# Patient Record
Sex: Male | Born: 1955 | Race: White | Hispanic: No | Marital: Single | Smoking: Current every day smoker
Health system: Southern US, Community
[De-identification: ages and names within clinical notes are randomized; demographics above are authoritative.]

## PROBLEM LIST (undated history)

## (undated) DIAGNOSIS — Z87442 Personal history of urinary calculi: Secondary | ICD-10-CM

## (undated) DIAGNOSIS — I255 Ischemic cardiomyopathy: Secondary | ICD-10-CM

## (undated) DIAGNOSIS — E785 Hyperlipidemia, unspecified: Secondary | ICD-10-CM

## (undated) DIAGNOSIS — I10 Essential (primary) hypertension: Secondary | ICD-10-CM

## (undated) DIAGNOSIS — I4901 Ventricular fibrillation: Secondary | ICD-10-CM

## (undated) DIAGNOSIS — I219 Acute myocardial infarction, unspecified: Secondary | ICD-10-CM

## (undated) DIAGNOSIS — D649 Anemia, unspecified: Secondary | ICD-10-CM

## (undated) DIAGNOSIS — I2510 Atherosclerotic heart disease of native coronary artery without angina pectoris: Secondary | ICD-10-CM

## (undated) DIAGNOSIS — Z950 Presence of cardiac pacemaker: Secondary | ICD-10-CM

## (undated) DIAGNOSIS — G4733 Obstructive sleep apnea (adult) (pediatric): Secondary | ICD-10-CM

## (undated) DIAGNOSIS — I5022 Chronic systolic (congestive) heart failure: Secondary | ICD-10-CM

## (undated) DIAGNOSIS — J189 Pneumonia, unspecified organism: Secondary | ICD-10-CM

## (undated) HISTORY — DX: Hyperlipidemia, unspecified: E78.5

## (undated) HISTORY — PX: BACK SURGERY: SHX140

## (undated) HISTORY — DX: Ischemic cardiomyopathy: I25.5

## (undated) HISTORY — DX: Obstructive sleep apnea (adult) (pediatric): G47.33

## (undated) HISTORY — PX: KNEE ARTHROSCOPY: SHX127

## (undated) HISTORY — DX: Ventricular fibrillation: I49.01

## (undated) HISTORY — DX: Chronic systolic (congestive) heart failure: I50.22

## (undated) HISTORY — DX: Atherosclerotic heart disease of native coronary artery without angina pectoris: I25.10

---

## 1997-09-26 ENCOUNTER — Encounter: Admission: RE | Admit: 1997-09-26 | Discharge: 1997-12-25 | Payer: Self-pay | Admitting: Anesthesiology

## 1997-12-06 ENCOUNTER — Encounter: Payer: Self-pay | Admitting: Anesthesiology

## 2002-03-16 ENCOUNTER — Emergency Department (HOSPITAL_COMMUNITY): Admission: EM | Admit: 2002-03-16 | Discharge: 2002-03-16 | Payer: Self-pay | Admitting: Emergency Medicine

## 2002-03-29 ENCOUNTER — Encounter: Admission: RE | Admit: 2002-03-29 | Discharge: 2002-03-29 | Payer: Self-pay | Admitting: Sports Medicine

## 2002-05-30 ENCOUNTER — Encounter: Payer: Self-pay | Admitting: Neurological Surgery

## 2002-05-30 ENCOUNTER — Ambulatory Visit (HOSPITAL_COMMUNITY): Admission: RE | Admit: 2002-05-30 | Discharge: 2002-05-31 | Payer: Self-pay | Admitting: Neurological Surgery

## 2002-06-03 ENCOUNTER — Encounter: Payer: Self-pay | Admitting: Neurological Surgery

## 2002-06-03 ENCOUNTER — Ambulatory Visit (HOSPITAL_COMMUNITY): Admission: RE | Admit: 2002-06-03 | Discharge: 2002-06-03 | Payer: Self-pay | Admitting: Neurological Surgery

## 2002-08-04 ENCOUNTER — Encounter: Admission: RE | Admit: 2002-08-04 | Discharge: 2002-08-04 | Payer: Self-pay | Admitting: Sports Medicine

## 2002-08-04 ENCOUNTER — Encounter: Payer: Self-pay | Admitting: Neurological Surgery

## 2002-08-05 ENCOUNTER — Encounter: Payer: Self-pay | Admitting: Neurological Surgery

## 2002-08-05 ENCOUNTER — Encounter: Admission: RE | Admit: 2002-08-05 | Discharge: 2002-08-05 | Payer: Self-pay | Admitting: Neurological Surgery

## 2008-01-20 ENCOUNTER — Emergency Department (HOSPITAL_BASED_OUTPATIENT_CLINIC_OR_DEPARTMENT_OTHER): Admission: EM | Admit: 2008-01-20 | Discharge: 2008-01-20 | Payer: Self-pay | Admitting: Emergency Medicine

## 2008-12-20 ENCOUNTER — Encounter: Admission: RE | Admit: 2008-12-20 | Discharge: 2008-12-20 | Payer: Self-pay | Admitting: Orthopedic Surgery

## 2009-06-13 ENCOUNTER — Inpatient Hospital Stay (HOSPITAL_COMMUNITY)
Admission: RE | Admit: 2009-06-13 | Discharge: 2009-06-16 | Payer: Self-pay | Source: Home / Self Care | Admitting: Cardiovascular Disease

## 2009-09-04 ENCOUNTER — Ambulatory Visit: Payer: Self-pay | Admitting: Cardiovascular Disease

## 2010-01-13 HISTORY — PX: PACEMAKER INSERTION: SHX728

## 2010-01-24 ENCOUNTER — Encounter: Payer: Self-pay | Admitting: Internal Medicine

## 2010-01-25 ENCOUNTER — Ambulatory Visit: Admission: RE | Admit: 2010-01-25 | Payer: Self-pay | Source: Home / Self Care | Admitting: Cardiology

## 2010-02-22 DIAGNOSIS — R0602 Shortness of breath: Secondary | ICD-10-CM | POA: Insufficient documentation

## 2010-02-25 ENCOUNTER — Institutional Professional Consult (permissible substitution) (INDEPENDENT_AMBULATORY_CARE_PROVIDER_SITE_OTHER): Payer: BC Managed Care – PPO | Admitting: Internal Medicine

## 2010-02-25 ENCOUNTER — Encounter: Payer: Self-pay | Admitting: Internal Medicine

## 2010-02-25 DIAGNOSIS — I2589 Other forms of chronic ischemic heart disease: Secondary | ICD-10-CM

## 2010-02-25 DIAGNOSIS — I5022 Chronic systolic (congestive) heart failure: Secondary | ICD-10-CM

## 2010-03-06 NOTE — Assessment & Plan Note (Signed)
Summary: nep. eval for icd. pt has bcbs advantage.gd/per pt office 275...   Visit Type:  Initial Consult Referring Provider:  Dr Mayford Knife  CC:  New EP evaluation.  History of Present Illness: Joe [name] is a pleasant 55 yo WM with a h/o CAD, ischemic CM (EF 35%), and HTN who presents today for EP consultation regarding risk stratefication of sudde death.  He had acute STEMI with VF arrest requiring defibrillation 6/11.  He was evaluated at [facility] and underwent PCI of the mid LAD.  He has done reasonably well since that time.  He remains active but reports significant decrease in energy.  He reports decreased exercise tolerance with fatigue and inability to walk more than 1/4 mile due to chest tightness and mild SOB in October.  He feels that this is gradually improving.  Presently, he feels that he can walk 3/4 miles.  He has also had difficulty with fluid retention.  He denies CP, orthopnea, PND, edema, presyncope, or syncope.  He reports complaince with medicines.  He works as a Curator.   Problems Prior to Update: 1)  Hyperlipidemia-mixed  (ICD-272.4) 2)  Cardiomyopathy, Ischemic  (ICD-414.8) 3)  Dyspnea  (ICD-786.05) 4)  Cad, Native Vessel  (ICD-414.01)  Current Medications (verified): 1)  Aspirin Ec 325 Mg Tbec (Aspirin) .... Take One Tablet By Mouth Daily 2)  Lisinopril 5 Mg Tabs (Lisinopril) .... Take One Tablet By Mouth Daily 3)  Effient 10 Mg Tabs (Prasugrel Hcl) .... Take One Tablet By Mouth Daily 4)  Crestor 10 Mg Tabs (Rosuvastatin Calcium) .... Take One Tablet By Mouth Daily.  Allergies (verified): No Known Drug Allergies  Past History:  Past Medical History: CAD s/p PCI mild LAD (BMS) VF arrest in setting of AMI 6/11 Ischemic CM NYHA Class III CHF Hyperlipidemia ED tobacco use (quit)  Past Surgical History: back surgery x 2 R knee arthroscopy  Family History: mother died of an "aneurysm"  Social History: Pt lives in [place] alone.  Widowed.  2 grown  children.  Works as a Curator.  Tob- quit 07/13/09 previously 1 PPD x 30 years.  ETOH- 1 6pk/wk.  Denies drug.  Review of Systems       All systems are reviewed and negative except as listed in the HPI.   Vital Signs:  Patient profile:   55 year old male Height:      74 inches Weight:      228.50 pounds BMI:     29.44 Pulse rate:   68 / minute Pulse rhythm:   regular Resp:     18 per minute BP sitting:   126 / 84  (left arm) Cuff size:   large  Vitals Entered By: Vikki Ports (February 25, 2010 3:57 PM)  Physical Exam  General:  Well developed, well nourished, in no acute distress. Head:  normocephalic and atraumatic Eyes:  PERRLA/EOM intact; conjunctiva and lids normal. Mouth:  Teeth, gums and palate normal. Oral mucosa normal. Neck:  supple Lungs:  Clear bilaterally to auscultation and percussion. Heart:  Non-displaced PMI, chest non-tender; regular rate and rhythm, S1, S2 without murmurs, rubs or gallops. Carotid upstroke normal, no bruit. Normal abdominal aortic size, no bruits. Femorals normal pulses, no bruits. Pedals normal pulses. No edema, no varicosities. Abdomen:  Bowel sounds positive; abdomen soft and non-tender without masses, organomegaly, or hernias noted. No hepatosplenomegaly. Msk:  Back normal, normal gait. Muscle strength and tone normal. Extremities:  No clubbing or cyanosis. Neurologic:  Alert and oriented  x 3. Skin:  Intact without lesions or rashes. Psych:  Normal affect.  Cardiac Cath  Procedure date:  06/14/2009  Findings:       CONCLUSIONS:   1. Successful PTCA and stenting of the mid-LAD.   2. He has significant stenosis in the proximal circumflex artery.       This will need to be addressed at a different time.        Vesta Mixer, M.D.    EKG  Procedure date:  02/25/2010  Findings:      sinus rhythm 68 bpm, PR 162, QRS 118, QTc 416, anteroseptal MI  Nuclear Study  Procedure date:  01/25/2010  Findings:      fixed  anteroapical scar, small area of ischemia in setting of LCx, EF 36%  EF-Ejection Fraction  Procedure date:  11/14/2009  Findings:      anteroapical scar, EF 35%  Impression & Recommendations:  Problem # 1:  CARDIOMYOPATHY, ISCHEMIC (ICD-414.8) The patient has an ischemic CM (EF 35% by MUGA 11/14/09), NYHA Class III CHF, and CAD.  He meets MADIT II/ SCD-HeFT criteria for ICD implantation for primary prevention of sudden death.  He has also had a prior VF arrest in the setting of acute MI 6/11.  He is presently treated with an optimal medical regimen.  Risks, benefits, alternatives to ICD implantation were discussed in detail with the patient today.  He understands that the risks include but are not limited to bleeding, infection, pneumothorax, perforation, tamponade, vascular damage, renal failure, MI, stroke, death, inappropriate shocks, and lead dislodgement.  At this time, he would like to further contemplate this option.  He will discuss with Dr Mayford Knife and will contact my office if he wishes to proceed with ICD implantation in the future. If he decides to proceed with ICD, we will consider offering the patient enrollment in the Anaylze ST trial.  He is not a candidate for CRT.  complete ETOH abstinance is advised.  Problem # 2:  CAD, NATIVE VESSEL (ICD-414.01) no ischemic symptoms  Problem # 3:  HYPERLIPIDEMIA-MIXED (ICD-272.4) stable  His updated medication list for this problem includes:    Crestor 10 Mg Tabs (Rosuvastatin calcium) .Marland Kitchen... Take one tablet by mouth daily.  Other Orders: EKG w/ Interpretation (93000)

## 2010-03-26 NOTE — Letter (Signed)
Summary: [facility] 2010-2012  [facility] 2010-2012   Imported By: Marylou Mccoy 03/19/2010 12:06:27  _____________________________________________________________________  External Attachment:    Type:   Image     Comment:   External Document

## 2010-04-01 LAB — DIFFERENTIAL
Lymphs Abs: 1.4 10*3/uL (ref 0.7–4.0)
Monocytes Relative: 4 % (ref 3–12)
Neutro Abs: 12.7 10*3/uL — ABNORMAL HIGH (ref 1.7–7.7)
Neutrophils Relative %: 86 % — ABNORMAL HIGH (ref 43–77)

## 2010-04-01 LAB — CARDIAC PANEL(CRET KIN+CKTOT+MB+TROPI)
CK, MB: 278.5 ng/mL (ref 0.3–4.0)
Relative Index: 11.2 — ABNORMAL HIGH (ref 0.0–2.5)
Troponin I: 82.81 ng/mL (ref 0.00–0.06)

## 2010-04-01 LAB — BASIC METABOLIC PANEL
BUN: 12 mg/dL (ref 6–23)
Chloride: 104 mEq/L (ref 96–112)
Glucose, Bld: 109 mg/dL — ABNORMAL HIGH (ref 70–99)
Potassium: 4 mEq/L (ref 3.5–5.1)

## 2010-04-01 LAB — POCT I-STAT, CHEM 8
BUN: 14 mg/dL (ref 6–23)
Calcium, Ion: 1.12 mmol/L (ref 1.12–1.32)
Chloride: 102 mEq/L (ref 96–112)
Creatinine, Ser: 0.7 mg/dL (ref 0.4–1.5)
Glucose, Bld: 210 mg/dL — ABNORMAL HIGH (ref 70–99)
HCT: 44 % (ref 39.0–52.0)
Hemoglobin: 15 g/dL (ref 13.0–17.0)
Potassium: 2.9 mEq/L — ABNORMAL LOW (ref 3.5–5.1)
Sodium: 136 mEq/L (ref 135–145)
TCO2: 20 mmol/L (ref 0–100)

## 2010-04-01 LAB — CBC
Hemoglobin: 15.9 g/dL (ref 13.0–17.0)
MCHC: 34.4 g/dL (ref 30.0–36.0)
MCHC: 35 g/dL (ref 30.0–36.0)
MCV: 93.4 fL (ref 78.0–100.0)
RBC: 4.52 MIL/uL (ref 4.22–5.81)
RBC: 4.97 MIL/uL (ref 4.22–5.81)
RDW: 12.5 % (ref 11.5–15.5)
RDW: 12.6 % (ref 11.5–15.5)

## 2010-04-01 LAB — APTT: aPTT: 83 seconds — ABNORMAL HIGH (ref 24–37)

## 2010-04-01 LAB — COMPREHENSIVE METABOLIC PANEL
BUN: 14 mg/dL (ref 6–23)
Calcium: 8.2 mg/dL — ABNORMAL LOW (ref 8.4–10.5)
Glucose, Bld: 215 mg/dL — ABNORMAL HIGH (ref 70–99)
Sodium: 137 mEq/L (ref 135–145)
Total Protein: 5.6 g/dL — ABNORMAL LOW (ref 6.0–8.3)

## 2010-04-01 LAB — LIPID PANEL
Cholesterol: 219 mg/dL — ABNORMAL HIGH (ref 0–200)
Total CHOL/HDL Ratio: 5.2 RATIO
VLDL: 45 mg/dL — ABNORMAL HIGH (ref 0–40)

## 2010-04-01 LAB — CK TOTAL AND CKMB (NOT AT ARMC)
CK, MB: 56.6 ng/mL (ref 0.3–4.0)
Total CK: 1001 U/L — ABNORMAL HIGH (ref 7–232)

## 2010-04-01 LAB — TSH: TSH: 0.504 u[IU]/mL (ref 0.350–4.500)

## 2010-04-01 LAB — MRSA PCR SCREENING: MRSA by PCR: NEGATIVE

## 2010-04-01 LAB — PROTIME-INR: Prothrombin Time: 27.2 seconds — ABNORMAL HIGH (ref 11.6–15.2)

## 2010-05-31 NOTE — Op Note (Signed)
NAME:  Joe [name], Joe [name]                     ACCOUNT NO.:  [id number]   MEDICAL RECORD NO.:  [id number]                   PATIENT TYPE:  OIB   LOCATION:  2889                                 FACILITY:  [facility]   PHYSICIAN:  Stefani Dama, M.D.               DATE OF BIRTH:  July 03, 1955   DATE OF PROCEDURE:  05/30/2002  DATE OF DISCHARGE:                                 OPERATIVE REPORT   PREOPERATIVE DIAGNOSES:  Herniated nucleus pulposus L5-S1 right with right  lumbar radiculopathy.   POSTOPERATIVE DIAGNOSES:  Herniated nucleus pulposus L5-S1 right with right  lumbar radiculopathy.   PROCEDURE:  Microendoscopic diskectomy on right L5-S1 with operating  microscope, Metrex microdissection technique.   SURGEON:  Stefani Dama, M.D.   FIRST ASSISTANT:  Hilda Lias, M.D.   ANESTHESIA:  General endotracheal.   INDICATIONS:  The patient is a 55 year old individual who has had  significant back and right lower extremity pain.  The patient notes that he  has had increasing pain over the past six months' time without relief  despite conservative management including physical therapy, epidural steroid  injections, and nonsteroidal anti-inflammatories.  He also presented to the  office with a foot drop on that right foot.  He was advised regarding  surgical decompression of this lesion at the L5-S1 level with a disk  fragment above the level of the L5 vertebral body which appears to be  affecting the L5 nerve root in its exit foramen.  He is taken to the  operating room.   DESCRIPTION OF PROCEDURE:  The patient was brought to the operating room  supine on the stretcher.  After smooth induction of general endotracheal  anesthesia, he was turned prone, and the back was shaved, prepped with  DuraPrep and draped in a sterile fashion.  A K-wire was used to localize the  laminar arch of L5, and then using a wanding technique, a series of dilators  were passed over the arch, and the  dissection was carried down to place a 5-  cm deep x 18-mm wide endoscopic cannula over the L5-S1 interspace on the  right side.  The patient then had the tube fixed to the operating table with  a clamp.  The microscope was draped and brought into the field, and then  with monopolar cautery, the superficial tissues over the laminar arch were  dissected free.  A laminotomy was then created by removing the inferior  margin of the lamina of L5 out to the mesial wall of the facet.  The yellow  ligament was taken up, and the underlying dura was identified.  At the  lateral aspect just underneath the edge of the dura, the S1 nerve root could  be identified being tented dorsally.  This was dissected medially and  underlying this was a large fragment of disk material.  This was removed in  a singular piece. Several other fragments of disk  were noted to be trapped  more cephalad.  This was evidently causing some compression in the L5 nerve  root region.  This was decompressed in a piecemeal fashion using a  combination of micro-pituitaries and 2-mm Kerrison punches with a  microdissection technique and cautery of the epidural veins in this region.  In the end, the area was decompressed well.  The dissection was then carried  inferiorly.  Here, the ligament over the interspace was noted to be bowing  posteriorly.  However, it was solid and intact.  The superior aspect had a  small aperture from the disk space, and this was palpated free and clear of  any loose disk material.  Because the nature of the bulge posteriorly was  very small, it was felt that the disk space should not be opened, and this  was left intact.  The area was then checked for hemostasis in the soft  tissues.  The common dural tube, the L5, and the S1 nerve roots were noted  to be well decompressed on that right side.  With good hemostasis being  obtained, the area was copiously irrigated with antibiotic irrigating  solution, and  then the cannula was removed, and 3-0 Vicryl was placed in the  lumbodorsal fascia, and 3-0 Vicryl was used to close the subcuticular skin.  Dermabond was placed on the skin.  The patient tolerated the procedure well  and was returned to the recovery room in stable condition.                                               Stefani Dama, M.D.    Merla Riches  D:  05/30/2002  T:  05/30/2002  Job:  [id number]

## 2010-07-26 ENCOUNTER — Other Ambulatory Visit: Payer: Self-pay | Admitting: Cardiovascular Disease

## 2010-07-26 NOTE — Telephone Encounter (Signed)
Needs yearly ov, placed on script Alfonso Ramus RN

## 2010-10-11 ENCOUNTER — Other Ambulatory Visit: Payer: Self-pay | Admitting: *Deleted

## 2010-10-11 MED ORDER — ROSUVASTATIN CALCIUM 20 MG PO TABS: 20.0000 mg | ORAL_TABLET | Freq: Every day | ORAL | Status: DC

## 2010-10-11 NOTE — Telephone Encounter (Signed)
WILL NEED OV,

## 2010-11-05 ENCOUNTER — Other Ambulatory Visit: Payer: Self-pay | Admitting: Internal Medicine

## 2010-11-05 MED ORDER — ROSUVASTATIN CALCIUM 20 MG PO TABS: 20.0000 mg | ORAL_TABLET | Freq: Every day | ORAL | Status: DC

## 2011-01-08 ENCOUNTER — Encounter: Payer: Self-pay | Admitting: *Deleted

## 2011-01-08 ENCOUNTER — Encounter (HOSPITAL_COMMUNITY): Payer: Self-pay | Admitting: Pharmacy Technician

## 2011-01-08 ENCOUNTER — Ambulatory Visit (INDEPENDENT_AMBULATORY_CARE_PROVIDER_SITE_OTHER): Payer: BC Managed Care – PPO | Admitting: Internal Medicine

## 2011-01-08 VITALS — BP 138/88 | HR 78 | Ht 74.0 in | Wt 240.0 lb

## 2011-01-08 DIAGNOSIS — I5022 Chronic systolic (congestive) heart failure: Secondary | ICD-10-CM

## 2011-01-08 DIAGNOSIS — I2589 Other forms of chronic ischemic heart disease: Secondary | ICD-10-CM

## 2011-01-08 LAB — CBC WITH DIFFERENTIAL/PLATELET
Basophils Relative: 1 % (ref 0–1)
Eosinophils Absolute: 0.3 10*3/uL (ref 0.0–0.7)
Eosinophils Relative: 3 % (ref 0–5)
Hemoglobin: 16.4 g/dL (ref 13.0–17.0)
Lymphs Abs: 4.2 10*3/uL — ABNORMAL HIGH (ref 0.7–4.0)
MCH: 31.4 pg (ref 26.0–34.0)
MCHC: 34.6 g/dL (ref 30.0–36.0)
MCV: 90.8 fL (ref 78.0–100.0)
Monocytes Absolute: 0.6 10*3/uL (ref 0.1–1.0)
Monocytes Relative: 6 % (ref 3–12)
RBC: 5.22 MIL/uL (ref 4.22–5.81)

## 2011-01-08 LAB — BASIC METABOLIC PANEL
CO2: 24 mEq/L (ref 19–32)
Chloride: 108 mEq/L (ref 96–112)
Potassium: 4.3 mEq/L (ref 3.5–5.3)

## 2011-01-08 NOTE — Assessment & Plan Note (Signed)
He is s/p MI with an EF less than 30% by stress testing. He has a class 1 ICD indication (MADIT2). I have discussed the risks/benefits/goals/expectations of ICD implant with the patient and he wishes to proceed.

## 2011-01-08 NOTE — Progress Notes (Signed)
HPI Joe [name] is referred today for followup. He is a pleasant 55 yo man with a h/o an ICM, chronic systolic heart failure, HTN, and dyslipidemia. He is referred today for consideration for prophylactic ICD implant. His initial MI was over a year ago. His initial EF after MI was 35%. His subsequent EF on stress testing several weeks ago was 28%. He denies syncope and has never had peripheral edema. When he presented with his initial MI, he had a VF arrest. He has rare palpitations. No Known Allergies   Current Outpatient Prescriptions  Medication Sig Dispense Refill  . aspirin 81 MG tablet Take 81 mg by mouth daily.        . carvedilol (COREG) 6.25 MG tablet Take 6.25 mg by mouth 2 (two) times daily with a meal.        . DIOVAN 80 MG tablet Take 80 mg by mouth daily.       . prasugrel (EFFIENT) 10 MG TABS Take 10 mg by mouth daily.        . rosuvastatin (CRESTOR) 20 MG tablet Take 1 tablet (20 mg total) by mouth daily.  30 tablet  1     Past Medical History  Diagnosis Date  . Coronary artery disease     s/p PCI mild LAD (BMS)  . Hyperlipidemia   . Ischemic cardiomyopathy   . Congestive heart failure, NYHA class III   . VF (ventricular fibrillation)     arrest in setting of AMI 6/11    ROS:   All systems reviewed and negative except as noted in the HPI.   Past Surgical History  Procedure Date  . Back surgery   . Back surgery   . Knee arthroscopy     right     Family History  Problem Relation Age of Onset  . Aneurysm Mother      History   Social History  . Marital Status: Single    Spouse Name: N/A    Number of Children: N/A  . Years of Education: N/A   Occupational History  . Not on file.   Social History Main Topics  . Smoking status: Former Smoker -- 1.0 packs/day for 34 years    Types: Cigarettes    Quit date: 06/13/2009  . Smokeless tobacco: Never Used  . Alcohol Use: Not on file  . Drug Use: Not on file  . Sexually Active: Not on file   Other  Topics Concern  . Not on file   Social History Narrative  . No narrative on file     BP 138/88  Pulse 78  Ht 6' 2" (1.88 m)  Wt 108.863 kg (240 lb)  BMI 30.81 kg/m2  Physical Exam:  Well appearing middle age man, NAD HEENT: Unremarkable Neck:  No JVD, no thyromegally Lymphatics:  No adenopathy Back:  No CVA tenderness Lungs:  Clear with no wheezes, rales, or rhonchi. HEART:  Regular rate rhythm, no murmurs, no rubs, no clicks Abd:  soft, positive bowel sounds, no organomegally, no rebound, no guarding Ext:  2 plus pulses, no edema, no cyanosis, no clubbing Skin:  No rashes no nodules Neuro:  CN II through XII intact, motor grossly intact  EKG NSR with infero-anteroseptal MI.  Assess/Plan:   

## 2011-01-08 NOTE — Patient Instructions (Signed)

## 2011-01-09 ENCOUNTER — Encounter (HOSPITAL_COMMUNITY): Payer: Self-pay | Admitting: *Deleted

## 2011-01-09 ENCOUNTER — Other Ambulatory Visit: Payer: Self-pay | Admitting: *Deleted

## 2011-01-09 ENCOUNTER — Encounter (HOSPITAL_COMMUNITY)
Admission: RE | Disposition: A | Discharge status: Home / Self Care | Payer: Self-pay | Source: Ambulatory Visit | Attending: Internal Medicine

## 2011-01-09 ENCOUNTER — Ambulatory Visit (HOSPITAL_COMMUNITY)
Admission: RE | Admit: 2011-01-09 | Discharge: 2011-01-10 | Disposition: A | Discharge status: Home / Self Care | Payer: BC Managed Care – PPO | Source: Ambulatory Visit | Attending: Internal Medicine | Admitting: Internal Medicine

## 2011-01-09 DIAGNOSIS — I252 Old myocardial infarction: Secondary | ICD-10-CM | POA: Insufficient documentation

## 2011-01-09 DIAGNOSIS — I5022 Chronic systolic (congestive) heart failure: Secondary | ICD-10-CM | POA: Insufficient documentation

## 2011-01-09 DIAGNOSIS — I428 Other cardiomyopathies: Secondary | ICD-10-CM

## 2011-01-09 DIAGNOSIS — I2589 Other forms of chronic ischemic heart disease: Secondary | ICD-10-CM

## 2011-01-09 DIAGNOSIS — I4891 Unspecified atrial fibrillation: Secondary | ICD-10-CM | POA: Insufficient documentation

## 2011-01-09 DIAGNOSIS — Z9861 Coronary angioplasty status: Secondary | ICD-10-CM | POA: Insufficient documentation

## 2011-01-09 DIAGNOSIS — I2510 Atherosclerotic heart disease of native coronary artery without angina pectoris: Secondary | ICD-10-CM | POA: Insufficient documentation

## 2011-01-09 DIAGNOSIS — I509 Heart failure, unspecified: Secondary | ICD-10-CM | POA: Insufficient documentation

## 2011-01-09 HISTORY — PX: IMPLANTABLE CARDIOVERTER DEFIBRILLATOR IMPLANT: SHX5473

## 2011-01-09 LAB — SURGICAL PCR SCREEN: MRSA, PCR: NEGATIVE

## 2011-01-09 SURGERY — IMPLANTABLE CARDIOVERTER DEFIBRILLATOR IMPLANT
Anesthesia: LOCAL

## 2011-01-09 MED ORDER — MUPIROCIN 2 % EX OINT
TOPICAL_OINTMENT | Freq: Two times a day (BID) | CUTANEOUS | Status: DC
  Administered 2011-01-09: 1 via NASAL

## 2011-01-09 MED ORDER — MIDAZOLAM HCL 5 MG/5ML IJ SOLN
INTRAMUSCULAR | Status: AC
  Filled 2011-01-09: qty 5

## 2011-01-09 MED ORDER — HEPARIN (PORCINE) IN NACL 2-0.9 UNIT/ML-% IJ SOLN
INTRAMUSCULAR | Status: AC
  Filled 2011-01-09: qty 1000

## 2011-01-09 MED ORDER — PRASUGREL HCL 10 MG PO TABS
10.0000 mg | ORAL_TABLET | Freq: Every day | ORAL | Status: DC
  Administered 2011-01-09: 10 mg via ORAL
  Filled 2011-01-09 (×2): qty 1

## 2011-01-09 MED ORDER — SODIUM CHLORIDE 0.45 % IV SOLN
INTRAVENOUS | Status: DC
  Administered 2011-01-09: 08:00:00 via INTRAVENOUS

## 2011-01-09 MED ORDER — CARVEDILOL 6.25 MG PO TABS
6.2500 mg | ORAL_TABLET | Freq: Two times a day (BID) | ORAL | Status: DC
  Administered 2011-01-09 – 2011-01-10 (×2): 6.25 mg via ORAL
  Filled 2011-01-09 (×4): qty 1

## 2011-01-09 MED ORDER — ASPIRIN EC 81 MG PO TBEC
81.0000 mg | DELAYED_RELEASE_TABLET | Freq: Every day | ORAL | Status: DC
  Administered 2011-01-09 – 2011-01-10 (×2): 81 mg via ORAL
  Filled 2011-01-09 (×2): qty 1

## 2011-01-09 MED ORDER — CEFAZOLIN SODIUM-DEXTROSE 2-3 GM-% IV SOLR
2.0000 g | Freq: Three times a day (TID) | INTRAVENOUS | Status: DC
  Administered 2011-01-09 – 2011-01-10 (×3): 2 g via INTRAVENOUS
  Filled 2011-01-09 (×6): qty 50

## 2011-01-09 MED ORDER — OLMESARTAN MEDOXOMIL 20 MG PO TABS
20.0000 mg | ORAL_TABLET | Freq: Every day | ORAL | Status: DC
  Administered 2011-01-09 – 2011-01-10 (×2): 20 mg via ORAL
  Filled 2011-01-09 (×2): qty 1

## 2011-01-09 MED ORDER — SODIUM CHLORIDE 0.9 % IV SOLN: INTRAVENOUS | Status: DC

## 2011-01-09 MED ORDER — ONDANSETRON HCL 4 MG/2ML IJ SOLN: 4.0000 mg | Freq: Four times a day (QID) | INTRAMUSCULAR | Status: DC | PRN

## 2011-01-09 MED ORDER — CEFAZOLIN SODIUM-DEXTROSE 2-3 GM-% IV SOLR: 2.0000 g | INTRAVENOUS | Status: DC

## 2011-01-09 MED ORDER — CEFAZOLIN SODIUM-DEXTROSE 2-3 GM-% IV SOLR
INTRAVENOUS | Status: AC
  Administered 2011-01-09: 2 g via INTRAVENOUS
  Filled 2011-01-09: qty 50

## 2011-01-09 MED ORDER — SODIUM CHLORIDE 0.9 % IV SOLN: 250.0000 mL | INTRAVENOUS | Status: DC

## 2011-01-09 MED ORDER — MUPIROCIN 2 % EX OINT
TOPICAL_OINTMENT | CUTANEOUS | Status: AC
  Filled 2011-01-09: qty 22

## 2011-01-09 MED ORDER — FENTANYL CITRATE 0.05 MG/ML IJ SOLN
INTRAMUSCULAR | Status: AC
  Filled 2011-01-09: qty 2

## 2011-01-09 MED ORDER — ACETAMINOPHEN 325 MG PO TABS
650.0000 mg | ORAL_TABLET | ORAL | Status: DC | PRN
  Administered 2011-01-09: 650 mg via ORAL
  Filled 2011-01-09: qty 2

## 2011-01-09 MED ORDER — SODIUM CHLORIDE 0.9 % IR SOLN
80.0000 mg | Status: DC
  Filled 2011-01-09: qty 2

## 2011-01-09 MED ORDER — LIDOCAINE HCL (PF) 1 % IJ SOLN
INTRAMUSCULAR | Status: AC
  Filled 2011-01-09: qty 60

## 2011-01-09 MED ORDER — CHLORHEXIDINE GLUCONATE 4 % EX LIQD: 60.0000 mL | Freq: Once | CUTANEOUS | Status: DC

## 2011-01-09 MED ORDER — ROSUVASTATIN CALCIUM 20 MG PO TABS
20.0000 mg | ORAL_TABLET | Freq: Every day | ORAL | Status: DC
  Administered 2011-01-09: 20 mg via ORAL
  Filled 2011-01-09 (×2): qty 1

## 2011-01-09 NOTE — Interval H&P Note (Signed)
History and Physical Interval Note:  01/09/2011 10:48 AM  Joe [name]  has presented today for surgery, with the diagnosis of arrythmia  The various methods of treatment have been discussed with the patient and family. After consideration of risks, benefits and other options for treatment, the patient has consented to  Procedure(s): IMPLANTABLE CARDIOVERTER DEFIBRILLATOR IMPLANT as a surgical intervention .  The patients' history has been reviewed, patient examined, no change in status, stable for surgery.  I have reviewed the patients' chart and labs.  Questions were answered to the patient's satisfaction.     [name]

## 2011-01-09 NOTE — H&P (View-Only) (Signed)
HPI Joe [name] is referred today for followup. He is a pleasant 55 yo man with a h/o an ICM, chronic systolic heart failure, HTN, and dyslipidemia. He is referred today for consideration for prophylactic ICD implant. His initial MI was over a year ago. His initial EF after MI was 35%. His subsequent EF on stress testing several weeks ago was 28%. He denies syncope and has never had peripheral edema. When he presented with his initial MI, he had a VF arrest. He has rare palpitations. No Known Allergies   Current Outpatient Prescriptions  Medication Sig Dispense Refill  . aspirin 81 MG tablet Take 81 mg by mouth daily.        . carvedilol (COREG) 6.25 MG tablet Take 6.25 mg by mouth 2 (two) times daily with a meal.        . DIOVAN 80 MG tablet Take 80 mg by mouth daily.       . prasugrel (EFFIENT) 10 MG TABS Take 10 mg by mouth daily.        . rosuvastatin (CRESTOR) 20 MG tablet Take 1 tablet (20 mg total) by mouth daily.  30 tablet  1     Past Medical History  Diagnosis Date  . Coronary artery disease     s/p PCI mild LAD (BMS)  . Hyperlipidemia   . Ischemic cardiomyopathy   . Congestive heart failure, NYHA class III   . VF (ventricular fibrillation)     arrest in setting of AMI 6/11    ROS:   All systems reviewed and negative except as noted in the HPI.   Past Surgical History  Procedure Date  . Back surgery   . Back surgery   . Knee arthroscopy     right     Family History  Problem Relation Age of Onset  . Aneurysm Mother      History   Social History  . Marital Status: Single    Spouse Name: N/A    Number of Children: N/A  . Years of Education: N/A   Occupational History  . Not on file.   Social History Main Topics  . Smoking status: Former Smoker -- 1.0 packs/day for 34 years    Types: Cigarettes    Quit date: 06/13/2009  . Smokeless tobacco: Never Used  . Alcohol Use: Not on file  . Drug Use: Not on file  . Sexually Active: Not on file   Other  Topics Concern  . Not on file   Social History Narrative  . No narrative on file     BP 138/88  Pulse 78  Ht 6\' 2"  (1.88 m)  Wt 108.863 kg (240 lb)  BMI 30.81 kg/m2  Physical Exam:  Well appearing middle age man, NAD HEENT: Unremarkable Neck:  No JVD, no thyromegally Lymphatics:  No adenopathy Back:  No CVA tenderness Lungs:  Clear with no wheezes, rales, or rhonchi. HEART:  Regular rate rhythm, no murmurs, no rubs, no clicks Abd:  soft, positive bowel sounds, no organomegally, no rebound, no guarding Ext:  2 plus pulses, no edema, no cyanosis, no clubbing Skin:  No rashes no nodules Neuro:  CN II through XII intact, motor grossly intact  EKG NSR with infero-anteroseptal MI.  Assess/Plan:

## 2011-01-09 NOTE — Op Note (Signed)
ICD implant via the left subclavian vein without immediate complications. W#[id number].

## 2011-01-09 NOTE — Research (Signed)
ANALYZE ST Informed Consent   Subject Name: Joe [name]  Subject met inclusion and exclusion criteria.  The informed consent form, study requirements and expectations were reviewed with the subject and questions and concerns were addressed prior to the signing of the consent form.  The subject verbalized understanding of the trail requirements.  The subject agreed to participate in the ANALYZE ST trial and signed the informed consent.  The informed consent was obtained prior to performance of any protocol-specific procedures for the subject.  A copy of the signed informed consent was given to the subject and a copy was placed in the subject's medical record.  Brunilda Payor 01/09/2011, 12:39 PM

## 2011-01-10 ENCOUNTER — Encounter (HOSPITAL_COMMUNITY): Payer: Self-pay | Admitting: Nurse Practitioner

## 2011-01-10 ENCOUNTER — Ambulatory Visit (HOSPITAL_COMMUNITY): Payer: BC Managed Care – PPO

## 2011-01-10 NOTE — Discharge Summary (Signed)
ELECTROPHYSIOLOGY PROCEDURE DISCHARGE SUMMARY    Patient ID: Joe [name],  MRN: [id number], DOB/AGE: 53-Dec-1957 53 y.o.  Admit date: 01/09/2011 Discharge date: 01/10/2011  Primary Cardiologist: Joe Decamp, MD Electrophysiologist: Joe Bunting, MD  Primary Discharge Diagnosis:  Ischemic cardiomyopathy status post ICD implantation this admission as part of the AnalyzeST trial.   Secondary Discharge Diagnosis:  1.  Coronary artery disease- status post PCI to the mid LAD (BMS) 2.  Hyperlipidemia 3.  Ischemic cardiomyopathy 4.  VF arrest in setting of AMI in June of 2011  Procedures This Admission: 1.  Implantation of a [employer] ICD with a model number 7122 right ventricular lead.  DFT's at time of implant were less than or equal to 20J.  There were no early apparent complications. 2.  Chest x-ray 01-10-2011 demonstrated no pneumothorax status post device implantation.  Brief HPI: Joe [name] is a 55 year old male with a history of coronary artery disease and ischemic cardiomyopathy despite maximal medical therapy.  He was referred to Dr Joe [name] in the outpatient setting for evaluation of implant of an ICD.  Risks, benefits and alternatives to the procedure were reviewed with the patient and he wished to proceed.  He also consented to the AnalyzeST trial with [facility] Course:  The patient was admitted on 01-09-2011 for planned implantation of an ICD.  This was carried out by Dr Joe [name] with details as outlined above.  He was monitored on telemetry overnight which demonstrated sinus rhythm.  His device was interrogated and found to be functioning normally.  CXR was obtained which demonstrated no pneumothorax status post device implant.  His left chest was without hematoma or ecchymosis.  He was evaluated by Dr Joe [name] and considered to be stable for discharge.  Plans are to hold the Effient until Monday to help limit the amount of pocket bleeding.    Discharge Vitals: Blood pressure 122/75, pulse 67, temperature 97.8 F (36.6 C), temperature source Oral, resp. rate 18, height 6\' 2"  (1.88 m), weight 240 lb (108.863 kg), SpO2 96.00%.    Labs:   Lab Results  Component Value Date   WBC 10.2 01/08/2011   HGB 16.4 01/08/2011   HCT 47.4 01/08/2011   MCV 90.8 01/08/2011   PLT 251 01/08/2011    Lab 01/08/11 1513  NA 143  K 4.3  CL 108  CO2 24  BUN 23  CREATININE 0.99  CALCIUM 9.4  PROT --  BILITOT --  ALKPHOS --  ALT --  AST --  GLUCOSE 99    Discharge Medications: Current Discharge Medication List    CONTINUE these medications which have NOT CHANGED   Details  aspirin 81 MG tablet Take 81 mg by mouth daily.      carvedilol (COREG) 6.25 MG tablet Take 6.25 mg by mouth 2 (two) times daily with a meal.      DIOVAN 80 MG tablet Take 80 mg by mouth daily.     prasugrel (EFFIENT) 10 MG TABS Take 10 mg by mouth daily.      rosuvastatin (CRESTOR) 20 MG tablet Take 1 tablet (20 mg total) by mouth daily. Qty: 30 tablet, Refills: 1        Disposition:  Discharge Orders    Future Appointments: Provider: Department: Dept Phone: Center:   01/23/2011 2:00 PM Vella Kohler [facility] [phone] [facility]     Follow-up Information    Follow up with [facility] on  01/23/2011. (2:00pm)       Follow up with Joe Bunting, MD in 3 months. (we will arrange)    Contact information:   [address] [place] [address] [phone]          Duration of Discharge Encounter: Greater than 30 minutes including physician time.  Signed, Gypsy Balsam, RN, BSN 01/10/2011, 10:44 AM

## 2011-01-10 NOTE — Op Note (Signed)
NAMEMarland Kitchen  EVELYN, MOCH NO.:  [id number]  MEDICAL RECORD NO.:  [id number]  LOCATION:  3738                         FACILITY:  [facility]  PHYSICIAN:  Doylene Canning. Ladona Ridgel, MD    DATE OF BIRTH:  March 09, 1955  DATE OF PROCEDURE:  01/09/2011 DATE OF DISCHARGE:                              OPERATIVE REPORT   PROCEDURE PERFORMED:  Implantable cardioverter-defibrillator implantation with defibrillation threshold testing.  INTRODUCTION:  The patient is a 55 year old man who is status post myocardial infarction over a year ago.  He had an ejection fraction of 28% despite maximal medical therapy.  He is now referred for prophylactic ICD implantation secondary to the MADIT II criteria.  PROCEDURE:  After informed consent was obtained, the patient was taken to the diagnostic EP lab in a fasting state.  After usual preparation and draping, intravenous fentanyl and midazolam was given for sedation. Lidocaine 30 mL was infiltrated into the left infraclavicular region.  A 7 cm incision was carried out over this region and electrocautery was utilized to dissect down to the fascial plane.  The left subclavian vein was punctured and the [employer], model 7122 65-cm active fixation defibrillation lead, serial number [id number] was advanced into the right ventricle.  Mapping was carried out in the right ventricle.  At the final site, R-waves measured 11 mV, the pacing impedance was 700 ohms, and the threshold was a V at 0.5 msec.  A 10 V pacing did not stimulate the diaphragm.  There was a large injury current with active fixation of the lead.  With these satisfactory parameters, the lead was secured to the subpectoral fascia with a figure-of-eight silk suture.  The sewing sleeve was secured with silk suture.  Electrocautery was utilized to make a subcutaneous pocket.  Antibiotic irrigation was utilized to irrigate the pocket, and electrocautery was utilized to assure hemostasis.  The St. Jude  Fortify ST VR single-chamber defibrillator, serial number [id number] was connected to the defibrillation lead and placed back in the subcutaneous pocket.  The pocket was irrigated with antibiotic irrigation, and the incision was closed with 2-0 and 3-0 Vicryl.  At this point, the patient was prepped for ICD testing.  After the patient was more heavily sedated under my direct guidance, I scrubbed out of the procedure at this time.  Ventricular fibrillation was induced with a T-wave shock.  A 20 joules shock was subsequent delivered, which terminated the ventricular fibrillation and restored sinus rhythm.  At this point, the patient was allowed to awaken and returned to the recovery area in satisfactory condition.  COMPLICATIONS:  There were no immediate procedure complications.  RESULTS:  Demonstrate successful implantation of a St. Jude single- chamber defibrillator in a patient with an ischemic cardiomyopathy, chronic systolic heart failure, status post myocardial infarction, ejection fraction 28% despite maximal medical therapy.     Doylene Canning. Ladona Ridgel, MD     GWT/MEDQ  D:  01/09/2011  T:  01/10/2011  Job:  [id number]

## 2011-01-10 NOTE — Progress Notes (Signed)
   ELECTROPHYSIOLOGY ROUNDING NOTE    Patient Name: Joe [name] Date of Encounter: 01-10-2011    SUBJECTIVE:Patient feels well. Status post single chamber ICD implantation 01-09-2011.  No chest pain or shortness of breath.  Some tenderness at device site.  TELEMETRY: Reviewed telemetry pt in sinus rhythm: Filed Vitals:   01/09/11 0755 01/09/11 1608 01/09/11 2100 01/10/11 0500  BP: 131/86 148/95 123/63 122/75  Pulse: 76 65 76 67  Temp: 96.8 F (36 C) 97.5 F (36.4 C) 97.8 F (36.6 C) 97.8 F (36.6 C)  TempSrc: Oral Oral Oral Oral  Resp: 16 20 20 18   Height: 6\' 2"  (1.88 m)     Weight: 240 lb (108.863 kg)     SpO2: 94% 98% 96% 96%    LABS: no labs today    Medications: . aspirin EC  81 mg Oral Daily  . carvedilol  6.25 mg Oral BID WC  . ceFAZolin (ANCEF) IV  2 g Intravenous Q8H  . olmesartan  20 mg Oral Daily  . rosuvastatin  20 mg Oral q1800    Radiology/Studies:  No PTX.  PHYSICAL EXAM Left chest without hematoma or ecchymosis.    DEVICE INTERROGATION: Device interrogated by industry.  Lead values including impedence, sensing, threshold within normal values.    Will hold Effient and resume on Monday.  Discharge plans - usual followup and recommendations.   Signed, [name], M.D. ,01/10/2011 7:58 AM

## 2011-01-13 ENCOUNTER — Other Ambulatory Visit: Payer: Self-pay | Admitting: Internal Medicine

## 2011-01-13 MED ORDER — ROSUVASTATIN CALCIUM 20 MG PO TABS: 20.0000 mg | ORAL_TABLET | Freq: Every day | ORAL | Status: DC

## 2011-01-15 ENCOUNTER — Telehealth: Payer: Self-pay | Admitting: *Deleted

## 2011-01-15 NOTE — Telephone Encounter (Signed)
We had to call patient to re-schedule wound check appointment to 01/27/11 at 11:00. Patient told me wound looked okay. I told him if he had any drainage or fever form wound to notify the office. I told patient we would remove steri-strips at wound visit. I told pt okay to take shower but keep wound dry.

## 2011-01-23 ENCOUNTER — Ambulatory Visit: Payer: BC Managed Care – PPO | Admitting: *Deleted

## 2011-01-27 ENCOUNTER — Ambulatory Visit (INDEPENDENT_AMBULATORY_CARE_PROVIDER_SITE_OTHER): Payer: BC Managed Care – PPO | Admitting: *Deleted

## 2011-01-27 ENCOUNTER — Encounter: Payer: Self-pay | Admitting: Internal Medicine

## 2011-01-27 DIAGNOSIS — I2589 Other forms of chronic ischemic heart disease: Secondary | ICD-10-CM

## 2011-01-27 LAB — ICD DEVICE OBSERVATION
DEVICE MODEL ICD: 1037513
FVT: 0
HV IMPEDENCE: 61 Ohm
PACEART VT: 0
RV LEAD IMPEDENCE ICD: 450 Ohm
RV LEAD THRESHOLD: 0.75 V
TOT-0008: 0

## 2011-01-27 NOTE — Progress Notes (Signed)
Wound check ICD with industry for research 

## 2011-02-21 ENCOUNTER — Ambulatory Visit (INDEPENDENT_AMBULATORY_CARE_PROVIDER_SITE_OTHER): Payer: BC Managed Care – PPO | Admitting: *Deleted

## 2011-02-21 DIAGNOSIS — I428 Other cardiomyopathies: Secondary | ICD-10-CM

## 2011-02-21 DIAGNOSIS — R0989 Other specified symptoms and signs involving the circulatory and respiratory systems: Secondary | ICD-10-CM

## 2011-02-21 NOTE — Progress Notes (Signed)
Stitch removed

## 2011-03-14 ENCOUNTER — Ambulatory Visit (INDEPENDENT_AMBULATORY_CARE_PROVIDER_SITE_OTHER): Payer: BC Managed Care – PPO | Admitting: *Deleted

## 2011-03-14 DIAGNOSIS — R0989 Other specified symptoms and signs involving the circulatory and respiratory systems: Secondary | ICD-10-CM

## 2011-03-14 DIAGNOSIS — I428 Other cardiomyopathies: Secondary | ICD-10-CM

## 2011-03-14 NOTE — Progress Notes (Signed)
Pt in clinic today with questions about site. ?stitch sticking out. Site real sore as well. Dr Johney Frame evaluated and answered all of pt's questions. No stitch. Soreness coming from header of device. Pt to schedule 3 mth appointment with Dr Ladona Ridgel. Call with any other questions.

## 2011-03-21 ENCOUNTER — Ambulatory Visit (INDEPENDENT_AMBULATORY_CARE_PROVIDER_SITE_OTHER): Payer: BC Managed Care – PPO | Admitting: Internal Medicine

## 2011-03-21 ENCOUNTER — Encounter: Payer: Self-pay | Admitting: Internal Medicine

## 2011-03-21 ENCOUNTER — Other Ambulatory Visit: Payer: Self-pay | Admitting: Cardiovascular Disease

## 2011-03-21 DIAGNOSIS — T827XXA Infection and inflammatory reaction due to other cardiac and vascular devices, implants and grafts, initial encounter: Secondary | ICD-10-CM | POA: Insufficient documentation

## 2011-03-21 DIAGNOSIS — I2589 Other forms of chronic ischemic heart disease: Secondary | ICD-10-CM

## 2011-03-21 DIAGNOSIS — Z95810 Presence of automatic (implantable) cardiac defibrillator: Secondary | ICD-10-CM

## 2011-03-21 NOTE — Progress Notes (Signed)
  HPI  Joe [name] is a 56 y.o. male Seen b./c of concerns regarding ICD   Device implanted 12/12 for primary prevention in setting of ischemic cardiomyopathy'  Past Medical History  Diagnosis Date  . Coronary artery disease     s/p PCI mild LAD (BMS)  . Hyperlipidemia   . Ischemic cardiomyopathy     A. 01/09/2011 - s/p St. Jude Fortify ST VR [place] AICD  . Congestive heart failure, NYHA class III   . VF (ventricular fibrillation)     arrest in setting of AMI 6/11    Past Surgical History  Procedure Date  . Back surgery   . Back surgery   . Knee arthroscopy     right    Current Outpatient Prescriptions  Medication Sig Dispense Refill  . aspirin 81 MG tablet Take 81 mg by mouth daily.        . carvedilol (COREG) 6.25 MG tablet Take 6.25 mg by mouth 2 (two) times daily with a meal.        . DIOVAN 80 MG tablet Take 80 mg by mouth daily.       . fish oil-omega-3 fatty acids 1000 MG capsule Take 2 g by mouth daily.      . prasugrel (EFFIENT) 10 MG TABS Take 10 mg by mouth daily.        . rosuvastatin (CRESTOR) 20 MG tablet Take 1 tablet (20 mg total) by mouth daily.  30 tablet  11    No Known Allergies  Review of Systems negative except from HPI and PMH  Physical Exam Ht 6\' 2"  (1.88 m)  Wt 231 lb (104.781 kg)  BMI 29.66 kg/m2 Well developed and well nourished in no acute distress The suture line is well healed. No retained sutures are noted but there was a local white spot on the medial aspect of the wound which I explored and admitted a small remnant of suture there. There is no erythema. There is tenderness over the anchoring sleeve and when he moves his arm medially across his chest he has burning on the lateral aspect of the device. The device in situ migrated cephalad and had the incision overlies the midportion of the long axis of the device. He does not reposition manually.  Assessment and  Plan

## 2011-03-21 NOTE — Assessment & Plan Note (Signed)
I do not see evidence of infection. There is no evidence of a fluid collection. I suspect that this is related to healing in a fairly muscular guy. The cephalad migration of the device has occluded in a possibly obstructing position. If symptoms don't resolve, I mentioned that the device could be repositioned and perhaps anchored at a more caudal location. This would not address some of the discomfort associated with the anchoring sleeve.

## 2011-03-21 NOTE — Telephone Encounter (Signed)
Fax Received. Refill Completed. Joe [name] (R.M.A)   

## 2011-04-09 ENCOUNTER — Ambulatory Visit (INDEPENDENT_AMBULATORY_CARE_PROVIDER_SITE_OTHER): Payer: BC Managed Care – PPO | Admitting: Internal Medicine

## 2011-04-09 ENCOUNTER — Encounter: Payer: Self-pay | Admitting: Internal Medicine

## 2011-04-09 VITALS — BP 110/52 | HR 74 | Ht 75.0 in | Wt 231.4 lb

## 2011-04-09 DIAGNOSIS — Z95810 Presence of automatic (implantable) cardiac defibrillator: Secondary | ICD-10-CM

## 2011-04-09 DIAGNOSIS — I2589 Other forms of chronic ischemic heart disease: Secondary | ICD-10-CM

## 2011-04-09 NOTE — Patient Instructions (Addendum)
Your physician wants you to follow-up in: 3 months in the device clinic and 6 months with Dr Taylor You will receive a reminder letter in the mail two months in advance. If you don't receive a letter, please call our office to schedule the follow-up appointment.    

## 2011-04-09 NOTE — Assessment & Plan Note (Signed)
He denies anginal symptoms. He will continue his current medical therapy. I have encouraged him to increase his physical activity. 

## 2011-04-09 NOTE — Assessment & Plan Note (Signed)
The patient has residual tenderness over his ICD insertion site of unclear etiology. There is no obvious evidence of infection. I recommended that he use nonsteroidals as well as Tylenol as needed for pain control. Usually these incisions improve as far as pain control over time.

## 2011-04-09 NOTE — Progress Notes (Signed)
HPI [name] returns today for followup. He is a 56 year old man with an ischemic cardiomyopathy, chronic systolic heart failure, status post ICD implantation. The patient complains of pain over his ICD insertion site. He is now 3 months out. His pain has been persistent. There is no fever or chill. He has some tenderness at the superior and medial border of the insertion site. He denies any redness around this area. No syncope, no chest pain, no shortness of breath, and no peripheral edema. No Known Allergies   Current Outpatient Prescriptions  Medication Sig Dispense Refill  . aspirin 81 MG tablet Take 81 mg by mouth daily.        . carvedilol (COREG) 6.25 MG tablet Take 6.25 mg by mouth 2 (two) times daily with a meal.        . DIOVAN 80 MG tablet Take 80 mg by mouth daily.       . fenofibrate 160 MG tablet Take 160 mg by mouth Daily.      . fish oil-omega-3 fatty acids 1000 MG capsule Take 4 g by mouth daily.       Marland Kitchen NITROSTAT 0.4 MG SL tablet TAKE AS DIRECTED AS NEEDED  25 tablet  3  . rosuvastatin (CRESTOR) 20 MG tablet Take 1 tablet (20 mg total) by mouth daily.  30 tablet  11     Past Medical History  Diagnosis Date  . Coronary artery disease     s/p PCI mild LAD (BMS)  . Hyperlipidemia   . Ischemic cardiomyopathy     A. 01/09/2011 - s/p St. Jude Fortify ST VR [place] AICD  . Congestive heart failure, NYHA class III   . VF (ventricular fibrillation)     arrest in setting of AMI 6/11    ROS:   All systems reviewed and negative except as noted in the HPI.   Past Surgical History  Procedure Date  . Back surgery   . Back surgery   . Knee arthroscopy     right     Family History  Problem Relation Age of Onset  . Aneurysm Mother      History   Social History  . Marital Status: Single    Spouse Name: N/A    Number of Children: N/A  . Years of Education: N/A   Occupational History  . Not on file.   Social History Main Topics  . Smoking status: Former  Smoker -- 1.0 packs/day for 34 years    Types: Cigarettes    Quit date: 06/13/2009  . Smokeless tobacco: Never Used  . Alcohol Use: 2.4 oz/week    4 Cans of beer per week  . Drug Use: No  . Sexually Active: Yes   Other Topics Concern  . Not on file   Social History Narrative  . No narrative on file     BP 110/52  Pulse 74  Ht 6\' 3"  (1.905 m)  Wt 104.962 kg (231 lb 6.4 oz)  BMI 28.92 kg/m2  Physical Exam:  Well appearing middle-aged man, NAD HEENT: Unremarkable Neck:  No JVD, no thyromegall Lungs:  Clear with no wheezes, rales, or rhonchi. Well-healed ICD incision with no hematoma, erythema, or effusion. HEART:  Regular rate rhythm, no murmurs, no rubs, no clicks Abd:  soft, positive bowel sounds, no organomegally, no rebound, no guarding Ext:  2 plus pulses, no edema, no cyanosis, no clubbing Skin:  No rashes no nodules Neuro:  CN II through XII intact, motor grossly intact  DEVICE  Normal device function.  See PaceArt for details.   Assess/Plan:

## 2011-05-21 ENCOUNTER — Telehealth: Payer: Self-pay | Admitting: *Deleted

## 2011-05-21 NOTE — Telephone Encounter (Signed)
lmom for pt to call me back in regards to follow up with Dr Ladona Ridgel

## 2011-05-22 NOTE — Telephone Encounter (Signed)
Fu call °Pt returning your call  °

## 2011-05-22 NOTE — Telephone Encounter (Signed)
Called patient-- for 5 months he has been hurting. Dr Duard Brady [phone]--- Marilynne Drivers  We are going to try and get records from [facility] and I told the patient I would call him and let him know what I found out

## 2011-05-23 NOTE — Telephone Encounter (Signed)
Kim from medical records spoke with pt on 05/22/11 and he was to come by and sign medical release.   As of 3:30pm the pt is still not come

## 2011-05-28 ENCOUNTER — Encounter: Payer: Self-pay | Admitting: Internal Medicine

## 2011-05-28 ENCOUNTER — Ambulatory Visit (INDEPENDENT_AMBULATORY_CARE_PROVIDER_SITE_OTHER): Payer: BC Managed Care – PPO | Admitting: *Deleted

## 2011-05-28 ENCOUNTER — Telehealth: Payer: Self-pay | Admitting: Internal Medicine

## 2011-05-28 DIAGNOSIS — I428 Other cardiomyopathies: Secondary | ICD-10-CM

## 2011-05-28 LAB — ICD DEVICE OBSERVATION
BRDY-0002RV: 40 {beats}/min
DEV-0020ICD: NEGATIVE
DEVICE MODEL ICD: 1037513
TOT-0007: 1
TOT-0009: 0
TOT-0010: 3
TZON-0003SLOWVT: 350 ms
TZON-0005SLOWVT: 6
VENTRICULAR PACING ICD: 0 pct

## 2011-05-28 NOTE — Progress Notes (Signed)
defib check in clinic/research

## 2011-05-28 NOTE — Telephone Encounter (Signed)
Patient was here today and signed a release.  We are waiting for films and notes from [facility]

## 2011-05-28 NOTE — Telephone Encounter (Signed)
Release from faxed to [facility] on Wed. 15, 2013 .... Asked for Film and records to be Overnight and ASAP .Marland Kitchen...  05/28/11 TK

## 2011-06-05 NOTE — Telephone Encounter (Signed)
[facility] MR/NADRE asked her the Status of the Release that was faxed over on  05/28/11 for this Pt, Also called [name] @ [phone] LMOVM @ 10:30am asking them to Copy and Overnight the Last Chest On Film/CD, Tresa Endo has Called me this morning asking for the Status of this. 06/05/11/KM

## 2011-06-05 NOTE — Telephone Encounter (Signed)
As of today we had not received his records or CXR film.  Kim in MR called and they are overnighting the films and faxing the note

## 2011-06-05 NOTE — Telephone Encounter (Signed)
Burna Mortimer from Radiology Dept called me back,Gave her Our Address,UPS #  To have CD of Chest Next Day Air to Medical Records Dept.Once Received give to [facility] 06/05/11/KM

## 2011-06-06 NOTE — Telephone Encounter (Signed)
CD of CT received from [facility], will Hold For [place] She will be back In to office Tuesday 06/10/11 06/06/11/KM

## 2011-06-06 NOTE — Telephone Encounter (Signed)
Received OV Note from [facility], Will hold onto until  Tuesday for [facility]  06/06/11/KM

## 2011-06-16 NOTE — Telephone Encounter (Signed)
Patient has follow up with Dr Ladona Ridgel on 06/19/10

## 2011-06-17 ENCOUNTER — Encounter: Payer: Self-pay | Admitting: *Deleted

## 2011-06-18 ENCOUNTER — Encounter: Payer: Self-pay | Admitting: Internal Medicine

## 2011-06-18 ENCOUNTER — Ambulatory Visit (INDEPENDENT_AMBULATORY_CARE_PROVIDER_SITE_OTHER): Payer: BC Managed Care – PPO | Admitting: Internal Medicine

## 2011-06-18 VITALS — BP 122/76 | HR 68 | Ht 74.0 in | Wt 224.4 lb

## 2011-06-18 DIAGNOSIS — R079 Chest pain, unspecified: Secondary | ICD-10-CM

## 2011-06-18 DIAGNOSIS — Z95810 Presence of automatic (implantable) cardiac defibrillator: Secondary | ICD-10-CM

## 2011-06-18 DIAGNOSIS — I2589 Other forms of chronic ischemic heart disease: Secondary | ICD-10-CM

## 2011-06-18 NOTE — Progress Notes (Signed)
HPI Joe [name] returns today for followup. He is a 56 year old man with chronic systolic heart failure and an ischemic cardiomyopathy, status post ICD implantation. Since his implant, he has had chronic pain at the incision site. He denies fevers chills and has had no erythema or fluctuance at the site. Because of ongoing pain he consulted Dr. Orson Aloe at [facility]. A chest x-ray demonstrates that his there were lead has a loop at and appears to be extending medially. It is presumed that this is the cause of his symptoms. His device is been now in 6 months. The pain that he has experienced has improved though still present. No Known Allergies   Current Outpatient Prescriptions  Medication Sig Dispense Refill  . aspirin 81 MG tablet Take 81 mg by mouth daily.        . carvedilol (COREG) 6.25 MG tablet Take 6.25 mg by mouth 2 (two) times daily with a meal.        . DIOVAN 80 MG tablet Take 80 mg by mouth daily.       . fenofibrate 160 MG tablet Take 160 mg by mouth Daily.      . fish oil-omega-3 fatty acids 1000 MG capsule Take 4 g by mouth daily.       Marland Kitchen NITROSTAT 0.4 MG SL tablet TAKE AS DIRECTED AS NEEDED  25 tablet  3  . rosuvastatin (CRESTOR) 20 MG tablet Take 1 tablet (20 mg total) by mouth daily.  30 tablet  11     Past Medical History  Diagnosis Date  . Coronary artery disease     s/p PCI mild LAD (BMS)  . Hyperlipidemia   . Ischemic cardiomyopathy     A. 01/09/2011 - s/p St. Jude Fortify ST VR [place] AICD  . Congestive heart failure, NYHA class III   . VF (ventricular fibrillation)     arrest in setting of AMI 6/11    ROS:   All systems reviewed and negative except as noted in the HPI.   Past Surgical History  Procedure Date  . Back surgery   . Back surgery   . Knee arthroscopy     right  . Pacemaker insertion 2012    St Jude     Family History  Problem Relation Age of Onset  . Aneurysm Mother      History   Social History  . Marital Status: Single      Spouse Name: N/A    Number of Children: N/A  . Years of Education: N/A   Occupational History  . Not on file.   Social History Main Topics  . Smoking status: Current Some Day Smoker -- 1.0 packs/day for 34 years    Types: Cigarettes    Last Attempt to Quit: 06/13/2009  . Smokeless tobacco: Never Used   Comment: Socially  . Alcohol Use: 2.4 oz/week    4 Cans of beer per week  . Drug Use: No  . Sexually Active: Yes   Other Topics Concern  . Not on file   Social History Narrative  . No narrative on file     BP 122/76  Pulse 68  Ht 6\' 2"  (1.88 m)  Wt 224 lb 6.4 oz (101.787 kg)  BMI 28.81 kg/m2  Physical Exam:  Well appearing middle-aged man, NAD HEENT: Unremarkable Neck:  No JVD, no thyromegally Lungs:  Clear with no wheezes, rales, or rhonchi. HEART:  Regular rate rhythm, no murmurs, no rubs, no clicks Abd:  soft,  positive bowel sounds, no organomegally, no rebound, no guarding Ext:  2 plus pulses, no edema, no cyanosis, no clubbing Skin:  No rashes no nodules Neuro:  CN II through XII intact, motor grossly intact  EKG Normal sinus rhythm  DEVICE  Normal device function.  See PaceArt for details.   Assess/Plan:

## 2011-06-18 NOTE — Assessment & Plan Note (Signed)
He denies anginal symptoms. He is instructed to maintain a low-sodium diet and continue his physical activity. He currently works as a Curator.

## 2011-06-18 NOTE — Assessment & Plan Note (Signed)
The patient continues to have pain in his ICD insertion site though it appears to be improved somewhat. I discussed the possible treatment options with him. I would recommend a period of waiting. I expect his pain will improve with time. If it does not, surgical revision would be considered. For now he is instructed to take Tylenol or ibuprofen as needed.

## 2011-06-18 NOTE — Patient Instructions (Signed)
Your physician recommends that you schedule a follow-up appointment in: 3 months with Dr Taylor  

## 2011-08-13 ENCOUNTER — Telehealth: Payer: Self-pay | Admitting: Internal Medicine

## 2011-08-13 NOTE — Telephone Encounter (Signed)
Pt called in stating there is something wrong with his defib and wants to talk to [facility] I sent call to device but pt needs you to follow up per his request

## 2011-08-14 NOTE — Telephone Encounter (Signed)
Called and left patient a message to call me back to schedule an appointment with Dr Ladona Ridgel

## 2011-08-19 ENCOUNTER — Ambulatory Visit (INDEPENDENT_AMBULATORY_CARE_PROVIDER_SITE_OTHER): Payer: BC Managed Care – PPO | Admitting: Internal Medicine

## 2011-08-19 ENCOUNTER — Encounter: Payer: Self-pay | Admitting: Internal Medicine

## 2011-08-19 VITALS — BP 143/78 | HR 65 | Resp 18 | Ht 74.0 in | Wt 226.0 lb

## 2011-08-19 DIAGNOSIS — I2510 Atherosclerotic heart disease of native coronary artery without angina pectoris: Secondary | ICD-10-CM

## 2011-08-19 DIAGNOSIS — Z95810 Presence of automatic (implantable) cardiac defibrillator: Secondary | ICD-10-CM

## 2011-08-19 LAB — ICD DEVICE OBSERVATION
HV IMPEDENCE: 57 Ohm
PACEART VT: 0
RV LEAD AMPLITUDE: 11.8 mv
RV LEAD IMPEDENCE ICD: 400 Ohm
RV LEAD THRESHOLD: 1 V
TOT-0009: 0
TZON-0005SLOWVT: 6
TZON-0010SLOWVT: 40 ms

## 2011-08-19 MED ORDER — GABAPENTIN 300 MG PO CAPS: 300.0000 mg | ORAL_CAPSULE | Freq: Three times a day (TID) | ORAL | Status: DC

## 2011-08-19 NOTE — Patient Instructions (Addendum)
Your physician wants you to follow-up in: 3 months with Dr Court Joy will receive a reminder letter in the mail two months in advance. If you don't receive a letter, please call our office to schedule the follow-up appointment.   Your physician has recommended you make the following change in your medication:  1) Start Gabpentin 300mg  --Take one by mouth daily for one week then increase to one twice a day  for one week then increase to 1 three times a day

## 2011-08-19 NOTE — Progress Notes (Signed)
HPI Joe [name] returns today for followup. He is a pleasant middle aged man with a VF arrest, s/p ICD implant. He has a h/o CAD, s/p MI with severe LV dysfunction. He has had pain at his ICD insertion site. His symptoms may be a bit better but he still has pain, particularly when he is active. No Known Allergies   Current Outpatient Prescriptions  Medication Sig Dispense Refill  . aspirin 81 MG tablet Take 81 mg by mouth daily.        . carvedilol (COREG) 6.25 MG tablet Take 6.25 mg by mouth 2 (two) times daily with a meal.        . DIOVAN 80 MG tablet Take 80 mg by mouth daily.       . fenofibrate 160 MG tablet Take 160 mg by mouth Daily.      . fish oil-omega-3 fatty acids 1000 MG capsule Take 4 g by mouth daily.       Marland Kitchen NITROSTAT 0.4 MG SL tablet TAKE AS DIRECTED AS NEEDED  25 tablet  3  . rosuvastatin (CRESTOR) 20 MG tablet Take 1 tablet (20 mg total) by mouth daily.  30 tablet  11  . gabapentin (NEURONTIN) 300 MG capsule Take 1 capsule (300 mg total) by mouth 3 (three) times daily.  90 capsule  3     Past Medical History  Diagnosis Date  . Coronary artery disease     s/p PCI mild LAD (BMS)  . Hyperlipidemia   . Ischemic cardiomyopathy     A. 01/09/2011 - s/p St. Jude Fortify ST VR [place] AICD  . Congestive heart failure, NYHA class III   . VF (ventricular fibrillation)     arrest in setting of AMI 6/11    ROS:   All systems reviewed and negative except as noted in the HPI.   Past Surgical History  Procedure Date  . Back surgery   . Back surgery   . Knee arthroscopy     right  . Pacemaker insertion 2012    St Jude     Family History  Problem Relation Age of Onset  . Aneurysm Mother      History   Social History  . Marital Status: Single    Spouse Name: N/A    Number of Children: N/A  . Years of Education: N/A   Occupational History  . Not on file.   Social History Main Topics  . Smoking status: Current Some Day Smoker -- 1.0 packs/day for 34 years    Types: Cigarettes    Last Attempt to Quit: 06/13/2009  . Smokeless tobacco: Never Used   Comment: Socially  . Alcohol Use: 2.4 oz/week    4 Cans of beer per week  . Drug Use: No  . Sexually Active: Yes   Other Topics Concern  . Not on file   Social History Narrative  . No narrative on file     BP 143/78  Pulse 65  Resp 18  Ht 6\' 2"  (1.88 m)  Wt 226 lb (102.513 kg)  BMI 29.02 kg/m2  SpO2 98%  Physical Exam:  Well appearing NAD HEENT: Unremarkable Neck:  No JVD, no thyromegally Lungs:  Clear with no wheezes, rales, or rhonchi. Well healed ICD incision with no fluctuance, erythema and only minimal tenderness. HEART:  Regular rate rhythm, no murmurs, no rubs, no clicks Abd:  soft, positive bowel sounds, no organomegally, no rebound, no guarding Ext:  2 plus pulses, no edema, no  cyanosis, no clubbing Skin:  No rashes no nodules Neuro:  CN II through XII intact, motor grossly intact  DEVICE  Normal device function.  See PaceArt for details.   Assess/Plan:

## 2011-08-19 NOTE — Assessment & Plan Note (Signed)
His device is working normally. We discussed the treatment options. His exam is benign. While I have offered him the option of undergoing pocket revision with subpectoral implant, I think time is on our side. I have asked the patient to try Gabapentin in escalating doses. If ineffective, I would consider Nortriptyline or Lyrica.

## 2011-08-25 NOTE — Telephone Encounter (Signed)
Saw Dr Ladona Ridgel 08/19/11

## 2011-09-17 ENCOUNTER — Encounter: Payer: BC Managed Care – PPO | Admitting: Internal Medicine

## 2011-11-12 ENCOUNTER — Encounter: Payer: Self-pay | Admitting: *Deleted

## 2011-11-19 ENCOUNTER — Ambulatory Visit (INDEPENDENT_AMBULATORY_CARE_PROVIDER_SITE_OTHER): Payer: BC Managed Care – PPO | Admitting: Internal Medicine

## 2011-11-19 ENCOUNTER — Encounter: Payer: Self-pay | Admitting: Internal Medicine

## 2011-11-19 VITALS — BP 116/72 | HR 77 | Ht 74.0 in | Wt 226.0 lb

## 2011-11-19 DIAGNOSIS — I2510 Atherosclerotic heart disease of native coronary artery without angina pectoris: Secondary | ICD-10-CM

## 2011-11-19 DIAGNOSIS — I2589 Other forms of chronic ischemic heart disease: Secondary | ICD-10-CM

## 2011-11-19 DIAGNOSIS — Z95810 Presence of automatic (implantable) cardiac defibrillator: Secondary | ICD-10-CM

## 2011-11-19 LAB — ICD DEVICE OBSERVATION
BRDY-0002RV: 40 {beats}/min
DEVICE MODEL ICD: 1037513
FVT: 0
HV IMPEDENCE: 77 Ohm
PACEART VT: 0
TOT-0007: 1
TZON-0003SLOWVT: 350 ms
VENTRICULAR PACING ICD: 0 pct
VF: 0

## 2011-11-19 NOTE — Assessment & Plan Note (Signed)
He denies anginal symptoms. Will continue current medical therapy.

## 2011-11-19 NOTE — Progress Notes (Signed)
HPI [name] returns today for followup. He is a pleasant middle aged man with an ICM, chronic systolic heart failure, HTN, and s/p ICD implant. After his initial implant nearly a year ago, he had severe chest pressure at the ICD insertion site. He has had minimal improvement in his pain since then, though it has not worsened. No fever or chills. No Known Allergies   Current Outpatient Prescriptions  Medication Sig Dispense Refill  . aspirin 81 MG tablet Take 81 mg by mouth daily.        . carvedilol (COREG) 6.25 MG tablet Take 6.25 mg by mouth 2 (two) times daily with a meal.        . DIOVAN 80 MG tablet Take 80 mg by mouth daily.       . fenofibrate 160 MG tablet Take 160 mg by mouth Daily.      . fish oil-omega-3 fatty acids 1000 MG capsule Take 4 g by mouth daily.       Marland Kitchen gabapentin (NEURONTIN) 300 MG capsule Take 1 capsule (300 mg total) by mouth 3 (three) times daily.  90 capsule  3  . NITROSTAT 0.4 MG SL tablet TAKE AS DIRECTED AS NEEDED  25 tablet  3  . prasugrel (EFFIENT) 10 MG TABS Take 10 mg by mouth daily.      . rosuvastatin (CRESTOR) 20 MG tablet Take 1 tablet (20 mg total) by mouth daily.  30 tablet  11     Past Medical History  Diagnosis Date  . Coronary artery disease     s/p PCI mild LAD (BMS)  . Hyperlipidemia   . Ischemic cardiomyopathy     A. 01/09/2011 - s/p St. Jude Fortify ST VR [place] AICD  . Congestive heart failure, NYHA class III   . VF (ventricular fibrillation)     arrest in setting of AMI 6/11    ROS:   All systems reviewed and negative except as noted in the HPI.   Past Surgical History  Procedure Date  . Back surgery   . Back surgery   . Knee arthroscopy     right  . Pacemaker insertion 2012    St Jude     Family History  Problem Relation Age of Onset  . Aneurysm Mother      History   Social History  . Marital Status: Single    Spouse Name: N/A    Number of Children: N/A  . Years of Education: N/A   Occupational History    . Not on file.   Social History Main Topics  . Smoking status: Current Some Day Smoker -- 1.0 packs/day for 34 years    Types: Cigarettes    Last Attempt to Quit: 06/13/2009  . Smokeless tobacco: Never Used     Comment: Socially  . Alcohol Use: 2.4 oz/week    4 Cans of beer per week  . Drug Use: No  . Sexually Active: Yes   Other Topics Concern  . Not on file   Social History Narrative  . No narrative on file     BP 116/72  Pulse 77  Ht 6\' 2"  (1.88 m)  Wt 226 lb (102.513 kg)  BMI 29.02 kg/m2  SpO2 97%  Physical Exam:  Well appearing middle aged man, NAD HEENT: Unremarkable Neck:  No JVD, no thyromegally Lungs:  Clear with no wheezes, rales, or rhonchi HEART:  Regular rate rhythm, no murmurs, no rubs, no clicks Abd:  soft, positive bowel sounds,  no organomegally, no rebound, no guarding Ext:  2 plus pulses, no edema, no cyanosis, no clubbing Skin:  No rashes no nodules Neuro:  CN II through XII intact, motor grossly intact  DEVICE  Normal device function.  See PaceArt for details.   Assess/Plan:

## 2011-11-19 NOTE — Patient Instructions (Signed)
Your physician wants you to follow-up in: 7 months with Dr Court Joy will receive a reminder letter in the mail two months in advance. If you don't receive a letter, please call our office to schedule the follow-up appointment.

## 2011-11-19 NOTE — Assessment & Plan Note (Signed)
His St. Jude device is working normally. Will recheck in several months.  

## 2012-02-08 ENCOUNTER — Other Ambulatory Visit: Payer: Self-pay | Admitting: Internal Medicine

## 2012-02-23 ENCOUNTER — Encounter (INDEPENDENT_AMBULATORY_CARE_PROVIDER_SITE_OTHER): Payer: BC Managed Care – PPO

## 2012-02-23 ENCOUNTER — Ambulatory Visit (INDEPENDENT_AMBULATORY_CARE_PROVIDER_SITE_OTHER): Payer: BC Managed Care – PPO | Admitting: *Deleted

## 2012-02-23 DIAGNOSIS — I2589 Other forms of chronic ischemic heart disease: Secondary | ICD-10-CM

## 2012-02-23 DIAGNOSIS — R0989 Other specified symptoms and signs involving the circulatory and respiratory systems: Secondary | ICD-10-CM

## 2012-02-24 ENCOUNTER — Other Ambulatory Visit: Payer: Self-pay | Admitting: Internal Medicine

## 2012-02-24 ENCOUNTER — Encounter: Payer: Self-pay | Admitting: Internal Medicine

## 2012-02-24 LAB — ICD DEVICE OBSERVATION
DEV-0020ICD: NEGATIVE
DEVICE MODEL ICD: 1037513
HV IMPEDENCE: 73 Ohm
RV LEAD IMPEDENCE ICD: 410 Ohm
RV LEAD THRESHOLD: 0.75 V
TZON-0004SLOWVT: 16
TZON-0005SLOWVT: 6

## 2012-02-24 NOTE — Progress Notes (Signed)
ICD check for research with industry 

## 2012-03-02 ENCOUNTER — Other Ambulatory Visit: Payer: Self-pay | Admitting: Internal Medicine

## 2012-03-31 DIAGNOSIS — M961 Postlaminectomy syndrome, not elsewhere classified: Secondary | ICD-10-CM | POA: Diagnosis not present

## 2012-03-31 DIAGNOSIS — M5430 Sciatica, unspecified side: Secondary | ICD-10-CM | POA: Diagnosis not present

## 2012-04-13 DIAGNOSIS — M47817 Spondylosis without myelopathy or radiculopathy, lumbosacral region: Secondary | ICD-10-CM | POA: Diagnosis not present

## 2012-04-13 DIAGNOSIS — M4120 Other idiopathic scoliosis, site unspecified: Secondary | ICD-10-CM | POA: Diagnosis not present

## 2012-04-13 DIAGNOSIS — M51379 Other intervertebral disc degeneration, lumbosacral region without mention of lumbar back pain or lower extremity pain: Secondary | ICD-10-CM | POA: Diagnosis not present

## 2012-04-13 DIAGNOSIS — M545 Low back pain, unspecified: Secondary | ICD-10-CM | POA: Diagnosis not present

## 2012-04-13 DIAGNOSIS — M5430 Sciatica, unspecified side: Secondary | ICD-10-CM | POA: Diagnosis not present

## 2012-04-13 DIAGNOSIS — M5137 Other intervertebral disc degeneration, lumbosacral region: Secondary | ICD-10-CM | POA: Diagnosis not present

## 2012-04-23 DIAGNOSIS — M545 Low back pain, unspecified: Secondary | ICD-10-CM | POA: Diagnosis not present

## 2012-04-23 DIAGNOSIS — IMO0002 Reserved for concepts with insufficient information to code with codable children: Secondary | ICD-10-CM | POA: Diagnosis not present

## 2012-04-23 DIAGNOSIS — M5430 Sciatica, unspecified side: Secondary | ICD-10-CM | POA: Diagnosis not present

## 2012-04-23 DIAGNOSIS — M961 Postlaminectomy syndrome, not elsewhere classified: Secondary | ICD-10-CM | POA: Diagnosis not present

## 2012-05-26 ENCOUNTER — Ambulatory Visit (INDEPENDENT_AMBULATORY_CARE_PROVIDER_SITE_OTHER): Payer: Medicare Other | Admitting: *Deleted

## 2012-05-26 DIAGNOSIS — J322 Chronic ethmoidal sinusitis: Secondary | ICD-10-CM | POA: Diagnosis not present

## 2012-05-26 DIAGNOSIS — H6980 Other specified disorders of Eustachian tube, unspecified ear: Secondary | ICD-10-CM | POA: Diagnosis not present

## 2012-05-26 DIAGNOSIS — J342 Deviated nasal septum: Secondary | ICD-10-CM | POA: Diagnosis not present

## 2012-05-26 DIAGNOSIS — G4733 Obstructive sleep apnea (adult) (pediatric): Secondary | ICD-10-CM | POA: Diagnosis not present

## 2012-05-26 DIAGNOSIS — I2589 Other forms of chronic ischemic heart disease: Secondary | ICD-10-CM | POA: Diagnosis not present

## 2012-05-26 LAB — ICD DEVICE OBSERVATION
DEV-0020ICD: NEGATIVE
DEVICE MODEL ICD: 1037513
FVT: 0
RV LEAD AMPLITUDE: 11.8 mv
RV LEAD IMPEDENCE ICD: 437.5 Ohm
RV LEAD THRESHOLD: 0.75 V
TOT-0007: 1
TOT-0008: 0
TOT-0010: 5
TZON-0003SLOWVT: 350 ms
TZON-0004SLOWVT: 16
TZON-0005SLOWVT: 6
TZON-0010SLOWVT: 40 ms

## 2012-05-26 NOTE — Progress Notes (Signed)
ICD check in clinic 

## 2012-06-22 DIAGNOSIS — M48061 Spinal stenosis, lumbar region without neurogenic claudication: Secondary | ICD-10-CM | POA: Diagnosis not present

## 2012-06-22 DIAGNOSIS — M5137 Other intervertebral disc degeneration, lumbosacral region: Secondary | ICD-10-CM | POA: Diagnosis not present

## 2012-06-29 ENCOUNTER — Encounter: Payer: Self-pay | Admitting: Internal Medicine

## 2012-07-06 DIAGNOSIS — J329 Chronic sinusitis, unspecified: Secondary | ICD-10-CM | POA: Diagnosis not present

## 2012-07-07 ENCOUNTER — Telehealth: Payer: Self-pay | Admitting: Internal Medicine

## 2012-07-07 NOTE — Telephone Encounter (Signed)
New Problem:    Patient called in because he has a back surgery this Saturday and would like to know how he needed to prepare because of his defibrillator.  Please call back.

## 2012-07-07 NOTE — Telephone Encounter (Signed)
Spoke with patient and he is having his surgery on Sat.  Joe [name] has taken care of this from a device stand point.  St Jude aware and on call person will be notified if needed  Will not turn off device

## 2012-07-10 DIAGNOSIS — M5137 Other intervertebral disc degeneration, lumbosacral region: Secondary | ICD-10-CM | POA: Diagnosis present

## 2012-07-10 DIAGNOSIS — I509 Heart failure, unspecified: Secondary | ICD-10-CM | POA: Diagnosis present

## 2012-07-10 DIAGNOSIS — I2510 Atherosclerotic heart disease of native coronary artery without angina pectoris: Secondary | ICD-10-CM | POA: Diagnosis present

## 2012-07-10 DIAGNOSIS — M545 Low back pain, unspecified: Secondary | ICD-10-CM | POA: Diagnosis not present

## 2012-07-10 DIAGNOSIS — Z9861 Coronary angioplasty status: Secondary | ICD-10-CM | POA: Diagnosis not present

## 2012-07-10 DIAGNOSIS — Z95810 Presence of automatic (implantable) cardiac defibrillator: Secondary | ICD-10-CM | POA: Diagnosis not present

## 2012-07-10 DIAGNOSIS — Z87891 Personal history of nicotine dependence: Secondary | ICD-10-CM | POA: Diagnosis not present

## 2012-07-10 DIAGNOSIS — G4733 Obstructive sleep apnea (adult) (pediatric): Secondary | ICD-10-CM | POA: Diagnosis present

## 2012-07-10 DIAGNOSIS — M549 Dorsalgia, unspecified: Secondary | ICD-10-CM | POA: Diagnosis not present

## 2012-07-10 DIAGNOSIS — M48061 Spinal stenosis, lumbar region without neurogenic claudication: Secondary | ICD-10-CM | POA: Diagnosis not present

## 2012-07-10 DIAGNOSIS — Z7982 Long term (current) use of aspirin: Secondary | ICD-10-CM | POA: Diagnosis not present

## 2012-08-02 DIAGNOSIS — I2510 Atherosclerotic heart disease of native coronary artery without angina pectoris: Secondary | ICD-10-CM | POA: Diagnosis not present

## 2012-08-02 DIAGNOSIS — Z79899 Other long term (current) drug therapy: Secondary | ICD-10-CM | POA: Diagnosis not present

## 2012-08-17 DIAGNOSIS — G4733 Obstructive sleep apnea (adult) (pediatric): Secondary | ICD-10-CM | POA: Diagnosis not present

## 2012-08-30 ENCOUNTER — Ambulatory Visit (INDEPENDENT_AMBULATORY_CARE_PROVIDER_SITE_OTHER): Payer: Medicare Other | Admitting: *Deleted

## 2012-08-30 ENCOUNTER — Encounter: Payer: Self-pay | Admitting: Internal Medicine

## 2012-08-30 DIAGNOSIS — I2589 Other forms of chronic ischemic heart disease: Secondary | ICD-10-CM

## 2012-08-30 NOTE — Progress Notes (Signed)
icd check in clinic by Research. Normal device function. No episodes. Merlin 12-06-12.

## 2012-09-03 LAB — ICD DEVICE OBSERVATION
BRDY-0002RV: 40 {beats}/min
FVT: 0
HV IMPEDENCE: 72 Ohm
PACEART VT: 0
RV LEAD IMPEDENCE ICD: 537.5 Ohm
RV LEAD THRESHOLD: 1 V
TOT-0009: 0
TZON-0005SLOWVT: 6
VENTRICULAR PACING ICD: 0 pct
VF: 0

## 2012-09-09 DIAGNOSIS — R0989 Other specified symptoms and signs involving the circulatory and respiratory systems: Secondary | ICD-10-CM | POA: Diagnosis not present

## 2012-09-09 DIAGNOSIS — R609 Edema, unspecified: Secondary | ICD-10-CM | POA: Diagnosis not present

## 2012-09-09 DIAGNOSIS — R0609 Other forms of dyspnea: Secondary | ICD-10-CM | POA: Diagnosis not present

## 2012-09-27 DIAGNOSIS — G4733 Obstructive sleep apnea (adult) (pediatric): Secondary | ICD-10-CM | POA: Diagnosis not present

## 2012-10-05 DIAGNOSIS — Z23 Encounter for immunization: Secondary | ICD-10-CM | POA: Diagnosis not present

## 2012-10-09 ENCOUNTER — Other Ambulatory Visit: Payer: Self-pay | Admitting: Cardiology

## 2012-10-09 DIAGNOSIS — E781 Pure hyperglyceridemia: Secondary | ICD-10-CM

## 2012-10-09 DIAGNOSIS — Z79899 Other long term (current) drug therapy: Secondary | ICD-10-CM

## 2012-10-12 ENCOUNTER — Encounter: Payer: Self-pay | Admitting: Internal Medicine

## 2012-10-25 ENCOUNTER — Other Ambulatory Visit: Payer: BC Managed Care – PPO

## 2012-10-26 ENCOUNTER — Other Ambulatory Visit: Payer: BC Managed Care – PPO

## 2012-10-26 ENCOUNTER — Encounter: Payer: Self-pay | Admitting: Cardiology

## 2012-10-26 DIAGNOSIS — E781 Pure hyperglyceridemia: Secondary | ICD-10-CM | POA: Diagnosis not present

## 2012-10-26 DIAGNOSIS — Z79899 Other long term (current) drug therapy: Secondary | ICD-10-CM | POA: Diagnosis not present

## 2012-11-02 ENCOUNTER — Telehealth: Payer: Self-pay | Admitting: Pharmacist

## 2012-11-02 DIAGNOSIS — E782 Mixed hyperlipidemia: Secondary | ICD-10-CM

## 2012-11-02 DIAGNOSIS — Z79899 Other long term (current) drug therapy: Secondary | ICD-10-CM

## 2012-11-02 MED ORDER — CHOLINE FENOFIBRATE 135 MG PO CPDR: 135.0000 mg | DELAYED_RELEASE_CAPSULE | Freq: Every day | ORAL | Status: DC

## 2012-11-02 NOTE — Telephone Encounter (Signed)
Agree with recommendations.  

## 2012-11-02 NOTE — Telephone Encounter (Signed)
Labs from [facility] to be scanned into chart.  Risk Factors:  CAD, age, low HDL - LDL goal < 70, non-HDL goal < 100, LDL-P goal < 1000 Meds:  Crestor 40 mg qd, Zetia 10 mg qd, fish oil 4,000 mg daily. Intolerant:  Lipitor (myalgias) Patient stopped Trilipix 135 mg qd in 07/2012, and changed to Zetia instead, as TG were well controlled and LDL-P was 1350.  Since that time, his LDL-P has dropped to 680 nmol/L (great), but his TG have trended up to 903 mg/dL (bad).  Patient does admit to drinking 3-4 mixed drinks with [place] a few days prior to his lab work at he was at a race.  This is not typical for him.  He has alcohol only occasionally.  Plan: Patient will restart trlipix as he has plenty at home still, and will continue Zetia 10 mg qd as well given it dropped his LDL-P 50% after adding this.  Will reduce his Crestor to 20 mg qd given other agents being used, and in hopes of keeping cost down (1/2 of 40 mg pill). Will recheck NMR LipoProfile and hepatic panel in 3 months.  Patient notified, labs set up.   To Dr. Mayford Knife as FYI only.

## 2012-11-22 ENCOUNTER — Other Ambulatory Visit: Payer: Self-pay | Admitting: Cardiology

## 2012-11-23 ENCOUNTER — Other Ambulatory Visit: Payer: Self-pay

## 2012-11-23 MED ORDER — CHOLINE FENOFIBRATE 135 MG PO CPDR: 135.0000 mg | DELAYED_RELEASE_CAPSULE | Freq: Every day | ORAL | Status: DC

## 2012-11-24 ENCOUNTER — Encounter: Payer: Self-pay | Admitting: Cardiology

## 2012-11-24 ENCOUNTER — Ambulatory Visit (INDEPENDENT_AMBULATORY_CARE_PROVIDER_SITE_OTHER): Payer: Medicare Other | Admitting: Cardiology

## 2012-11-24 VITALS — BP 140/90 | HR 80 | Ht 74.0 in | Wt 250.0 lb

## 2012-11-24 DIAGNOSIS — I5022 Chronic systolic (congestive) heart failure: Secondary | ICD-10-CM

## 2012-11-24 DIAGNOSIS — I2589 Other forms of chronic ischemic heart disease: Secondary | ICD-10-CM | POA: Diagnosis not present

## 2012-11-24 DIAGNOSIS — G4733 Obstructive sleep apnea (adult) (pediatric): Secondary | ICD-10-CM | POA: Diagnosis not present

## 2012-11-24 DIAGNOSIS — I2510 Atherosclerotic heart disease of native coronary artery without angina pectoris: Secondary | ICD-10-CM

## 2012-11-24 DIAGNOSIS — E785 Hyperlipidemia, unspecified: Secondary | ICD-10-CM

## 2012-11-24 DIAGNOSIS — I509 Heart failure, unspecified: Secondary | ICD-10-CM

## 2012-11-24 DIAGNOSIS — I255 Ischemic cardiomyopathy: Secondary | ICD-10-CM

## 2012-11-24 NOTE — Patient Instructions (Addendum)
Your physician wants you to follow-up in: 6 months with Dr. Turner. You will receive a reminder letter in the mail two months in advance. If you don't receive a letter, please call our office to schedule the follow-up appointment.  

## 2012-11-25 ENCOUNTER — Encounter: Payer: Self-pay | Admitting: Cardiology

## 2012-11-25 DIAGNOSIS — I255 Ischemic cardiomyopathy: Secondary | ICD-10-CM | POA: Insufficient documentation

## 2012-11-25 DIAGNOSIS — G4733 Obstructive sleep apnea (adult) (pediatric): Secondary | ICD-10-CM | POA: Insufficient documentation

## 2012-11-25 DIAGNOSIS — I5022 Chronic systolic (congestive) heart failure: Secondary | ICD-10-CM | POA: Insufficient documentation

## 2012-11-25 DIAGNOSIS — I2510 Atherosclerotic heart disease of native coronary artery without angina pectoris: Secondary | ICD-10-CM | POA: Insufficient documentation

## 2012-11-25 NOTE — Progress Notes (Signed)
[address] [place], [place]  [address] Phone: [phone] Fax:  [phone]  Date:  11/24/2012   ID:  Joe [name], DOB 1955/01/26, MRN [id number]  PCP:  No primary provider on file.  Cardiologist:  Armanda Magic, MD     History of Present Illness: Joe [name] is a 57 y.o. male with a history of ASCAD, ischemic DCM with AICD, chronic systolic CHF class III, dyslipidemia, obesity and OSA on CPAP who presents today for followup.  He is doing well.  He denies any chest pain, SOB, DOE, LE edema, dizziness, palpitations or syncope.  He tolerates his CPAP well.  He tolerates the mask and feels the pressure is adequate.  He has no daytime sleepiness and feels refreshed when he gets up. He is complaining of muscle aches throughout his body and is concerned it may be his statin.   Wt Readings from Last 3 Encounters:  11/24/12 250 lb (113.399 kg)  11/19/11 226 lb (102.513 kg)  08/19/11 226 lb (102.513 kg)     Past Medical History  Diagnosis Date  . Ischemic cardiomyopathy     A. 01/09/2011 - s/p St. Jude Fortify ST VR [place] AICD  . VF (ventricular fibrillation)     arrest in setting of AMI 6/11  . Hyperlipidemia   . Coronary artery disease     s/p PCI mild LAD (BMS) in setting of AWMI complicated by Vfib arrest with 40-50% residual disesae in left circ  . Congestive heart failure, NYHA class III   . OSA (obstructive sleep apnea)     severe with AHI 26/hr now on CPAP at [facility]    Current Outpatient Prescriptions  Medication Sig Dispense Refill  . aspirin 81 MG tablet Take 81 mg by mouth daily.        . carvedilol (COREG) 6.25 MG tablet Take 6.25 mg by mouth 2 (two) times daily with a meal.        . Choline Fenofibrate 135 MG capsule Take 1 capsule (135 mg total) by mouth daily.  30 capsule  5  . CRESTOR 20 MG tablet TAKE 1 TABLET BY MOUTH DAILY  30 tablet  3  . DIOVAN 80 MG tablet TAKE 1 TABLET BY MOUTH EVERY DAY  30 tablet  5  . ezetimibe (ZETIA) 10 MG  tablet Take 10 mg by mouth daily.      . fish oil-omega-3 fatty acids 1000 MG capsule Take 4 g by mouth daily.       Marland Kitchen NITROSTAT 0.4 MG SL tablet TAKE AS DIRECTED AS NEEDED  25 tablet  3  . prasugrel (EFFIENT) 10 MG TABS Take 10 mg by mouth daily.       No current facility-administered medications for this visit.    Allergies:    Allergies  Allergen Reactions  . Imdur [Isosorbide Dinitrate]     headache    Social History:  The patient  reports that he quit smoking about 3 years ago. His smoking use included Cigarettes. He has a 34 pack-year smoking history. He has never used smokeless tobacco. He reports that he drinks alcohol. He reports that he does not use illicit drugs.   Family History:  The patient's family history includes Aneurysm in his mother.   ROS:  Please see the history of present illness.       All other systems reviewed and negative.   PHYSICAL EXAM: VS:  BP 140/90  Pulse 80  Ht 6\' 2"  (1.88  m)  Wt 250 lb (113.399 kg)  BMI 32.08 kg/m2 Well nourished, well developed, in no acute distress HEENT: normal Neck: no JVD Cardiac:  normal S1, S2; RRR; no murmur Lungs:  clear to auscultation bilaterally, no wheezing, rhonchi or rales Abd: soft, nontender, no hepatomegaly Ext: no edema Skin: warm and dry Neuro:  CNs 2-12 intact, no focal abnormalities noted      ASSESSMENT AND PLAN:  1. ASCAD with no angina  - continue ASA/Effient 2. Dyslipidemia  - continue fish oil/Zetia/crestor/Trilipix  - he will hold Crestor for 2 weeks to see if muscle aches resolve and he will call and let me know 3. OSA on CPAP  - continue CPAP   - get download from DME 4. Ischemic DCM  - continue ACE I/beta blocker 5. Chronic systolic CHF well compensated  Followup with me in 6 months  Signed, Armanda Magic, MD 11/25/2012 12:20 PM

## 2012-12-06 ENCOUNTER — Ambulatory Visit (INDEPENDENT_AMBULATORY_CARE_PROVIDER_SITE_OTHER): Payer: Medicare Other | Admitting: *Deleted

## 2012-12-06 DIAGNOSIS — I428 Other cardiomyopathies: Secondary | ICD-10-CM | POA: Diagnosis not present

## 2012-12-12 ENCOUNTER — Other Ambulatory Visit: Payer: Self-pay | Admitting: Internal Medicine

## 2012-12-12 ENCOUNTER — Encounter: Payer: Self-pay | Admitting: Internal Medicine

## 2012-12-12 LAB — MDC_IDC_ENUM_SESS_TYPE_REMOTE
Battery Remaining Longevity: 91 mo
Implantable Pulse Generator Serial Number: 1037513
Lead Channel Impedance Value: 450 Ohm
Lead Channel Setting Pacing Amplitude: 2.5 V
Lead Channel Setting Pacing Pulse Width: 0.5 ms
Lead Channel Setting Sensing Sensitivity: 0.5 mV
Zone Setting Detection Interval: 310 ms

## 2012-12-16 ENCOUNTER — Telehealth: Payer: Self-pay

## 2012-12-16 NOTE — Telephone Encounter (Signed)
Patient called for samples of zetia placed samples up front with patient assistance application

## 2012-12-17 ENCOUNTER — Encounter: Payer: Self-pay | Admitting: *Deleted

## 2012-12-23 ENCOUNTER — Telehealth: Payer: Self-pay | Admitting: *Deleted

## 2012-12-23 NOTE — Telephone Encounter (Signed)
Patient assistance program from [employer] for [employer] completed and mailed as directed

## 2013-01-19 ENCOUNTER — Encounter: Payer: Self-pay | Admitting: General Surgery

## 2013-01-19 ENCOUNTER — Telehealth: Payer: Self-pay | Admitting: Cardiology

## 2013-01-19 ENCOUNTER — Telehealth: Payer: Self-pay

## 2013-01-19 MED ORDER — ROSUVASTATIN CALCIUM 5 MG PO TABS: 5.0000 mg | ORAL_TABLET | Freq: Every day | ORAL | Status: DC

## 2013-01-19 NOTE — Telephone Encounter (Signed)
Patient was on Crestor 20 mg pills.  Also on Zetia 10 mg qd.  Last NMR looked great in 10/2012 so would like patient to restart Crestor, but at a much lower dosage. Plan: 1.  Restart Crestor but at just 1/4 tablet of the 20 mg pill daily (thus 5 mg daily). 2.  Continue Zetia. 3.  Recheck NMR LipoProfile and hepatic panel in 3 months (okay to cancel any earlier cholesterol/liver checks) Please notify patient, update meds, and set up labs. Thanks.

## 2013-01-19 NOTE — Telephone Encounter (Signed)
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

## 2013-01-19 NOTE — Telephone Encounter (Signed)
Patient wanted samples of zetia place up front

## 2013-01-19 NOTE — Telephone Encounter (Signed)
Follow up       Pt wants danielle to call him about disability form

## 2013-01-19 NOTE — Telephone Encounter (Signed)
Pt also wanted to make Dr Radford Pax aware that he is doing well with holding his Crestor. His aching has gone away. Pt wants to know if it is ok to stay off Crestor.

## 2013-01-19 NOTE — Telephone Encounter (Signed)
Please forward to [facility] for further recs on lipid management

## 2013-01-19 NOTE — Telephone Encounter (Signed)
Pt is ok with taking Crestor. Gave pt samples of Crestor.

## 2013-01-19 NOTE — Telephone Encounter (Signed)
To Ysidro Evert to advise

## 2013-01-19 NOTE — Telephone Encounter (Signed)
New Message  Pt called about a disability claim form that he will need to have the Dr. Luane School. Please cal back to discuss.

## 2013-01-25 ENCOUNTER — Telehealth: Payer: Self-pay | Admitting: Cardiology

## 2013-01-25 NOTE — Telephone Encounter (Signed)
Called pt and explained we are waiting on a call back from Disability determination services about who signed off on disability paper work. If we did not sign off on them we technically can not sign off on the paper work. Pt is aware. I told him if we did not sign off at least we might be able to find out who did and that they could then sign off for him.

## 2013-01-25 NOTE — Telephone Encounter (Signed)
I spoke with disability Determination services and they stated no Dr. Had to sign off on disability because they are not the ones who decide who gets disability. She stated pt should not need a doctors signature and that he should call the Disability Services and that they should be able to send a letter for the pt that should cover whatever the county tax assessor should need. We can not sign the form because we did not make the decision of disability for the pt.

## 2013-01-25 NOTE — Telephone Encounter (Signed)
New Prob   Pt is following up on a social security form he dropped of a few days ago. Pleas call.

## 2013-01-26 NOTE — Telephone Encounter (Signed)
Pt is aware. His paper work is up front for him to pick up in the bag that his samples of zetia are in.

## 2013-01-27 NOTE — Telephone Encounter (Signed)
Spoke with pt and he stated it was an unofficial signature just stating that we know the pt is on disability. I explained that Dr Radford Pax would not be in town the thru some of next week and I suggested he ask his primary care provider to sign the form.

## 2013-01-27 NOTE — Telephone Encounter (Signed)
Patient needs you to call him back regarding tax forms?

## 2013-02-09 ENCOUNTER — Other Ambulatory Visit: Payer: BC Managed Care – PPO

## 2013-02-15 ENCOUNTER — Other Ambulatory Visit: Payer: Self-pay | Admitting: Cardiology

## 2013-02-16 ENCOUNTER — Encounter: Payer: BC Managed Care – PPO | Admitting: Internal Medicine

## 2013-03-01 ENCOUNTER — Other Ambulatory Visit: Payer: BC Managed Care – PPO

## 2013-03-01 ENCOUNTER — Encounter: Payer: BC Managed Care – PPO | Admitting: Internal Medicine

## 2013-03-09 ENCOUNTER — Other Ambulatory Visit: Payer: Self-pay | Admitting: Cardiology

## 2013-03-10 ENCOUNTER — Encounter: Payer: BC Managed Care – PPO | Admitting: Internal Medicine

## 2013-03-15 ENCOUNTER — Encounter: Payer: Self-pay | Admitting: Internal Medicine

## 2013-03-15 ENCOUNTER — Ambulatory Visit (INDEPENDENT_AMBULATORY_CARE_PROVIDER_SITE_OTHER): Payer: Medicare Other | Admitting: Internal Medicine

## 2013-03-15 ENCOUNTER — Other Ambulatory Visit (INDEPENDENT_AMBULATORY_CARE_PROVIDER_SITE_OTHER): Payer: Medicare Other

## 2013-03-15 VITALS — BP 128/84 | HR 69 | Ht 74.0 in | Wt 239.0 lb

## 2013-03-15 DIAGNOSIS — I5022 Chronic systolic (congestive) heart failure: Secondary | ICD-10-CM | POA: Diagnosis not present

## 2013-03-15 DIAGNOSIS — Z95810 Presence of automatic (implantable) cardiac defibrillator: Secondary | ICD-10-CM

## 2013-03-15 DIAGNOSIS — I509 Heart failure, unspecified: Secondary | ICD-10-CM | POA: Diagnosis not present

## 2013-03-15 DIAGNOSIS — Z79899 Other long term (current) drug therapy: Secondary | ICD-10-CM

## 2013-03-15 DIAGNOSIS — I428 Other cardiomyopathies: Secondary | ICD-10-CM | POA: Diagnosis not present

## 2013-03-15 DIAGNOSIS — I2589 Other forms of chronic ischemic heart disease: Secondary | ICD-10-CM

## 2013-03-15 DIAGNOSIS — E782 Mixed hyperlipidemia: Secondary | ICD-10-CM | POA: Diagnosis not present

## 2013-03-15 DIAGNOSIS — I255 Ischemic cardiomyopathy: Secondary | ICD-10-CM

## 2013-03-15 LAB — HEPATIC FUNCTION PANEL
ALK PHOS: 47 U/L (ref 39–117)
ALT: 29 U/L (ref 0–53)
AST: 24 U/L (ref 0–37)
Albumin: 4.3 g/dL (ref 3.5–5.2)
BILIRUBIN DIRECT: 0 mg/dL (ref 0.0–0.3)
BILIRUBIN TOTAL: 0.6 mg/dL (ref 0.3–1.2)
Total Protein: 7.1 g/dL (ref 6.0–8.3)

## 2013-03-15 LAB — MDC_IDC_ENUM_SESS_TYPE_INCLINIC
Battery Remaining Longevity: 88.8 mo
Brady Statistic RV Percent Paced: 0.03 %
HighPow Impedance: 72 Ohm
Lead Channel Pacing Threshold Amplitude: 0.75 V
Lead Channel Pacing Threshold Pulse Width: 0.5 ms
Lead Channel Sensing Intrinsic Amplitude: 11.8 mV
Lead Channel Setting Pacing Amplitude: 2.5 V
MDC IDC MSMT LEADCHNL RV IMPEDANCE VALUE: 450 Ohm
MDC IDC MSMT LEADCHNL RV PACING THRESHOLD AMPLITUDE: 0.75 V
MDC IDC MSMT LEADCHNL RV PACING THRESHOLD PULSEWIDTH: 0.5 ms
MDC IDC PG SERIAL: 1037513
MDC IDC SESS DTM: 20150303094831
MDC IDC SET LEADCHNL RV PACING PULSEWIDTH: 0.5 ms
MDC IDC SET LEADCHNL RV SENSING SENSITIVITY: 0.5 mV
MDC IDC SET ZONE DETECTION INTERVAL: 310 ms
Zone Setting Detection Interval: 350 ms

## 2013-03-15 NOTE — Patient Instructions (Signed)
Your physician wants you to follow-up in: 6 months in device clinic with research and 12 months with Dr Knox Saliva will receive a reminder letter in the mail two months in advance. If you don't receive a letter, please call our office to schedule the follow-up appointment.   Remote monitoring is used to monitor your Pacemaker or ICD from home. This monitoring reduces the number of office visits required to check your device to one time per year. It allows [place] to keep an eye on the functioning of your device to ensure it is working properly. You are scheduled for a device check from home on 06/16/13. You may send your transmission at any time that day. If you have a wireless device, the transmission will be sent automatically. After your physician reviews your transmission, you will receive a postcard with your next transmission date.

## 2013-03-16 ENCOUNTER — Encounter: Payer: Self-pay | Admitting: Internal Medicine

## 2013-03-16 LAB — NMR LIPOPROFILE WITH LIPIDS
Cholesterol, Total: 243 mg/dL — ABNORMAL HIGH (ref <200)
HDL Particle Number: 43.3 umol/L (ref >=30.5)
HDL SIZE: 8.6 nm — AB (ref >=9.2)
HDL-C: 53 mg/dL (ref >=40)
LDL (calc): 141 mg/dL — ABNORMAL HIGH (ref <100)
LDL Particle Number: 2124 nmol/L — ABNORMAL HIGH (ref <1000)
LDL Size: 20 nm — ABNORMAL LOW (ref >20.5)
LP-IR Score: 73 — ABNORMAL HIGH (ref <=45)
Large HDL-P: 3 umol/L — ABNORMAL LOW (ref >=4.8)
Large VLDL-P: 4.9 nmol/L — ABNORMAL HIGH (ref <=2.7)
Small LDL Particle Number: 1516 nmol/L — ABNORMAL HIGH (ref <=527)
TRIGLYCERIDES: 245 mg/dL — AB (ref <150)
VLDL Size: 45.6 nm (ref <=46.6)

## 2013-03-16 NOTE — Assessment & Plan Note (Signed)
His device is working normally. Will plan to recheck in several months.

## 2013-03-16 NOTE — Assessment & Plan Note (Signed)
He denies anginal symptoms. He will continue his current meds. I have encourage him to increase his physical activity.

## 2013-03-16 NOTE — Progress Notes (Signed)
HPI Joe [name] returns today for followup. He is a pleasant middle aged man with an ICM, chronic systolic heart failure, HTN, and s/p ICD implant. After his initial implant nearly a year ago, he had severe chest pressure at the ICD insertion site. He has had minimal improvement in his pain since then, though it has not worsened. No fever or chills.  Allergies  Allergen Reactions  . Imdur [Isosorbide Dinitrate]     headache     Current Outpatient Prescriptions  Medication Sig Dispense Refill  . aspirin 81 MG tablet Take 81 mg by mouth daily.        . carvedilol (COREG) 6.25 MG tablet TAKE 1 TABLET BY MOUTH TWICE DAILY WITH FOOD  60 tablet  2  . Choline Fenofibrate 135 MG capsule Take 1 capsule (135 mg total) by mouth daily.  30 capsule  5  . ezetimibe (ZETIA) 10 MG tablet Take 10 mg by mouth daily.      . fish oil-omega-3 fatty acids 1000 MG capsule Take 4 g by mouth daily.       Marland Kitchen NITROSTAT 0.4 MG SL tablet TAKE AS DIRECTED AS NEEDED  25 tablet  3  . prasugrel (EFFIENT) 10 MG TABS Take 10 mg by mouth daily.      . rosuvastatin (CRESTOR) 5 MG tablet Take 1 tablet (5 mg total) by mouth daily.  30 tablet  11  . valsartan (DIOVAN) 80 MG tablet TAKE 1 TABLET BY MOUTH EVERY DAY  30 tablet  5   No current facility-administered medications for this visit.     Past Medical History  Diagnosis Date  . Ischemic cardiomyopathy     A. 01/09/2011 - s/p St. Jude Fortify ST VR [place] AICD  . VF (ventricular fibrillation)     arrest in setting of AMI 6/11  . Hyperlipidemia   . Congestive heart failure, NYHA class III   . OSA (obstructive sleep apnea)     severe with AHI 26/hr now on CPAP at [facility]  . Coronary artery disease     s/p PCI mild LAD (BMS) in setting of AWMI complicated by Vfib arrest with 40-50% residual disesae in left circ    ROS:   All systems reviewed and negative except as noted in the HPI.   Past Surgical History  Procedure Laterality Date  . Back surgery    . Back  surgery    . Knee arthroscopy      right  . Pacemaker insertion  2012    St Jude     Family History  Problem Relation Age of Onset  . Aneurysm Mother      History   Social History  . Marital Status: Single    Spouse Name: N/A    Number of Children: N/A  . Years of Education: N/A   Occupational History  . Not on file.   Social History Main Topics  . Smoking status: Former Smoker -- 1.00 packs/day for 34 years    Types: Cigarettes    Quit date: 06/13/2009  . Smokeless tobacco: Never Used     Comment: Socially  . Alcohol Use: 0.0 oz/week     Comment: occasional  . Drug Use: No  . Sexual Activity: Yes   Other Topics Concern  . Not on file   Social History Narrative  . No narrative on file     BP 128/84  Pulse 69  Ht 6\' 2"  (1.88 m)  Wt 239 lb (108.41 kg)  BMI 30.67 kg/m2  Physical Exam:  Well appearing middle aged man, NAD HEENT: Unremarkable Neck:  No JVD, no thyromegally Lungs:  Clear with no wheezes, rales, or rhonchi HEART:  Regular rate rhythm, no murmurs, no rubs, no clicks Abd:  soft, positive bowel sounds, no organomegally, no rebound, no guarding Ext:  2 plus pulses, no edema, no cyanosis, no clubbing Skin:  No rashes no nodules Neuro:  CN II through XII intact, motor grossly intact  DEVICE  Normal device function.  See PaceArt for details.   Assess/Plan:

## 2013-03-16 NOTE — Assessment & Plan Note (Signed)
His symptoms are class 2. He will continue his current meds and I have asked him to reduce his sodium intake and increase his physical activity and lose weight.

## 2013-03-18 ENCOUNTER — Telehealth: Payer: Self-pay | Admitting: Cardiology

## 2013-03-18 ENCOUNTER — Encounter: Payer: Self-pay | Admitting: General Surgery

## 2013-03-18 NOTE — Telephone Encounter (Signed)
New message ° ° ° ° °Pt is returning Danielle's call °

## 2013-03-21 ENCOUNTER — Encounter: Payer: Self-pay | Admitting: General Surgery

## 2013-03-21 ENCOUNTER — Other Ambulatory Visit: Payer: Self-pay | Admitting: General Surgery

## 2013-03-21 DIAGNOSIS — I2510 Atherosclerotic heart disease of native coronary artery without angina pectoris: Secondary | ICD-10-CM

## 2013-03-21 MED ORDER — ROSUVASTATIN CALCIUM 10 MG PO TABS: 10.0000 mg | ORAL_TABLET | Freq: Every day | ORAL | Status: DC

## 2013-03-21 NOTE — Telephone Encounter (Signed)
Spoke to pt

## 2013-04-19 ENCOUNTER — Other Ambulatory Visit: Payer: BC Managed Care – PPO

## 2013-05-17 ENCOUNTER — Other Ambulatory Visit: Payer: Self-pay | Admitting: Cardiology

## 2013-05-23 ENCOUNTER — Telehealth: Payer: Self-pay

## 2013-05-23 ENCOUNTER — Encounter: Payer: Self-pay | Admitting: Cardiology

## 2013-05-23 ENCOUNTER — Ambulatory Visit (INDEPENDENT_AMBULATORY_CARE_PROVIDER_SITE_OTHER): Payer: Medicare Other | Admitting: Cardiology

## 2013-05-23 VITALS — BP 118/82 | HR 74 | Ht 73.0 in | Wt 237.0 lb

## 2013-05-23 DIAGNOSIS — I255 Ischemic cardiomyopathy: Secondary | ICD-10-CM

## 2013-05-23 DIAGNOSIS — I509 Heart failure, unspecified: Secondary | ICD-10-CM | POA: Diagnosis not present

## 2013-05-23 DIAGNOSIS — I5022 Chronic systolic (congestive) heart failure: Secondary | ICD-10-CM | POA: Diagnosis not present

## 2013-05-23 DIAGNOSIS — G4733 Obstructive sleep apnea (adult) (pediatric): Secondary | ICD-10-CM

## 2013-05-23 DIAGNOSIS — E785 Hyperlipidemia, unspecified: Secondary | ICD-10-CM | POA: Diagnosis not present

## 2013-05-23 DIAGNOSIS — Z95810 Presence of automatic (implantable) cardiac defibrillator: Secondary | ICD-10-CM

## 2013-05-23 DIAGNOSIS — I2510 Atherosclerotic heart disease of native coronary artery without angina pectoris: Secondary | ICD-10-CM | POA: Diagnosis not present

## 2013-05-23 DIAGNOSIS — I2589 Other forms of chronic ischemic heart disease: Secondary | ICD-10-CM

## 2013-05-23 NOTE — Telephone Encounter (Signed)
Called [employer] assistant program to get refill on zetia for patient , here 3-5 days

## 2013-05-23 NOTE — Progress Notes (Signed)
[place] [place], [place]  [address] Phone: [phone] Fax:  [phone]  Date:  05/23/2013   ID:  Joe [name], DOB [birth year], MRN [id number]  PCP:  No primary provider on file.  Cardiologist:  Fransico Him, MD     History of Present Illness: Joe [name] is a 58 y.o. male with a history of ASCAD, ischemic DCM with AICD, chronic systolic CHF class I, dyslipidemia, obesity and OSA on CPAP who presents today for followup. He is doing well. He denies any chest pain, SOB, DOE, LE edema, dizziness, palpitations or syncope. He tolerates his CPAP well. He tolerates the mask and feels the pressure is adequate. He has no daytime sleepiness and feels refreshed when he gets up. He is complaining of muscle aches throughout his body and is concerned it may be his statin.  He goes to [employer] and walks on the treadmill 30 minutes daily.    Wt Readings from Last 3 Encounters:  05/23/13 237 lb (107.502 kg)  03/15/13 239 lb (108.41 kg)  11/24/12 250 lb (113.399 kg)     Past Medical History  Diagnosis Date  . Ischemic cardiomyopathy     A. 01/09/2011 - s/p St. Jude Fortify ST VR [place] AICD  . VF (ventricular fibrillation)     arrest in setting of AMI 6/11  . Hyperlipidemia   . OSA (obstructive sleep apnea)     severe with AHI 26/hr now on CPAP at [facility]  . Coronary artery disease     s/p PCI mild LAD (BMS) in setting of AWMI complicated by Vfib arrest with 40-50% residual disesae in left circ  . Chronic systolic CHF (congestive heart failure), NYHA class 1     EF 37% by nuclear stress test    Current Outpatient Prescriptions  Medication Sig Dispense Refill  . aspirin 81 MG tablet Take 81 mg by mouth daily.        . carvedilol (COREG) 6.25 MG tablet TAKE 1 TABLET BY MOUTH TWICE DAILY WITH FOOD  60 tablet  0  . Choline Fenofibrate 135 MG capsule Take 1 capsule (135 mg total) by mouth daily.  30 capsule  5  . ezetimibe (ZETIA) 10 MG tablet Take 10 mg by  mouth daily.      . fish oil-omega-3 fatty acids 1000 MG capsule Take 4 g by mouth daily.       Marland Kitchen NITROSTAT 0.4 MG SL tablet TAKE AS DIRECTED AS NEEDED  25 tablet  3  . prasugrel (EFFIENT) 10 MG TABS Take 10 mg by mouth daily.      . rosuvastatin (CRESTOR) 10 MG tablet Take 10 mg by mouth daily.      . valsartan (DIOVAN) 80 MG tablet TAKE 1 TABLET BY MOUTH EVERY DAY  30 tablet  5   No current facility-administered medications for this visit.    Allergies:    Allergies  Allergen Reactions  . Imdur [Isosorbide Dinitrate]     headache    Social History:  The patient  reports that he quit smoking about 3 years ago. His smoking use included Cigarettes. He has a 34 pack-year smoking history. He has never used smokeless tobacco. He reports that he drinks alcohol. He reports that he does not use illicit drugs.   Family History:  The patient's family history includes Aneurysm in his mother.   ROS:  Please see the history of present illness.      All other systems reviewed  and negative.   PHYSICAL EXAM: VS:  BP 118/82  Pulse 74  Ht 6\' 1"  (1.854 m)  Wt 237 lb (107.502 kg)  BMI 31.27 kg/m2 Well nourished, well developed, in no acute distress HEENT: normal Neck: no JVD Cardiac:  normal S1, S2; RRR; no murmur Lungs:  clear to auscultation bilaterally, no wheezing, rhonchi or rales Abd: soft, nontender, no hepatomegaly Ext: no edema Skin: warm and dry Neuro:  CNs 2-12 intact, no focal abnormalities noted      ASSESSMENT AND PLAN:  1. ASCAD with no angina - continue ASA/Effient  2. Dyslipidemia - continue fish oil/Zetia/Trilipix  - he tried going back on 5mg  of Crestor and did not tolerate it so I will have him be seen in lipid clinic 3. OSA on CPAP - continue CPAP  - get download from DME  4. Ischemic DCM - continue ACE I/beta blocker  5. Chronic systolic CHF well compensated   Followup with me in 6 months    Signed, Fransico Him, MD 05/23/2013 10:02 AM

## 2013-05-23 NOTE — Patient Instructions (Signed)
Your physician recommends that you continue on your current medications as directed. Please refer to the Current Medication list given to you today.  You have been referred to the [facility] with [facility]. Please schedule before you leave today  Please bring you CPAP machine to [facility] to get a download to be sent to our office.   Your physician wants you to follow-up in: 6 months with Dr Mallie Snooks will receive a reminder letter in the mail two months in advance. If you don't receive a letter, please call our office to schedule the follow-up appointment.

## 2013-05-31 ENCOUNTER — Telehealth: Payer: Self-pay

## 2013-05-31 NOTE — Telephone Encounter (Signed)
Called patient to let him know that his zetia was here at the office

## 2013-06-10 ENCOUNTER — Telehealth: Payer: Self-pay

## 2013-06-10 NOTE — Telephone Encounter (Signed)
Called patient to let him know that his meds was here at the office

## 2013-06-16 ENCOUNTER — Other Ambulatory Visit: Payer: Self-pay | Admitting: Cardiology

## 2013-06-16 ENCOUNTER — Ambulatory Visit (INDEPENDENT_AMBULATORY_CARE_PROVIDER_SITE_OTHER): Payer: Medicare Other | Admitting: *Deleted

## 2013-06-16 DIAGNOSIS — I255 Ischemic cardiomyopathy: Secondary | ICD-10-CM

## 2013-06-16 DIAGNOSIS — I2589 Other forms of chronic ischemic heart disease: Secondary | ICD-10-CM

## 2013-06-16 LAB — MDC_IDC_ENUM_SESS_TYPE_REMOTE
Brady Statistic RV Percent Paced: 1 % — CL
Implantable Pulse Generator Serial Number: 1037513
Lead Channel Setting Pacing Pulse Width: 0.5 ms
Lead Channel Setting Sensing Sensitivity: 0.5 mV
MDC IDC SET LEADCHNL RV PACING AMPLITUDE: 2.5 V
MDC IDC SET ZONE DETECTION INTERVAL: 310 ms
MDC IDC SET ZONE DETECTION INTERVAL: 350 ms

## 2013-06-17 NOTE — Progress Notes (Signed)
Remote ICD transmission.   

## 2013-06-20 ENCOUNTER — Other Ambulatory Visit: Payer: Self-pay | Admitting: Cardiology

## 2013-06-20 ENCOUNTER — Other Ambulatory Visit (INDEPENDENT_AMBULATORY_CARE_PROVIDER_SITE_OTHER): Payer: Medicare Other

## 2013-06-20 DIAGNOSIS — I2510 Atherosclerotic heart disease of native coronary artery without angina pectoris: Secondary | ICD-10-CM | POA: Diagnosis not present

## 2013-06-20 LAB — LIPID PANEL
Cholesterol: 193 mg/dL (ref 0–200)
HDL: 49.9 mg/dL (ref >39.00)
LDL Cholesterol: 114 mg/dL — ABNORMAL HIGH (ref 0–99)
NonHDL: 143.1
TRIGLYCERIDES: 147 mg/dL (ref 0.0–149.0)
Total CHOL/HDL Ratio: 4
VLDL: 29.4 mg/dL (ref 0.0–40.0)

## 2013-06-20 LAB — HEPATIC FUNCTION PANEL
ALBUMIN: 4 g/dL (ref 3.5–5.2)
ALK PHOS: 45 U/L (ref 39–117)
ALT: 21 U/L (ref 0–53)
AST: 24 U/L (ref 0–37)
Bilirubin, Direct: 0.1 mg/dL (ref 0.0–0.3)
TOTAL PROTEIN: 6.3 g/dL (ref 6.0–8.3)
Total Bilirubin: 0.4 mg/dL (ref 0.2–1.2)

## 2013-06-21 ENCOUNTER — Other Ambulatory Visit: Payer: Medicare Other

## 2013-06-21 ENCOUNTER — Ambulatory Visit (INDEPENDENT_AMBULATORY_CARE_PROVIDER_SITE_OTHER): Payer: Medicare Other | Admitting: Pharmacist

## 2013-06-21 ENCOUNTER — Ambulatory Visit: Payer: Medicare Other | Admitting: Pharmacist

## 2013-06-21 VITALS — Wt 236.0 lb

## 2013-06-21 DIAGNOSIS — Z79899 Other long term (current) drug therapy: Secondary | ICD-10-CM | POA: Diagnosis not present

## 2013-06-21 DIAGNOSIS — E785 Hyperlipidemia, unspecified: Secondary | ICD-10-CM

## 2013-06-21 DIAGNOSIS — I2510 Atherosclerotic heart disease of native coronary artery without angina pectoris: Secondary | ICD-10-CM | POA: Diagnosis not present

## 2013-06-21 MED ORDER — SIMVASTATIN 5 MG PO TABS: 5.0000 mg | ORAL_TABLET | Freq: Every day | ORAL | Status: DC

## 2013-06-21 NOTE — Assessment & Plan Note (Addendum)
Patient understands importance of aggressive LDL management given his cardiac history, and is open to trying another statin.  He is concerned about starting on a high dose, so I think it is reasonable to start him on a low dose of simvastatin.  If he can't tolerate simvastatin 5 mg qhs, he would meet criteria for "statin intolerance" and could qualify for PCSK-9 inhibitor study if needed (no cost to him).  Cost was an issue for him with Zetia (on [employer] PAP now), so affording PCSK-9 inhibitors may be an issue.  Patient agrees to try and quit smoking over next few weeks.  He had side effects with Chantix in the past, so we is going to try and quit "cold [place]".  Will recheck labs in 3 months.  If he can't tolerate simvastatin 5 mg qhs, he will call me, at which time we may consider trying to enroll him into SPIRE-II. Plan: 1.  Start simvastatin 5 mg once daily.  Take in the evening.  Will titrate in the future if tolerating. 2.  Continue Zetia, fenofibrate, and fish oil. 3.  Recheck cholesterol in 3 months (09/15/13 - fasting lab, show up anytime after 7:30 am), and see Ysidro Evert next week on 09/20/13 at 10:00 am.

## 2013-06-21 NOTE — Patient Instructions (Signed)
1.  Start simvastatin 5 mg once daily.  Take in the evening. 2.  Continue Zetia, fenofibrate, and fish oil. 3.  Recheck cholesterol in 3 months (09/15/13 - fasting lab, show up anytime after 7:30 am), and see Ysidro Evert next week on 09/20/13 at 10:00 am.

## 2013-06-21 NOTE — Progress Notes (Signed)
Patient is a 58 y.o. WM referred to lipid clinic by Dr. Radford Pax given h/o CAD (06/2009) and inability to tolerate statins.  Patient took Crstor 20 mg for years (2011 to 2014) however eventually developed muscle aches.  He went off of this medication, and aches resolved.  He then tried to go back on just Crestor 5 mg daily, however the same muscle aches occurred.  He tells me that he also tried lipitor in the past, but it made him irritable, anxious, and "mean".  He is currently taking Zetia, Trilipix, and fish oil 4 g/d and tolerating well.  His last LDL was 114 mg/dL on this regimen 06/2013.  He had an NMR done 07/2012 which showed LDL of 70 and LDL-P number of 1351 while also on Crestor 20 mg qd.   Patient's TG were 280 mg/dL at time of his heart attack in 2011, and fibrate was started at that time.   RF:  CAD (06/2009), age - LDL goal < 70 mg/dL, non-HDL goal < 100 Meds:  Zetia 10 mg qd, Trlipix 135 mg qd, fish oil 4 g/d. Intolerant:  Crestor 5 mg and 20 mg qd (myalgias), Lipitor (irritable)  Family history:  Father died when patient was very young of what he thinks was heart disease.  Mother had an aneurysm.   Social history:  Drinks 6-12 beers per week.  Recently started smoking again - a little less than 1 ppd.  Plans on quitting again soon. Diet:  Patient tries to eat a low fat diet, however doesn't have a particular diet he sticks to.  Labs:   06/2013:  LDL 114, TC 193, HDL 29, TG 147, non-HDL 143 mg/dL - on Zetia 10 mg qd, Trilipix 135 mg qd, fish oil 4 g/d.  Current Outpatient Prescriptions  Medication Sig Dispense Refill  . aspirin 81 MG tablet Take 81 mg by mouth daily.        . carvedilol (COREG) 6.25 MG tablet TAKE 1 TABLET BY MOUTH TWICE DAILY WITH FOOD  60 tablet  4  . Choline Fenofibrate 135 MG capsule Take 1 capsule (135 mg total) by mouth daily.  30 capsule  5  . ezetimibe (ZETIA) 10 MG tablet Take 10 mg by mouth daily.      . fish oil-omega-3 fatty acids 1000 MG capsule Take 4 g by  mouth daily.       Marland Kitchen NITROSTAT 0.4 MG SL tablet TAKE AS DIRECTED AS NEEDED  25 tablet  3  . prasugrel (EFFIENT) 10 MG TABS Take 10 mg by mouth daily.      . valsartan (DIOVAN) 80 MG tablet TAKE 1 TABLET BY MOUTH EVERY DAY  30 tablet  5   No current facility-administered medications for this visit.   Allergies  Allergen Reactions  . Crestor [Rosuvastatin]     Muscle aches on 5 mg qd and 20 mg qd  . Imdur [Isosorbide Dinitrate]     headache  . Lipitor [Atorvastatin]     Made his irritable and anxious   Family History  Problem Relation Age of Onset  . Aneurysm Mother

## 2013-06-24 ENCOUNTER — Encounter: Payer: Self-pay | Admitting: Cardiology

## 2013-06-28 ENCOUNTER — Encounter: Payer: Self-pay | Admitting: Internal Medicine

## 2013-08-15 ENCOUNTER — Encounter: Payer: Self-pay | Admitting: Internal Medicine

## 2013-08-15 ENCOUNTER — Ambulatory Visit (INDEPENDENT_AMBULATORY_CARE_PROVIDER_SITE_OTHER): Payer: Medicare Other | Admitting: *Deleted

## 2013-08-15 DIAGNOSIS — I509 Heart failure, unspecified: Secondary | ICD-10-CM

## 2013-08-15 DIAGNOSIS — I2589 Other forms of chronic ischemic heart disease: Secondary | ICD-10-CM | POA: Diagnosis not present

## 2013-08-15 DIAGNOSIS — I255 Ischemic cardiomyopathy: Secondary | ICD-10-CM

## 2013-08-15 DIAGNOSIS — I5022 Chronic systolic (congestive) heart failure: Secondary | ICD-10-CM | POA: Diagnosis not present

## 2013-08-15 LAB — MDC_IDC_ENUM_SESS_TYPE_INCLINIC
Battery Remaining Longevity: 86.4 mo
HIGH POWER IMPEDANCE MEASURED VALUE: 73.125
Implantable Pulse Generator Serial Number: 1037513
Lead Channel Impedance Value: 562.5 Ohm
Lead Channel Pacing Threshold Amplitude: 1 V
Lead Channel Pacing Threshold Amplitude: 1 V
Lead Channel Pacing Threshold Pulse Width: 0.5 ms
Lead Channel Pacing Threshold Pulse Width: 0.5 ms
Lead Channel Sensing Intrinsic Amplitude: 11.8 mV
Lead Channel Setting Pacing Amplitude: 2.5 V
Lead Channel Setting Pacing Pulse Width: 0.5 ms
Lead Channel Setting Sensing Sensitivity: 0.5 mV
MDC IDC SESS DTM: 20150803092549
MDC IDC STAT BRADY RV PERCENT PACED: 0.04 %
Zone Setting Detection Interval: 310 ms
Zone Setting Detection Interval: 350 ms

## 2013-08-15 NOTE — Progress Notes (Signed)
ICD check in clinic (industry checked). Normal device function. Threshold and sensing consistent with previous device measurements. Impedance trends stable over time. No evidence of any ventricular arrhythmias. Histogram distribution appropriate for patient and level of activity. No changes made this session. Device programmed at appropriate safety margins. Device programmed to optimize intrinsic conduction. Estimated longevity 7.2-7.5 years. Pt enrolled in remote follow-up. Plan to follow up via [facility] on 11-4 and with GT in 03-2014.

## 2013-08-17 ENCOUNTER — Other Ambulatory Visit: Payer: Self-pay | Admitting: Cardiology

## 2013-09-01 ENCOUNTER — Other Ambulatory Visit: Payer: Self-pay

## 2013-09-01 ENCOUNTER — Other Ambulatory Visit: Payer: Self-pay | Admitting: General Surgery

## 2013-09-01 MED ORDER — EZETIMIBE 10 MG PO TABS: 10.0000 mg | ORAL_TABLET | Freq: Every day | ORAL | Status: DC

## 2013-09-02 NOTE — Progress Notes (Signed)
Patient ID: Joe [name], male   DOB: 06/07/1955, 58 y.o.   MRN: [id number] Order patient's zetia from [facility] [phone]. The [place] has his name spelled wrong Mack Guise) is the way it is spelled. RX # [id number] and confirmation # [id number]

## 2013-09-08 ENCOUNTER — Telehealth: Payer: Self-pay

## 2013-09-08 NOTE — Telephone Encounter (Signed)
Called patient to let him know that his zetia was here for him to pick up

## 2013-09-13 ENCOUNTER — Other Ambulatory Visit: Payer: Medicare Other

## 2013-09-15 ENCOUNTER — Other Ambulatory Visit: Payer: Medicare Other

## 2013-09-15 ENCOUNTER — Other Ambulatory Visit: Payer: Self-pay | Admitting: Cardiology

## 2013-09-15 MED ORDER — NITROGLYCERIN 0.4 MG SL SUBL: SUBLINGUAL_TABLET | SUBLINGUAL | Status: DC

## 2013-09-15 MED ORDER — VALSARTAN 80 MG PO TABS: ORAL_TABLET | ORAL | Status: DC

## 2013-09-15 MED ORDER — SIMVASTATIN 5 MG PO TABS: 5.0000 mg | ORAL_TABLET | Freq: Every day | ORAL | Status: DC

## 2013-09-20 ENCOUNTER — Ambulatory Visit: Payer: Medicare Other | Admitting: Pharmacist

## 2013-09-20 ENCOUNTER — Other Ambulatory Visit: Payer: Medicare Other

## 2013-09-21 ENCOUNTER — Other Ambulatory Visit (INDEPENDENT_AMBULATORY_CARE_PROVIDER_SITE_OTHER): Payer: Medicare Other

## 2013-09-21 DIAGNOSIS — E785 Hyperlipidemia, unspecified: Secondary | ICD-10-CM | POA: Diagnosis not present

## 2013-09-21 DIAGNOSIS — Z79899 Other long term (current) drug therapy: Secondary | ICD-10-CM | POA: Diagnosis not present

## 2013-09-21 LAB — HEPATIC FUNCTION PANEL
ALK PHOS: 44 U/L (ref 39–117)
ALT: 23 U/L (ref 0–53)
AST: 20 U/L (ref 0–37)
Albumin: 4.1 g/dL (ref 3.5–5.2)
BILIRUBIN DIRECT: 0 mg/dL (ref 0.0–0.3)
BILIRUBIN TOTAL: 0.9 mg/dL (ref 0.2–1.2)
Total Protein: 6.9 g/dL (ref 6.0–8.3)

## 2013-09-21 LAB — LIPID PANEL
CHOL/HDL RATIO: 4
Cholesterol: 178 mg/dL (ref 0–200)
HDL: 49 mg/dL (ref >39.00)
LDL Cholesterol: 90 mg/dL (ref 0–99)
NonHDL: 129
Triglycerides: 195 mg/dL — ABNORMAL HIGH (ref 0.0–149.0)
VLDL: 39 mg/dL (ref 0.0–40.0)

## 2013-09-22 ENCOUNTER — Ambulatory Visit (INDEPENDENT_AMBULATORY_CARE_PROVIDER_SITE_OTHER): Payer: Medicare Other | Admitting: Pharmacist

## 2013-09-22 VITALS — Wt 235.0 lb

## 2013-09-22 DIAGNOSIS — I2510 Atherosclerotic heart disease of native coronary artery without angina pectoris: Secondary | ICD-10-CM | POA: Diagnosis not present

## 2013-09-22 DIAGNOSIS — E785 Hyperlipidemia, unspecified: Secondary | ICD-10-CM

## 2013-09-22 DIAGNOSIS — Z79899 Other long term (current) drug therapy: Secondary | ICD-10-CM | POA: Diagnosis not present

## 2013-09-22 MED ORDER — SIMVASTATIN 10 MG PO TABS: 10.0000 mg | ORAL_TABLET | Freq: Every day | ORAL | Status: DC

## 2013-09-22 NOTE — Assessment & Plan Note (Signed)
LDL improved from 114 mg/dL down to 90 mg/dL, however would like another 20% LDL reduction if possible. Will have patient increase simvastatin to 10 mg once daily and reduce red meat and alcohol in diet.  If he develops muscle/joint aches on simvastatin 10 mg qd he will cut this back to 5 mg qd (1/2 pill) and call to let me know.  We could consider enrolling him into SPIRE clinical trial at that time.  Recheck lipid / liver in 3 months.

## 2013-09-22 NOTE — Patient Instructions (Signed)
1.  Increase simvastatin to 10 mg once daily in the evening.  If you start developing muscle aches, cut simvastatin dose in half and take 1/2 tablet daily.  Call Joe [name] to let him know as well [phone]) and we can discuss clinical trial medication. 2.  Recheck cholesterol and liver in 3 months (12/20/13 - lab opens at 7:30 am), and see Joe [name] 2 days later on 12/22/13 at 9:00 am.

## 2013-09-22 NOTE — Progress Notes (Signed)
Patient is a 58 y.o. WM referred to lipid clinic by Dr. Radford Pax given h/o CAD (06/2009) and inability to tolerate statins in the past.  I started him on simvastatin 5 mg qd in 06/2013 and he has been tolerating this over the past 3 months.  LDL dropped from 114 mg/dL down to 90 mg/dL.  In the past, patient took Crstor 20 mg for years (2011 to 2014) however eventually developed muscle aches.  He went off of this medication, and aches resolved.  He then tried to go back on just Crestor 5 mg daily, however the same muscle aches occurred.  He tells me that he also tried lipitor in the past, but it made him irritable, anxious, and "mean".  He is currently taking simvastatin 5 mg qd, Zetia, Trilipix, and fish oil 4 g/d and tolerating well.    He had an NMR done 07/2012 which showed LDL of 70 and LDL-P number of 1351 while on Crestor 20 mg qd.   Patient's TG were 280 mg/dL at time of his heart attack in 2011, and fibrate was started at that time.   RF:  CAD (06/2009), age - LDL goal < 70 mg/dL, non-HDL goal < 100 Meds:  Zetia 10 mg qd, Trlipix 135 mg qd, fish oil 4 g/d, simvastatin 5 mg qhs Intolerant:  Crestor 5 mg and 20 mg qd (myalgias), Lipitor (irritable)  Family history:  Father died when patient was very young of what he thinks was heart disease.  Mother had an aneurysm.   Social history:  Drinks 6-12 beers per week.  Recently started smoking again - a little less than 1 ppd.  Plans on quitting again soon. Diet:  Patient tries to eat a low fat diet, however doesn't have a particular diet he sticks to.  Patient has been on vacation for past few weeks off and on, so ate more red meat and drank more alcohol recently.  Could be reason TG trended up slightly.  Labs:   09/2013:  LDL 90, TC 178, HDL 49, TG 195, non-HDL 129, LFTs normal - on Simvastatin 5 mg qd, Zetia 10 mg qd, Trilipix 135 mg qd, fish oil 4 g/d 06/2013:  LDL 114, TC 193, HDL 29, TG 147, non-HDL 143 mg/dL - on Zetia 10 mg qd, Trilipix 135 mg qd, fish  oil 4 g/d.  Current Outpatient Prescriptions  Medication Sig Dispense Refill  . aspirin 81 MG tablet Take 81 mg by mouth daily.        . carvedilol (COREG) 6.25 MG tablet TAKE 1 TABLET BY MOUTH TWICE DAILY WITH FOOD  60 tablet  4  . Choline Fenofibrate (FENOFIBRIC ACID) 135 MG CPDR TAKE ONE CAPSULE BY MOUTH EVERY DAY  30 capsule  2  . ezetimibe (ZETIA) 10 MG tablet Take 1 tablet (10 mg total) by mouth daily.  90 tablet  3  . fish oil-omega-3 fatty acids 1000 MG capsule Take 4 g by mouth daily.       . nitroGLYCERIN (NITROSTAT) 0.4 MG SL tablet TAKE AS DIRECTED AS NEEDED  25 tablet  3  . prasugrel (EFFIENT) 10 MG TABS Take 10 mg by mouth daily.      . simvastatin (ZOCOR) 5 MG tablet Take 1 tablet (5 mg total) by mouth daily.  30 tablet  3  . valsartan (DIOVAN) 80 MG tablet TAKE 1 TABLET BY MOUTH EVERY DAY  30 tablet  3   No current facility-administered medications for this visit.   Allergies  Allergen Reactions  . Crestor [Rosuvastatin]     Muscle aches on 5 mg qd and 20 mg qd  . Imdur [Isosorbide Dinitrate]     headache  . Lipitor [Atorvastatin]     Made his irritable and anxious   Family History  Problem Relation Age of Onset  . Aneurysm Mother

## 2013-09-26 ENCOUNTER — Telehealth: Payer: Self-pay | Admitting: Pharmacist

## 2013-09-26 NOTE — Telephone Encounter (Signed)
He had an MI with Vfib arrest in 2011 and received a stent.  Ok to stop Effient but would like him to go on Plavix instead 75mg  daily

## 2013-09-26 NOTE — Telephone Encounter (Signed)
Patient called to ask if he was suppose to still be on Effient.  He says that he thought Dr. Radford Pax told him to stop this in the past and just take aspirin, however it is still on his med list, and per Dr. Theodosia Blender last office note he was to continue Effient 10 mg qd as well.  Patient wants to know if Dr. Radford Pax wants him to get back on this or not, and if so, he needs a refill sent to [facility] in [place].    To Dr. Radford Pax to address, then let Danielle know.

## 2013-09-27 NOTE — Telephone Encounter (Signed)
Pt stated he hasn't been on it for over a year but had continued his baby aspirin regimen. Will take off med list. Dr Radford Pax stated pt did not need to be on plavix.   PT is aware

## 2013-10-14 DIAGNOSIS — Z23 Encounter for immunization: Secondary | ICD-10-CM | POA: Diagnosis not present

## 2013-11-12 IMAGING — CR DG CHEST 2V
2 series · 2 of 2 positions shown · non-contrast
Comparison: None.

CLINICAL DATA: Post ICD placement

CHEST - 2 VIEW

[w chest pa]
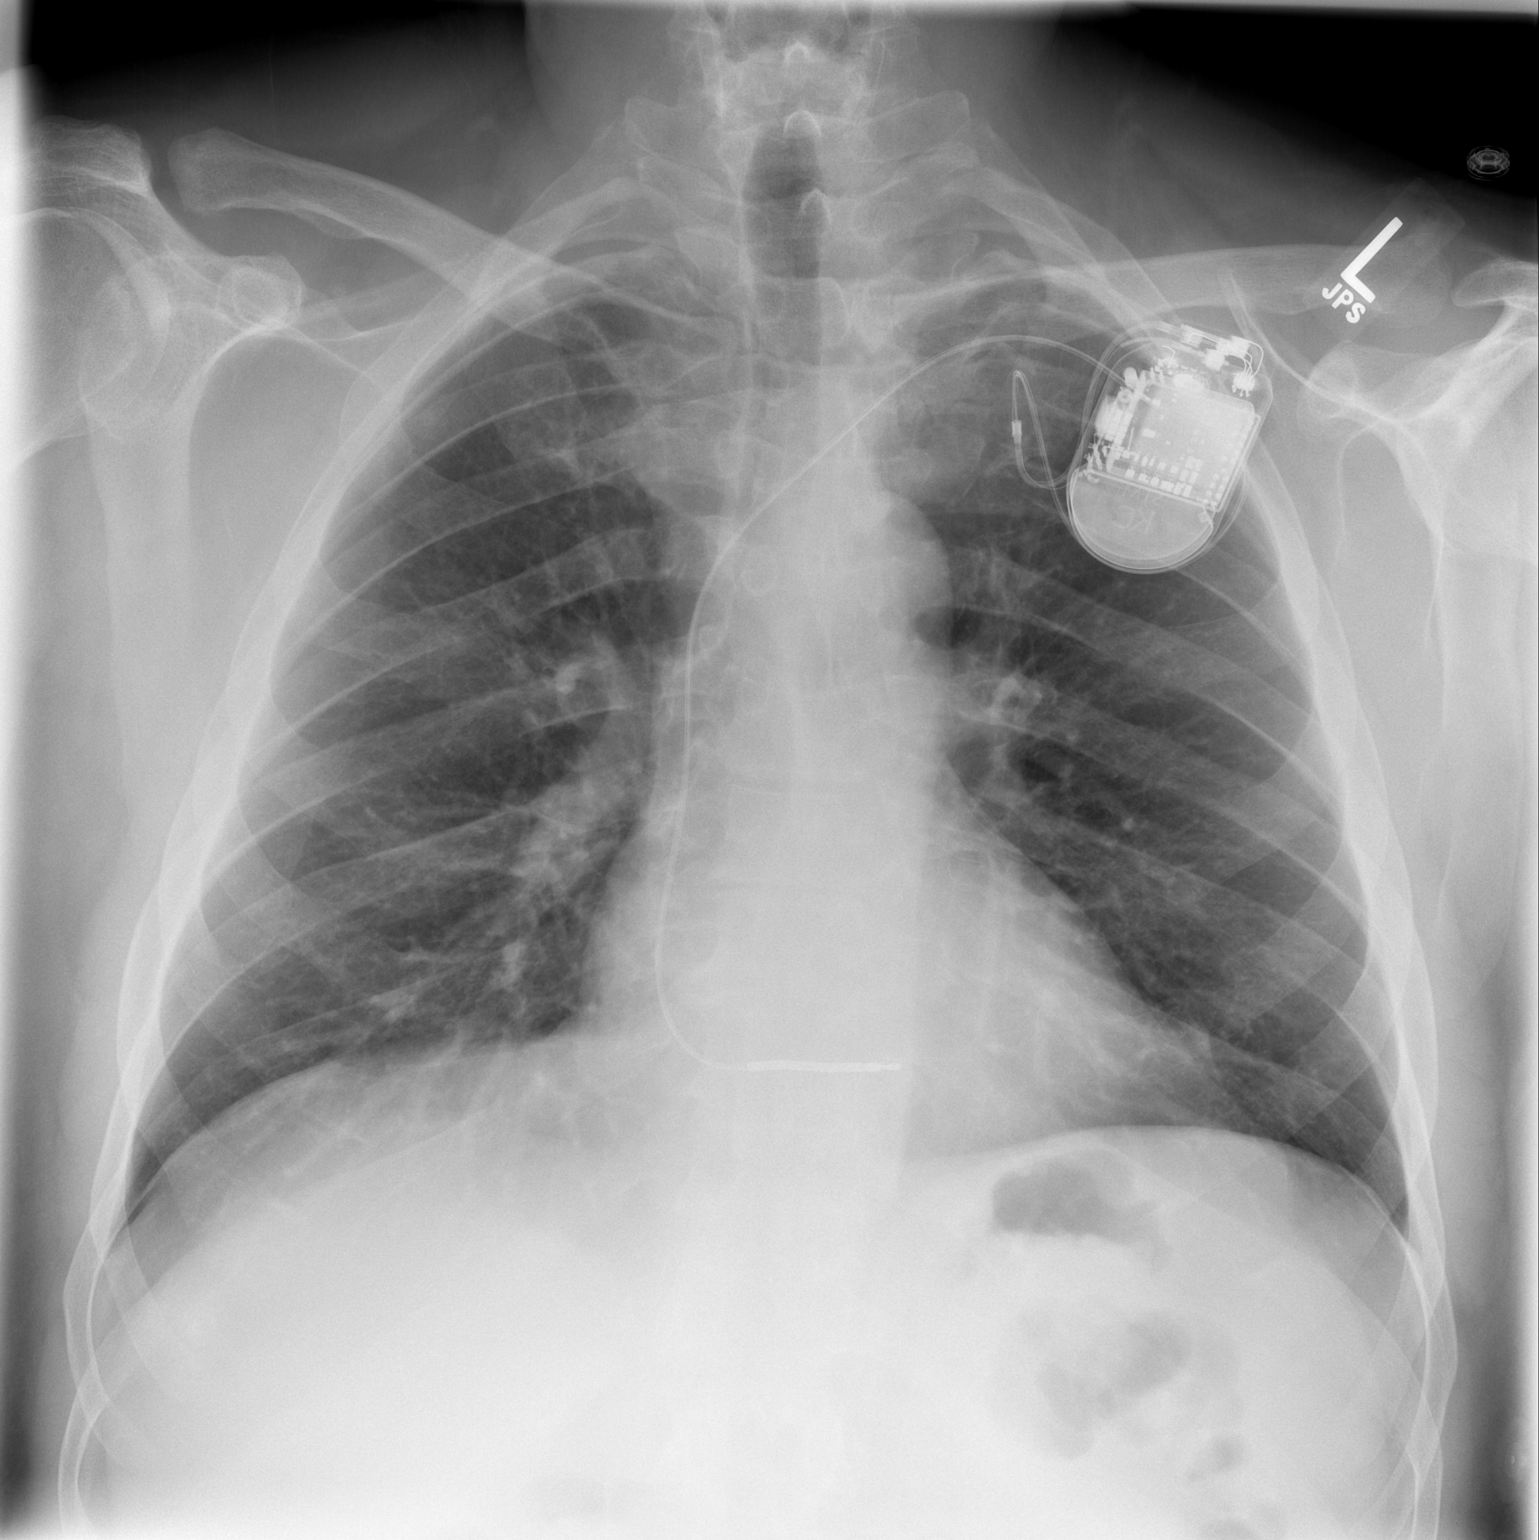

[w chest lat]
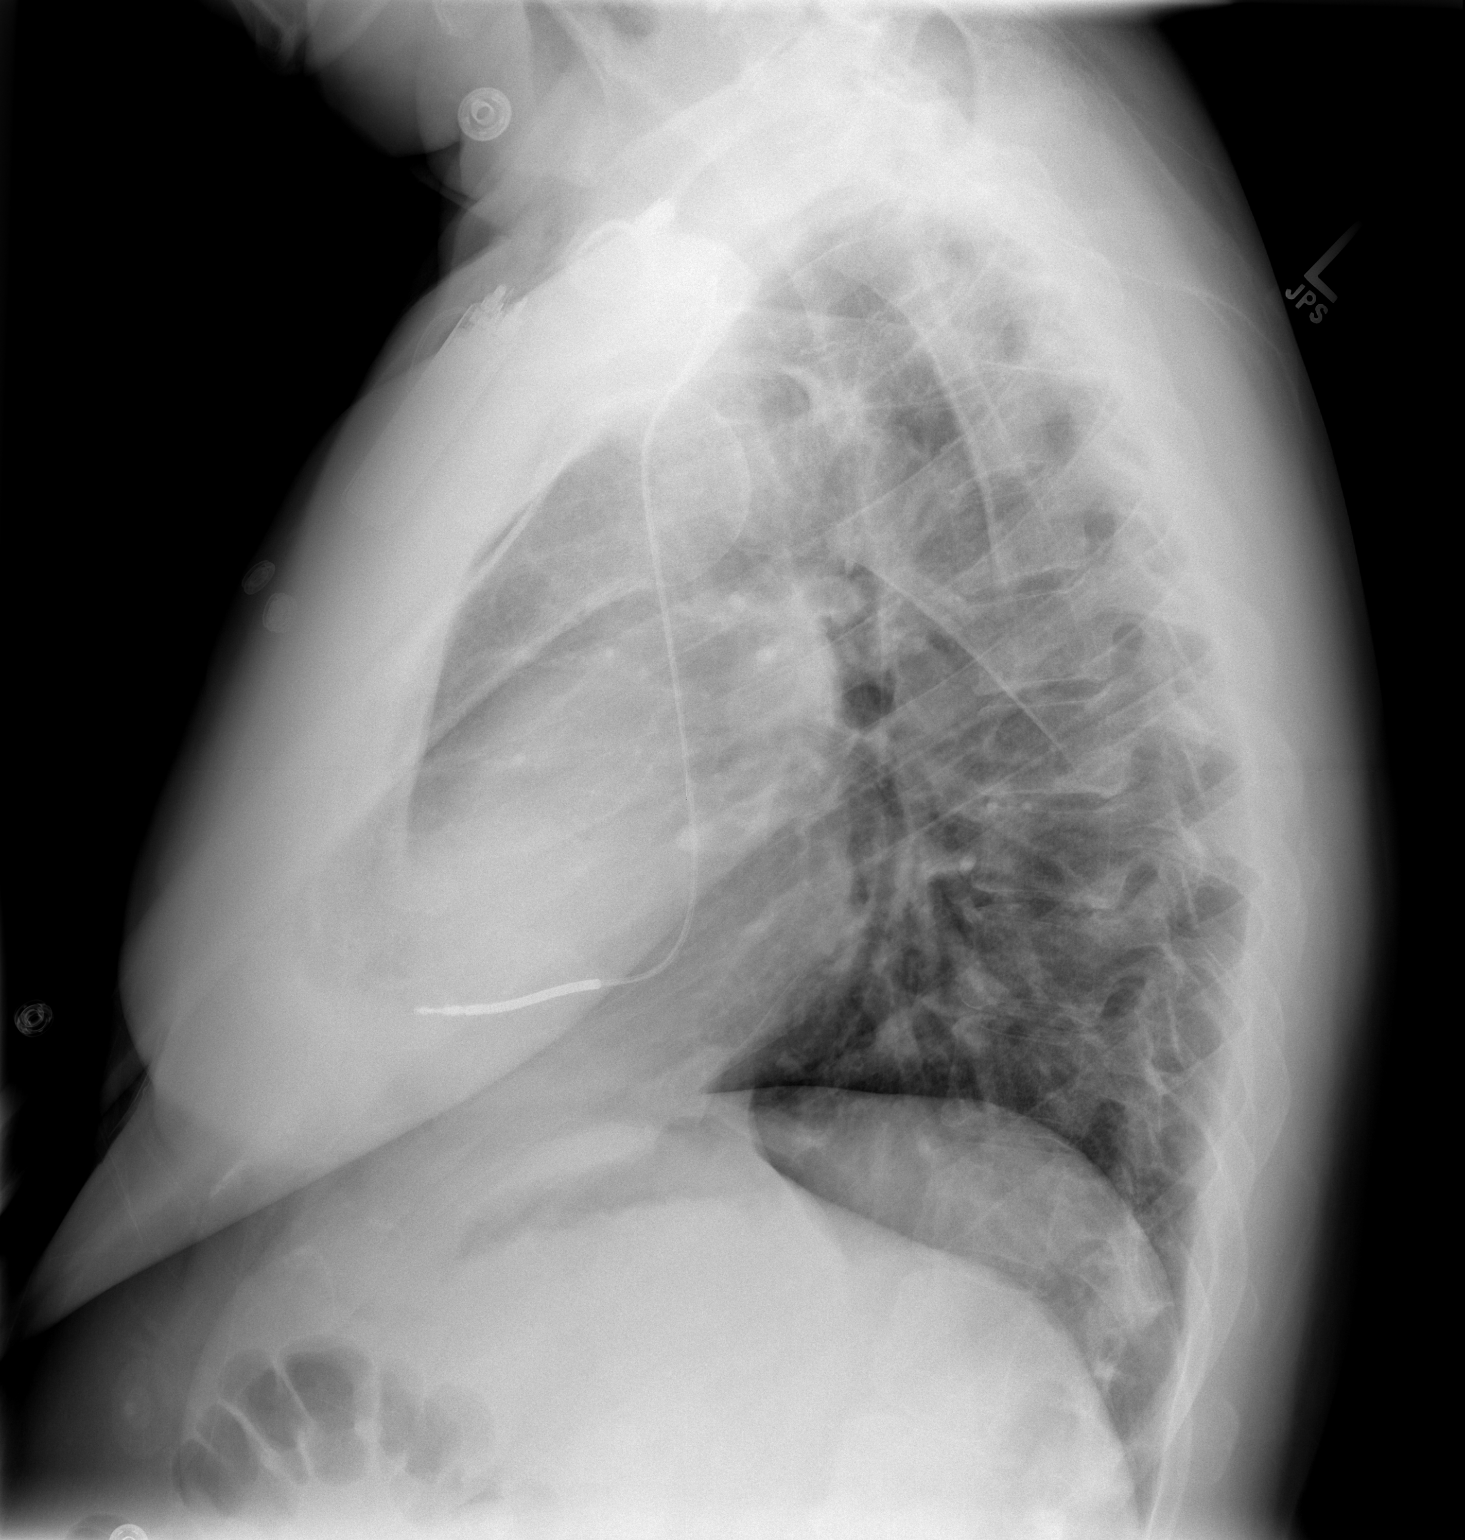

[2 of 2 positions shown; findings below may reference images not displayed]

FINDINGS: Lungs are essentially clear. No pleural effusion or
pneumothorax.

Cardiomediastinal silhouette is within normal limits.  Left
subclavian single lead ICD in satisfactory position.

Mild degenerative changes of the visualized thoracolumbar spine.
IMPRESSION: Left subclavian single lead ICD in satisfactory position.

## 2013-11-16 ENCOUNTER — Encounter: Payer: Self-pay | Admitting: Internal Medicine

## 2013-11-16 ENCOUNTER — Ambulatory Visit (INDEPENDENT_AMBULATORY_CARE_PROVIDER_SITE_OTHER): Payer: Medicare Other | Admitting: *Deleted

## 2013-11-16 DIAGNOSIS — I255 Ischemic cardiomyopathy: Secondary | ICD-10-CM | POA: Diagnosis not present

## 2013-11-16 LAB — MDC_IDC_ENUM_SESS_TYPE_REMOTE
Brady Statistic RV Percent Paced: 1 %
HIGH POWER IMPEDANCE MEASURED VALUE: 65 Ohm
Implantable Pulse Generator Serial Number: 1037513
Lead Channel Impedance Value: 460 Ohm
Lead Channel Sensing Intrinsic Amplitude: 11.8 mV
MDC IDC MSMT BATTERY REMAINING LONGEVITY: 84 mo
MDC IDC SET LEADCHNL RV PACING AMPLITUDE: 2.5 V
MDC IDC SET LEADCHNL RV PACING PULSEWIDTH: 0.5 ms
MDC IDC SET LEADCHNL RV SENSING SENSITIVITY: 0.5 mV
Zone Setting Detection Interval: 310 ms
Zone Setting Detection Interval: 350 ms

## 2013-11-16 NOTE — Progress Notes (Signed)
Remote ICD transmission.   

## 2013-11-21 ENCOUNTER — Other Ambulatory Visit: Payer: Self-pay | Admitting: Cardiology

## 2013-11-23 ENCOUNTER — Ambulatory Visit (INDEPENDENT_AMBULATORY_CARE_PROVIDER_SITE_OTHER): Payer: Medicare Other | Admitting: Cardiology

## 2013-11-23 ENCOUNTER — Telehealth: Payer: Self-pay

## 2013-11-23 ENCOUNTER — Other Ambulatory Visit: Payer: Self-pay

## 2013-11-23 VITALS — BP 138/84 | HR 61 | Ht 74.0 in | Wt 241.1 lb

## 2013-11-23 DIAGNOSIS — G4733 Obstructive sleep apnea (adult) (pediatric): Secondary | ICD-10-CM

## 2013-11-23 DIAGNOSIS — E785 Hyperlipidemia, unspecified: Secondary | ICD-10-CM | POA: Insufficient documentation

## 2013-11-23 DIAGNOSIS — I2510 Atherosclerotic heart disease of native coronary artery without angina pectoris: Secondary | ICD-10-CM | POA: Diagnosis not present

## 2013-11-23 DIAGNOSIS — I5022 Chronic systolic (congestive) heart failure: Secondary | ICD-10-CM

## 2013-11-23 DIAGNOSIS — I255 Ischemic cardiomyopathy: Secondary | ICD-10-CM

## 2013-11-23 DIAGNOSIS — I2583 Coronary atherosclerosis due to lipid rich plaque: Principal | ICD-10-CM

## 2013-11-23 LAB — BASIC METABOLIC PANEL
BUN: 14 mg/dL (ref 6–23)
CHLORIDE: 108 meq/L (ref 96–112)
CO2: 27 mEq/L (ref 19–32)
Calcium: 9.6 mg/dL (ref 8.4–10.5)
Creatinine, Ser: 0.9 mg/dL (ref 0.4–1.5)
GFR: 91.49 mL/min (ref >60.00)
GLUCOSE: 110 mg/dL — AB (ref 70–99)
Potassium: 3.9 mEq/L (ref 3.5–5.1)
Sodium: 142 mEq/L (ref 135–145)

## 2013-11-23 MED ORDER — EZETIMIBE 10 MG PO TABS: 10.0000 mg | ORAL_TABLET | Freq: Every day | ORAL | Status: DC

## 2013-11-23 MED ORDER — COENZYME Q10 30 MG PO CAPS: 150.0000 mg | ORAL_CAPSULE | Freq: Every day | ORAL | Status: DC

## 2013-11-23 NOTE — Telephone Encounter (Signed)
Called [employer] to order patient's zetia com # [phone]

## 2013-11-23 NOTE — Patient Instructions (Addendum)
Your physician recommends that you have lab work TODAY (BMET).   Your physician has recommended you make the following change in your medication:  1) START CoQ 10 150 mg daily  Your physician wants you to follow-up in: 6 months with Dr. Radford Pax. You will receive a reminder letter in the mail two months in advance. If you don't receive a letter, please call our office to schedule the follow-up appointment.

## 2013-11-23 NOTE — Progress Notes (Signed)
[place], [place], [place]  [address] Phone: [phone] Fax:  [phone]  Date:  11/23/2013   ID:  Joe [name], DOB 1955-07-25, MRN [id number]  PCP:  No primary care provider on file.  Cardiologist:  Fransico Him, MD    History of Present Illness: Joe [name] is a 58 y.o. male with a history of ASCAD, ischemic DCM with AICD, chronic systolic CHF class I, dyslipidemia, obesity and OSA on CPAP who presents today for followup. He is doing well. He denies any chest pain, SOB, DOE, LE edema, dizziness, palpitations or syncope. He tolerates his CPAP well. He tolerates the mask and feels the pressure is adequate. He occasionally has some nasal congestion.  He has no daytime sleepiness and feels refreshed when he gets up. He goes to [employer] and walks on the treadmill 30 minutes daily.    Wt Readings from Last 3 Encounters:  11/23/13 241 lb 1.9 oz (109.371 kg)  09/22/13 235 lb (106.595 kg)  06/21/13 236 lb (107.049 kg)     Past Medical History  Diagnosis Date  . Ischemic cardiomyopathy     A. 01/09/2011 - s/p St. Jude Fortify ST VR [place] AICD  . VF (ventricular fibrillation)     arrest in setting of AMI 6/11  . Hyperlipidemia   . OSA (obstructive sleep apnea)     severe with AHI 26/hr now on CPAP at [place]  . Coronary artery disease     s/p PCI mild LAD (BMS) in setting of AWMI complicated by Vfib arrest with 40-50% residual disesae in left circ  . Chronic systolic CHF (congestive heart failure), NYHA class 1     EF 37% by nuclear stress test    Current Outpatient Prescriptions  Medication Sig Dispense Refill  . aspirin 81 MG tablet Take 81 mg by mouth daily.      . carvedilol (COREG) 6.25 MG tablet TAKE 1 TABLET BY MOUTH TWICE DAILY WITH FOOD 60 tablet 0  . Choline Fenofibrate (FENOFIBRIC ACID) 135 MG CPDR TAKE ONE CAPSULE BY MOUTH EVERY DAY 30 capsule 2  . ezetimibe (ZETIA) 10 MG tablet Take 1 tablet (10 mg total) by mouth daily. 90 tablet 3    . fish oil-omega-3 fatty acids 1000 MG capsule Take 4 g by mouth daily.     . nitroGLYCERIN (NITROSTAT) 0.4 MG SL tablet TAKE AS DIRECTED AS NEEDED 25 tablet 3  . simvastatin (ZOCOR) 10 MG tablet Take 1 tablet (10 mg total) by mouth daily. 30 tablet 5  . valsartan (DIOVAN) 80 MG tablet TAKE 1 TABLET BY MOUTH EVERY DAY 30 tablet 3   No current facility-administered medications for this visit.    Allergies:    Allergies  Allergen Reactions  . Crestor [Rosuvastatin]     Muscle aches on 5 mg qd and 20 mg qd  . Imdur [Isosorbide Dinitrate]     headache  . Lipitor [Atorvastatin]     Made his irritable and anxious    Social History:  The patient  reports that he quit smoking about 4 years ago. His smoking use included Cigarettes. He has a 34 pack-year smoking history. He has never used smokeless tobacco. He reports that he drinks alcohol. He reports that he does not use illicit drugs.   Family History:  The patient's family history includes Aneurysm in his mother.   ROS:  Please see the history of present illness.      All other systems reviewed and  negative.   PHYSICAL EXAM: VS:  BP 138/84 mmHg  Pulse 61  Ht 6\' 2"  (1.88 m)  Wt 241 lb 1.9 oz (109.371 kg)  BMI 30.94 kg/m2 Well nourished, well developed, in no acute distress HEENT: normal Neck: no JVD Cardiac:  normal S1, S2; RRR; no murmur Lungs:  clear to auscultation bilaterally, no wheezing, rhonchi or rales Abd: soft, nontender, no hepatomegaly Ext: no edema Skin: warm and dry Neuro:  CNs 2-12 intact, no focal abnormalities noted  ASSESSMENT AND PLAN:  1. ASCAD with no angina - continue ASA 2. Dyslipidemia - LDL was 90 09/2013 - he is still having problems with muscle aches - continue fish oil/Zetia/simvastatin - add CoEnzyme Q10 daily - he has lipids pending next month 3. OSA on CPAP - continue CPAP  - get download from DME  4. Ischemic DCM - continue ARB/beta blocker  - check BMET 5. Chronic systolic CHF well  compensated  Followup with me in 6 months  Signed, Fransico Him, MD [facility] 11/23/2013 8:25 AM

## 2013-12-01 ENCOUNTER — Encounter: Payer: Self-pay | Admitting: Cardiology

## 2013-12-13 DIAGNOSIS — M5416 Radiculopathy, lumbar region: Secondary | ICD-10-CM | POA: Diagnosis not present

## 2013-12-13 DIAGNOSIS — M4806 Spinal stenosis, lumbar region: Secondary | ICD-10-CM | POA: Diagnosis not present

## 2013-12-13 DIAGNOSIS — M5136 Other intervertebral disc degeneration, lumbar region: Secondary | ICD-10-CM | POA: Diagnosis not present

## 2013-12-14 ENCOUNTER — Other Ambulatory Visit: Payer: Self-pay | Admitting: Cardiology

## 2013-12-20 ENCOUNTER — Other Ambulatory Visit: Payer: Medicare Other

## 2013-12-21 ENCOUNTER — Other Ambulatory Visit: Payer: Self-pay | Admitting: Cardiology

## 2013-12-21 ENCOUNTER — Encounter (HOSPITAL_COMMUNITY): Payer: Self-pay | Admitting: Internal Medicine

## 2013-12-22 ENCOUNTER — Other Ambulatory Visit (INDEPENDENT_AMBULATORY_CARE_PROVIDER_SITE_OTHER): Payer: Medicare Other | Admitting: *Deleted

## 2013-12-22 ENCOUNTER — Ambulatory Visit: Payer: Medicare Other | Admitting: Pharmacist

## 2013-12-22 DIAGNOSIS — E785 Hyperlipidemia, unspecified: Secondary | ICD-10-CM

## 2013-12-22 DIAGNOSIS — Z79899 Other long term (current) drug therapy: Secondary | ICD-10-CM

## 2013-12-22 LAB — LIPID PANEL
Cholesterol: 177 mg/dL (ref 0–200)
HDL: 49.5 mg/dL (ref >39.00)
LDL Cholesterol: 96 mg/dL (ref 0–99)
NonHDL: 127.5
TRIGLYCERIDES: 157 mg/dL — AB (ref 0.0–149.0)
Total CHOL/HDL Ratio: 4
VLDL: 31.4 mg/dL (ref 0.0–40.0)

## 2013-12-22 LAB — HEPATIC FUNCTION PANEL
ALK PHOS: 45 U/L (ref 39–117)
ALT: 35 U/L (ref 0–53)
AST: 27 U/L (ref 0–37)
Albumin: 4.2 g/dL (ref 3.5–5.2)
Bilirubin, Direct: 0 mg/dL (ref 0.0–0.3)
TOTAL PROTEIN: 6.7 g/dL (ref 6.0–8.3)
Total Bilirubin: 0.4 mg/dL (ref 0.2–1.2)

## 2013-12-29 DIAGNOSIS — M47816 Spondylosis without myelopathy or radiculopathy, lumbar region: Secondary | ICD-10-CM | POA: Diagnosis not present

## 2013-12-29 DIAGNOSIS — M47817 Spondylosis without myelopathy or radiculopathy, lumbosacral region: Secondary | ICD-10-CM | POA: Diagnosis not present

## 2013-12-29 DIAGNOSIS — M5137 Other intervertebral disc degeneration, lumbosacral region: Secondary | ICD-10-CM | POA: Diagnosis not present

## 2013-12-29 DIAGNOSIS — M5136 Other intervertebral disc degeneration, lumbar region: Secondary | ICD-10-CM | POA: Diagnosis not present

## 2013-12-29 DIAGNOSIS — M5126 Other intervertebral disc displacement, lumbar region: Secondary | ICD-10-CM | POA: Diagnosis not present

## 2013-12-29 DIAGNOSIS — M439 Deforming dorsopathy, unspecified: Secondary | ICD-10-CM | POA: Diagnosis not present

## 2014-01-19 DIAGNOSIS — M5136 Other intervertebral disc degeneration, lumbar region: Secondary | ICD-10-CM | POA: Diagnosis not present

## 2014-01-23 ENCOUNTER — Telehealth: Payer: Self-pay | Admitting: Cardiology

## 2014-01-23 NOTE — Telephone Encounter (Signed)
New message      Pt want to know if he can get his fenofibric thru the drug company like he gets his zetia.  He said he cannot afford to get it at the pharmacy.  Please call

## 2014-01-25 ENCOUNTER — Other Ambulatory Visit: Payer: Self-pay | Admitting: *Deleted

## 2014-01-25 DIAGNOSIS — E785 Hyperlipidemia, unspecified: Secondary | ICD-10-CM

## 2014-02-07 DIAGNOSIS — M5136 Other intervertebral disc degeneration, lumbar region: Secondary | ICD-10-CM | POA: Diagnosis not present

## 2014-02-07 DIAGNOSIS — G8929 Other chronic pain: Secondary | ICD-10-CM | POA: Diagnosis not present

## 2014-02-07 DIAGNOSIS — M9983 Other biomechanical lesions of lumbar region: Secondary | ICD-10-CM | POA: Diagnosis not present

## 2014-02-09 ENCOUNTER — Encounter: Payer: Self-pay | Admitting: Internal Medicine

## 2014-02-09 ENCOUNTER — Ambulatory Visit (INDEPENDENT_AMBULATORY_CARE_PROVIDER_SITE_OTHER): Payer: Commercial Managed Care - HMO | Admitting: Internal Medicine

## 2014-02-09 VITALS — BP 142/88 | HR 66 | Ht 74.0 in | Wt 246.6 lb

## 2014-02-09 DIAGNOSIS — Z4502 Encounter for adjustment and management of automatic implantable cardiac defibrillator: Secondary | ICD-10-CM | POA: Diagnosis not present

## 2014-02-09 DIAGNOSIS — Z95810 Presence of automatic (implantable) cardiac defibrillator: Secondary | ICD-10-CM

## 2014-02-09 DIAGNOSIS — I5022 Chronic systolic (congestive) heart failure: Secondary | ICD-10-CM

## 2014-02-09 DIAGNOSIS — I255 Ischemic cardiomyopathy: Secondary | ICD-10-CM | POA: Diagnosis not present

## 2014-02-09 LAB — MDC_IDC_ENUM_SESS_TYPE_INCLINIC
Battery Remaining Longevity: 81.6 mo
Brady Statistic RV Percent Paced: 0 %
Date Time Interrogation Session: 20160128104846
HighPow Impedance: 75 Ohm
Lead Channel Pacing Threshold Amplitude: 1 V
Lead Channel Pacing Threshold Amplitude: 1 V
Lead Channel Pacing Threshold Pulse Width: 0.5 ms
Lead Channel Pacing Threshold Pulse Width: 0.5 ms
Lead Channel Setting Pacing Pulse Width: 0.5 ms
Lead Channel Setting Sensing Sensitivity: 0.5 mV
MDC IDC MSMT LEADCHNL RV IMPEDANCE VALUE: 425 Ohm
MDC IDC MSMT LEADCHNL RV SENSING INTR AMPL: 11.8 mV
MDC IDC PG SERIAL: 1037513
MDC IDC SET LEADCHNL RV PACING AMPLITUDE: 2.5 V
MDC IDC SET ZONE DETECTION INTERVAL: 310 ms
Zone Setting Detection Interval: 350 ms

## 2014-02-09 NOTE — Assessment & Plan Note (Signed)
His CHF is well compensated. He is exercising regularly. No change in meds.

## 2014-02-09 NOTE — Progress Notes (Signed)
HPI [name] returns today for followup. He is a pleasant middle aged man with an ICM, chronic systolic heart failure, HTN, and s/p ICD implant. After his initial implant nearly a year ago, he had severe chest pressure at the ICD insertion site. He has had minimal improvement in his pain since then, though it has not worsened. No fever or chills.  Allergies  Allergen Reactions  . Crestor [Rosuvastatin]     Muscle aches on 5 mg qd and 20 mg qd  . Imdur [Isosorbide Dinitrate]     headache  . Lipitor [Atorvastatin]     Made his irritable and anxious     Current Outpatient Prescriptions  Medication Sig Dispense Refill  . aspirin 81 MG tablet Take 81 mg by mouth daily.      . carvedilol (COREG) 6.25 MG tablet TAKE 1 TABLET BY MOUTH TWICE DAILY WITH FOOD 60 tablet 5  . Choline Fenofibrate (FENOFIBRIC ACID) 135 MG CPDR TAKE ONE CAPSULE BY MOUTH EVERY DAY 30 capsule 6  . co-enzyme Q-10 30 MG capsule Take 5 capsules (150 mg total) by mouth daily.    Marland Kitchen ezetimibe (ZETIA) 10 MG tablet Take 1 tablet (10 mg total) by mouth daily. 90 tablet 3  . fish oil-omega-3 fatty acids 1000 MG capsule Take 4 g by mouth daily.     . nitroGLYCERIN (NITROSTAT) 0.4 MG SL tablet TAKE AS DIRECTED AS NEEDED 25 tablet 3  . simvastatin (ZOCOR) 10 MG tablet Take 1 tablet (10 mg total) by mouth daily. 30 tablet 5  . valsartan (DIOVAN) 80 MG tablet TAKE 1 TABLET BY MOUTH EVERY DAY 30 tablet 6   No current facility-administered medications for this visit.     Past Medical History  Diagnosis Date  . Ischemic cardiomyopathy     A. 01/09/2011 - s/p St. Jude Fortify ST VR [place] AICD  . VF (ventricular fibrillation)     arrest in setting of AMI 6/11  . Hyperlipidemia   . OSA (obstructive sleep apnea)     severe with AHI 26/hr now on CPAP at [facility]  . Coronary artery disease     s/p PCI mild LAD (BMS) in setting of AWMI complicated by Vfib arrest with 40-50% residual disesae in left circ  . Chronic systolic CHF  (congestive heart failure), NYHA class 1     EF 37% by nuclear stress test    ROS:   All systems reviewed and negative except as noted in the HPI.   Past Surgical History  Procedure Laterality Date  . Back surgery    . Back surgery    . Knee arthroscopy      right  . Pacemaker insertion  2012    St Jude  . Implantable cardioverter defibrillator implant N/A 01/09/2011    Procedure: IMPLANTABLE CARDIOVERTER DEFIBRILLATOR IMPLANT;  Surgeon: Evans Lance, MD;  Location: [facility] CATH LAB;  Service: Cardiovascular;  Laterality: N/A;     Family History  Problem Relation Age of Onset  . Aneurysm Mother      History   Social History  . Marital Status: Single    Spouse Name: N/A    Number of Children: N/A  . Years of Education: N/A   Occupational History  . Not on file.   Social History Main Topics  . Smoking status: Former Smoker -- 1.00 packs/day for 34 years    Types: Cigarettes    Quit date: 06/13/2009  . Smokeless tobacco: Never Used     Comment:  Socially  . Alcohol Use: 0.0 oz/week     Comment: occasional  . Drug Use: No  . Sexual Activity: Yes   Other Topics Concern  . Not on file   Social History Narrative     BP 142/88 mmHg  Pulse 66  Ht 6\' 2"  (1.88 m)  Wt 246 lb 9.6 oz (111.857 kg)  BMI 31.65 kg/m2  Physical Exam:  Well appearing middle aged man, NAD HEENT: Unremarkable Neck:  No JVD, no thyromegally Lungs:  Clear with no wheezes, rales, or rhonchi HEART:  Regular rate rhythm, no murmurs, no rubs, no clicks Abd:  soft, positive bowel sounds, no organomegally, no rebound, no guarding Ext:  2 plus pulses, no edema, no cyanosis, no clubbing Skin:  No rashes no nodules Neuro:  CN II through XII intact, motor grossly intact  DEVICE  Normal device function.  See PaceArt for details.   Assess/Plan:

## 2014-02-09 NOTE — Assessment & Plan Note (Signed)
He denies anginal symptoms. He will continue his current meds.  

## 2014-02-09 NOTE — Assessment & Plan Note (Signed)
His St. Jude ICD is working normally. Will recheck in several months. His pain around his device persists but is mild.

## 2014-02-09 NOTE — Patient Instructions (Addendum)
Your physician recommends that you continue on your current medications as directed. Please refer to the Current Medication list given to you today.  Your physician wants you to follow-up in: 6 months with research and device clinic.  You will receive a reminder letter in the mail two months in advance. If you don't receive a letter, please call our office to schedule the follow-up appointment.  Your physician wants you to follow-up in: 1 year with Dr. Lovena Le.  You will receive a reminder letter in the mail two months in advance. If you don't receive a letter, please call our office to schedule the follow-up appointment.

## 2014-02-17 ENCOUNTER — Encounter: Payer: Self-pay | Admitting: Internal Medicine

## 2014-02-27 ENCOUNTER — Encounter: Payer: Self-pay | Admitting: Cardiology

## 2014-03-22 ENCOUNTER — Telehealth: Payer: Self-pay

## 2014-03-22 NOTE — Telephone Encounter (Signed)
Patient called for [place] to order his zetia from the company his application had expired. So I sent him a new application to sign and send back to get a new application send in

## 2014-03-29 DIAGNOSIS — M5432 Sciatica, left side: Secondary | ICD-10-CM | POA: Diagnosis not present

## 2014-03-29 DIAGNOSIS — M5416 Radiculopathy, lumbar region: Secondary | ICD-10-CM | POA: Diagnosis not present

## 2014-04-03 ENCOUNTER — Telehealth: Payer: Self-pay

## 2014-04-03 NOTE — Telephone Encounter (Signed)
Patient call about his zetia patient assistants program, he states that he mailed it back on 3.15.16.  I have not gotten it back as of yet so I placed him some samples at the front desk

## 2014-04-11 ENCOUNTER — Other Ambulatory Visit: Payer: Self-pay | Admitting: Cardiology

## 2014-04-13 ENCOUNTER — Telehealth: Payer: Self-pay | Admitting: Internal Medicine

## 2014-04-13 NOTE — Telephone Encounter (Signed)
    1. Has your device fired? Yes 5- 10 minutes  2. Is you device beeping? no  3. Are you experiencing draining or swelling at device site? no  4. Are you calling to see if we received your device transmission? no  5. Have you passed out? no

## 2014-04-13 NOTE — Telephone Encounter (Signed)
Called patient- asked to send manual transmission. No episodes, no shock. Patient reports a shocking feeling in his chest, hot feeling up his arm and then felt "like my shirt was on fire". He reports he was pulling up on a wrench when he felt the shock. No chest pain, dizziness, SOB. Will ask Pamala Duffel, LPN to review transmission from research standpoint per pt request. Patient offered device clinic appt today- he doesn't think it will help. Patient instructed to call if felt again or if he has additional questions.

## 2014-04-14 ENCOUNTER — Other Ambulatory Visit: Payer: Self-pay | Admitting: Cardiology

## 2014-04-14 NOTE — Telephone Encounter (Signed)
Patient called about his zetia from [place], but it was not here has not seen his application for the assistants program. So i had him to fill out another one while he was here picking up more samples. And I mailed it in today

## 2014-04-21 DIAGNOSIS — M5416 Radiculopathy, lumbar region: Secondary | ICD-10-CM | POA: Diagnosis not present

## 2014-04-28 DIAGNOSIS — M5416 Radiculopathy, lumbar region: Secondary | ICD-10-CM | POA: Diagnosis not present

## 2014-05-05 DIAGNOSIS — M5416 Radiculopathy, lumbar region: Secondary | ICD-10-CM | POA: Diagnosis not present

## 2014-05-11 ENCOUNTER — Ambulatory Visit (INDEPENDENT_AMBULATORY_CARE_PROVIDER_SITE_OTHER): Payer: Commercial Managed Care - HMO | Admitting: *Deleted

## 2014-05-11 DIAGNOSIS — I255 Ischemic cardiomyopathy: Secondary | ICD-10-CM

## 2014-05-11 LAB — MDC_IDC_ENUM_SESS_TYPE_REMOTE
Brady Statistic RV Percent Paced: 1 % — CL
Implantable Pulse Generator Serial Number: 1037513
Lead Channel Sensing Intrinsic Amplitude: 11.8 mV
MDC IDC SET LEADCHNL RV PACING AMPLITUDE: 2.5 V
MDC IDC SET LEADCHNL RV PACING PULSEWIDTH: 0.5 ms
MDC IDC SET LEADCHNL RV SENSING SENSITIVITY: 0.5 mV
MDC IDC SET ZONE DETECTION INTERVAL: 350 ms
Zone Setting Detection Interval: 310 ms

## 2014-05-11 NOTE — Progress Notes (Signed)
Remote ICD transmission.   

## 2014-05-25 ENCOUNTER — Encounter: Payer: Self-pay | Admitting: Cardiology

## 2014-05-29 DIAGNOSIS — Z125 Encounter for screening for malignant neoplasm of prostate: Secondary | ICD-10-CM | POA: Diagnosis not present

## 2014-05-29 DIAGNOSIS — Z1211 Encounter for screening for malignant neoplasm of colon: Secondary | ICD-10-CM | POA: Diagnosis not present

## 2014-05-29 DIAGNOSIS — I2510 Atherosclerotic heart disease of native coronary artery without angina pectoris: Secondary | ICD-10-CM | POA: Diagnosis not present

## 2014-05-29 DIAGNOSIS — Z0000 Encounter for general adult medical examination without abnormal findings: Secondary | ICD-10-CM | POA: Diagnosis not present

## 2014-06-01 ENCOUNTER — Encounter: Payer: Self-pay | Admitting: Internal Medicine

## 2014-06-13 DIAGNOSIS — M4806 Spinal stenosis, lumbar region: Secondary | ICD-10-CM | POA: Diagnosis not present

## 2014-06-13 DIAGNOSIS — M4807 Spinal stenosis, lumbosacral region: Secondary | ICD-10-CM | POA: Diagnosis not present

## 2014-06-23 ENCOUNTER — Telehealth: Payer: Self-pay

## 2014-06-26 ENCOUNTER — Other Ambulatory Visit: Payer: Self-pay

## 2014-06-26 MED ORDER — FENOFIBRIC ACID 135 MG PO CPDR: 1.0000 | DELAYED_RELEASE_CAPSULE | Freq: Every day | ORAL | Status: DC

## 2014-06-28 ENCOUNTER — Telehealth: Payer: Self-pay | Admitting: Internal Medicine

## 2014-06-28 ENCOUNTER — Telehealth: Payer: Self-pay | Admitting: Cardiology

## 2014-06-28 ENCOUNTER — Other Ambulatory Visit (INDEPENDENT_AMBULATORY_CARE_PROVIDER_SITE_OTHER): Payer: Commercial Managed Care - HMO | Admitting: *Deleted

## 2014-06-28 DIAGNOSIS — E785 Hyperlipidemia, unspecified: Secondary | ICD-10-CM | POA: Diagnosis not present

## 2014-06-28 LAB — LIPID PANEL
Cholesterol: 176 mg/dL (ref 0–200)
HDL: 46 mg/dL (ref >39.00)
NonHDL: 130
TRIGLYCERIDES: 286 mg/dL — AB (ref 0.0–149.0)
Total CHOL/HDL Ratio: 4
VLDL: 57.2 mg/dL — ABNORMAL HIGH (ref 0.0–40.0)

## 2014-06-28 LAB — HEPATIC FUNCTION PANEL
ALK PHOS: 43 U/L (ref 39–117)
ALT: 26 U/L (ref 0–53)
AST: 21 U/L (ref 0–37)
Albumin: 4.3 g/dL (ref 3.5–5.2)
BILIRUBIN TOTAL: 0.5 mg/dL (ref 0.2–1.2)
Bilirubin, Direct: 0.1 mg/dL (ref 0.0–0.3)
Total Protein: 6.4 g/dL (ref 6.0–8.3)

## 2014-06-28 LAB — LDL CHOLESTEROL, DIRECT: Direct LDL: 98 mg/dL

## 2014-06-28 NOTE — Telephone Encounter (Signed)
Tenny Craw to fax surgical clearance sheet to [phone].

## 2014-06-28 NOTE — Telephone Encounter (Signed)
Walk in pt form-messgae left for nurse-gave to [facility]

## 2014-06-28 NOTE — Telephone Encounter (Signed)
New problem    Need to know if pt need to be seen before lumbar surgery. Please advise. They will be faxing over recommendation today.

## 2014-06-29 ENCOUNTER — Telehealth: Payer: Self-pay | Admitting: Cardiology

## 2014-06-29 NOTE — Telephone Encounter (Signed)
New Prob  Request for surgical clearance:  1. What type of surgery is being performed? Back surgery   2. When is this surgery scheduled? 6/22   3. Are there any medications that need to be held prior to surgery and how long? Calling to verify he can stop his ASA prior to procedure  4. Name of physician performing surgery? Dr. Alfred Levins   5. What is your office phone and fax number? Pt does not have this available

## 2014-06-30 ENCOUNTER — Telehealth: Payer: Self-pay | Admitting: Internal Medicine

## 2014-06-30 NOTE — Telephone Encounter (Signed)
Clearance received and given to Dr. Radford Pax.

## 2014-06-30 NOTE — Telephone Encounter (Signed)
Received [facility] evaluation form for Device Management gave to [facility].  06/30/14  ltd

## 2014-06-30 NOTE — Telephone Encounter (Signed)
Informed patient that clearance was received this afternoon and will be sent when Dr. Radford Pax completes it. Patient grateful for callback.

## 2014-07-04 NOTE — Telephone Encounter (Signed)
Patient has had no SOB or CP since last OV.  Informed patient Dr. Lovena Le and the [facility] has the mandatory paperwork to sign for device management during and after procedure.

## 2014-07-05 ENCOUNTER — Telehealth: Payer: Self-pay

## 2014-07-05 DIAGNOSIS — I10 Essential (primary) hypertension: Secondary | ICD-10-CM | POA: Diagnosis not present

## 2014-07-05 DIAGNOSIS — G4730 Sleep apnea, unspecified: Secondary | ICD-10-CM | POA: Diagnosis not present

## 2014-07-05 DIAGNOSIS — Z95810 Presence of automatic (implantable) cardiac defibrillator: Secondary | ICD-10-CM | POA: Diagnosis not present

## 2014-07-05 DIAGNOSIS — I252 Old myocardial infarction: Secondary | ICD-10-CM | POA: Diagnosis not present

## 2014-07-05 DIAGNOSIS — M5106 Intervertebral disc disorders with myelopathy, lumbar region: Secondary | ICD-10-CM | POA: Diagnosis not present

## 2014-07-05 DIAGNOSIS — M4806 Spinal stenosis, lumbar region: Secondary | ICD-10-CM | POA: Diagnosis not present

## 2014-07-05 DIAGNOSIS — M79605 Pain in left leg: Secondary | ICD-10-CM | POA: Diagnosis not present

## 2014-07-05 DIAGNOSIS — F17210 Nicotine dependence, cigarettes, uncomplicated: Secondary | ICD-10-CM | POA: Diagnosis not present

## 2014-07-05 DIAGNOSIS — Z7982 Long term (current) use of aspirin: Secondary | ICD-10-CM | POA: Diagnosis not present

## 2014-07-05 DIAGNOSIS — M5116 Intervertebral disc disorders with radiculopathy, lumbar region: Secondary | ICD-10-CM | POA: Diagnosis not present

## 2014-07-05 DIAGNOSIS — Z4689 Encounter for fitting and adjustment of other specified devices: Secondary | ICD-10-CM | POA: Diagnosis not present

## 2014-07-05 DIAGNOSIS — Z79899 Other long term (current) drug therapy: Secondary | ICD-10-CM | POA: Diagnosis not present

## 2014-07-05 DIAGNOSIS — M545 Low back pain: Secondary | ICD-10-CM | POA: Diagnosis not present

## 2014-07-05 NOTE — Telephone Encounter (Signed)
Called patient to let him know that his zetia was here from the [place]

## 2014-07-06 DIAGNOSIS — Z79899 Other long term (current) drug therapy: Secondary | ICD-10-CM | POA: Diagnosis not present

## 2014-07-06 DIAGNOSIS — Z7982 Long term (current) use of aspirin: Secondary | ICD-10-CM | POA: Diagnosis not present

## 2014-07-06 DIAGNOSIS — M5116 Intervertebral disc disorders with radiculopathy, lumbar region: Secondary | ICD-10-CM | POA: Diagnosis not present

## 2014-07-06 DIAGNOSIS — I252 Old myocardial infarction: Secondary | ICD-10-CM | POA: Diagnosis not present

## 2014-07-06 DIAGNOSIS — F17210 Nicotine dependence, cigarettes, uncomplicated: Secondary | ICD-10-CM | POA: Diagnosis not present

## 2014-07-06 DIAGNOSIS — Z95810 Presence of automatic (implantable) cardiac defibrillator: Secondary | ICD-10-CM | POA: Diagnosis not present

## 2014-07-06 DIAGNOSIS — I10 Essential (primary) hypertension: Secondary | ICD-10-CM | POA: Diagnosis not present

## 2014-07-06 DIAGNOSIS — M4806 Spinal stenosis, lumbar region: Secondary | ICD-10-CM | POA: Diagnosis not present

## 2014-07-06 DIAGNOSIS — G4730 Sleep apnea, unspecified: Secondary | ICD-10-CM | POA: Diagnosis not present

## 2014-07-07 DIAGNOSIS — Z79899 Other long term (current) drug therapy: Secondary | ICD-10-CM | POA: Diagnosis not present

## 2014-07-07 DIAGNOSIS — M5116 Intervertebral disc disorders with radiculopathy, lumbar region: Secondary | ICD-10-CM | POA: Diagnosis not present

## 2014-07-07 DIAGNOSIS — I252 Old myocardial infarction: Secondary | ICD-10-CM | POA: Diagnosis not present

## 2014-07-07 DIAGNOSIS — I10 Essential (primary) hypertension: Secondary | ICD-10-CM | POA: Diagnosis not present

## 2014-07-07 DIAGNOSIS — F17210 Nicotine dependence, cigarettes, uncomplicated: Secondary | ICD-10-CM | POA: Diagnosis not present

## 2014-07-07 DIAGNOSIS — Z95810 Presence of automatic (implantable) cardiac defibrillator: Secondary | ICD-10-CM | POA: Diagnosis not present

## 2014-07-07 DIAGNOSIS — Z7982 Long term (current) use of aspirin: Secondary | ICD-10-CM | POA: Diagnosis not present

## 2014-07-07 DIAGNOSIS — M4806 Spinal stenosis, lumbar region: Secondary | ICD-10-CM | POA: Diagnosis not present

## 2014-07-07 DIAGNOSIS — G4730 Sleep apnea, unspecified: Secondary | ICD-10-CM | POA: Diagnosis not present

## 2014-07-10 ENCOUNTER — Telehealth: Payer: Self-pay

## 2014-07-10 NOTE — Telephone Encounter (Signed)
Prior auth/Tier Exception for Fenofibrate 178mcg caps. Sent to [facility] via Cover My Meds.

## 2014-07-12 NOTE — Telephone Encounter (Signed)
Tier Exc

## 2014-07-20 ENCOUNTER — Other Ambulatory Visit: Payer: Self-pay

## 2014-07-20 ENCOUNTER — Other Ambulatory Visit: Payer: Self-pay | Admitting: Cardiology

## 2014-07-20 MED ORDER — CARVEDILOL 6.25 MG PO TABS: 6.2500 mg | ORAL_TABLET | Freq: Two times a day (BID) | ORAL | Status: DC

## 2014-07-27 ENCOUNTER — Telehealth: Payer: Self-pay | Admitting: *Deleted

## 2014-07-27 NOTE — Telephone Encounter (Signed)
Patient called to follow up on tier exception/prior auth on fenofibrate.

## 2014-08-01 ENCOUNTER — Telehealth: Payer: Self-pay

## 2014-08-01 NOTE — Telephone Encounter (Signed)
Letter from [facility] stating Fenofibrate would not be given a TiER REDUCTION. Patient informed of this. Doesn't know if he can afford $80.00/month for 30 days supply. He will speak to Dr. Radford Pax at next [place].

## 2014-08-10 ENCOUNTER — Other Ambulatory Visit: Payer: Self-pay | Admitting: Cardiology

## 2014-08-14 DIAGNOSIS — K648 Other hemorrhoids: Secondary | ICD-10-CM | POA: Diagnosis not present

## 2014-08-14 DIAGNOSIS — Z950 Presence of cardiac pacemaker: Secondary | ICD-10-CM | POA: Diagnosis not present

## 2014-08-14 DIAGNOSIS — K5730 Diverticulosis of large intestine without perforation or abscess without bleeding: Secondary | ICD-10-CM | POA: Diagnosis not present

## 2014-08-14 DIAGNOSIS — I2510 Atherosclerotic heart disease of native coronary artery without angina pectoris: Secondary | ICD-10-CM | POA: Diagnosis not present

## 2014-08-14 DIAGNOSIS — Z1211 Encounter for screening for malignant neoplasm of colon: Secondary | ICD-10-CM | POA: Diagnosis not present

## 2014-08-14 DIAGNOSIS — Z955 Presence of coronary angioplasty implant and graft: Secondary | ICD-10-CM | POA: Diagnosis not present

## 2014-08-14 DIAGNOSIS — D122 Benign neoplasm of ascending colon: Secondary | ICD-10-CM | POA: Diagnosis not present

## 2014-08-14 DIAGNOSIS — D123 Benign neoplasm of transverse colon: Secondary | ICD-10-CM | POA: Diagnosis not present

## 2014-08-14 DIAGNOSIS — D125 Benign neoplasm of sigmoid colon: Secondary | ICD-10-CM | POA: Diagnosis not present

## 2014-09-04 ENCOUNTER — Telehealth: Payer: Self-pay

## 2014-09-04 NOTE — Telephone Encounter (Signed)
Pt called up here asking for Rose J. About his zetia medication. Vaughan Basta RN said he had zetia from the patient assistance program up front at check in waiting for him to pick up. I explained this to patient and he stated he has about 10 tablets left so he may come by today to pickup or wait until his appointment on 09/12/2014 to pick up.

## 2014-09-12 ENCOUNTER — Other Ambulatory Visit: Payer: Self-pay | Admitting: Cardiology

## 2014-09-12 ENCOUNTER — Ambulatory Visit (INDEPENDENT_AMBULATORY_CARE_PROVIDER_SITE_OTHER): Payer: Commercial Managed Care - HMO | Admitting: Internal Medicine

## 2014-09-12 ENCOUNTER — Encounter: Payer: Self-pay | Admitting: Internal Medicine

## 2014-09-12 ENCOUNTER — Encounter: Payer: Self-pay | Admitting: *Deleted

## 2014-09-12 VITALS — BP 162/88 | HR 66 | Ht 75.0 in | Wt 244.0 lb

## 2014-09-12 DIAGNOSIS — Z006 Encounter for examination for normal comparison and control in clinical research program: Secondary | ICD-10-CM

## 2014-09-12 DIAGNOSIS — I5022 Chronic systolic (congestive) heart failure: Secondary | ICD-10-CM | POA: Diagnosis not present

## 2014-09-12 DIAGNOSIS — M25752 Osteophyte, left hip: Secondary | ICD-10-CM | POA: Diagnosis not present

## 2014-09-12 DIAGNOSIS — M25552 Pain in left hip: Secondary | ICD-10-CM | POA: Diagnosis not present

## 2014-09-12 DIAGNOSIS — I255 Ischemic cardiomyopathy: Secondary | ICD-10-CM

## 2014-09-12 DIAGNOSIS — Z95810 Presence of automatic (implantable) cardiac defibrillator: Secondary | ICD-10-CM

## 2014-09-12 DIAGNOSIS — M1612 Unilateral primary osteoarthritis, left hip: Secondary | ICD-10-CM | POA: Diagnosis not present

## 2014-09-12 LAB — CUP PACEART INCLINIC DEVICE CHECK
Battery Remaining Longevity: 76.8 mo
Brady Statistic RV Percent Paced: 0 %
Date Time Interrogation Session: 20160830091756
HIGH POWER IMPEDANCE MEASURED VALUE: 72 Ohm
Lead Channel Impedance Value: 487.5 Ohm
Lead Channel Pacing Threshold Amplitude: 0.75 V
Lead Channel Sensing Intrinsic Amplitude: 11.8 mV
Lead Channel Setting Pacing Amplitude: 2.5 V
MDC IDC MSMT LEADCHNL RV PACING THRESHOLD AMPLITUDE: 0.75 V
MDC IDC MSMT LEADCHNL RV PACING THRESHOLD PULSEWIDTH: 0.5 ms
MDC IDC MSMT LEADCHNL RV PACING THRESHOLD PULSEWIDTH: 0.5 ms
MDC IDC PG SERIAL: 1037513
MDC IDC SET LEADCHNL RV PACING PULSEWIDTH: 0.5 ms
MDC IDC SET LEADCHNL RV SENSING SENSITIVITY: 0.5 mV
MDC IDC SET ZONE DETECTION INTERVAL: 310 ms
Zone Setting Detection Interval: 350 ms

## 2014-09-12 NOTE — Progress Notes (Signed)
Analyze ST Final Research Visit- Pt seen in device clinic. Device checked by industry and research. No ST alerts. Research ST thresholds turned off per protocol. Pt released from research study [place] and transferred to [facility] for ongoing remote monitoring. EKG WNL reviewed by Dr. Lovena Le. BP 162/88 HR 66.

## 2014-09-12 NOTE — Assessment & Plan Note (Signed)
He remains active no anginal symptoms.

## 2014-09-12 NOTE — Assessment & Plan Note (Signed)
He is well compensated. No chest pain or sob. He will continue his current meds. He will maintain a low sodium diet.

## 2014-09-12 NOTE — Progress Notes (Signed)
HPI [name] returns today for followup. He is a pleasant middle aged man with an ICM, chronic systolic heart failure, HTN, and s/p ICD implant. After his initial implant almost 4 years ago, he had severe chest pressure at the ICD insertion site. He has had improvement in his pain since then, though it has not worsened. No fever or chills.  In the interim, he has undergone back surgery and still is having some hip pain. No syncope or ICD shocks. Allergies  Allergen Reactions  . Crestor [Rosuvastatin]     Muscle aches on 5 mg qd and 20 mg qd  . Imdur [Isosorbide Dinitrate]     headache  . Lipitor [Atorvastatin]     Made his irritable and anxious     Current Outpatient Prescriptions  Medication Sig Dispense Refill  . aspirin 81 MG tablet Take 81 mg by mouth daily.      . carvedilol (COREG) 6.25 MG tablet Take 1 tablet (6.25 mg total) by mouth 2 (two) times daily with a meal. 60 tablet 0  . Choline Fenofibrate (FENOFIBRIC ACID) 135 MG CPDR Take 1 capsule by mouth daily. 30 capsule 1  . co-enzyme Q-10 30 MG capsule Take 5 capsules (150 mg total) by mouth daily.    Marland Kitchen ezetimibe (ZETIA) 10 MG tablet Take 1 tablet (10 mg total) by mouth daily. 90 tablet 3  . fish oil-omega-3 fatty acids 1000 MG capsule Take 4 g by mouth daily.     . nitroGLYCERIN (NITROSTAT) 0.4 MG SL tablet Place 0.4 mg under the tongue every 5 (five) minutes as needed for chest pain. Up to 3 doses    . simvastatin (ZOCOR) 10 MG tablet TAKE 1 TABLET BY MOUTH EVERY DAY 30 tablet 11  . valsartan (DIOVAN) 80 MG tablet TAKE 1 TABLET BY MOUTH EVERY DAY 30 tablet 6   No current facility-administered medications for this visit.     Past Medical History  Diagnosis Date  . Ischemic cardiomyopathy     A. 01/09/2011 - s/p St. Jude Fortify ST VR [place] AICD  . VF (ventricular fibrillation)     arrest in setting of AMI 6/11  . Hyperlipidemia   . OSA (obstructive sleep apnea)     severe with AHI 26/hr now on CPAP at [place]  .  Coronary artery disease     s/p PCI mild LAD (BMS) in setting of AWMI complicated by Vfib arrest with 40-50% residual disesae in left circ  . Chronic systolic CHF (congestive heart failure), NYHA class 1     EF 37% by nuclear stress test    ROS:   All systems reviewed and negative except as noted in the HPI.   Past Surgical History  Procedure Laterality Date  . Back surgery    . Back surgery    . Knee arthroscopy      right  . Pacemaker insertion  2012    St Jude  . Implantable cardioverter defibrillator implant N/A 01/09/2011    Procedure: IMPLANTABLE CARDIOVERTER DEFIBRILLATOR IMPLANT;  Surgeon: Evans Lance, MD;  Location: [facility] CATH LAB;  Service: Cardiovascular;  Laterality: N/A;     Family History  Problem Relation Age of Onset  . Aneurysm Mother      Social History   Social History  . Marital Status: Single    Spouse Name: N/A  . Number of Children: N/A  . Years of Education: N/A   Occupational History  . Not on file.   Social History Main  Topics  . Smoking status: Former Smoker -- 1.00 packs/day for 34 years    Types: Cigarettes    Quit date: 06/13/2009  . Smokeless tobacco: Never Used     Comment: Socially  . Alcohol Use: 0.0 oz/week     Comment: occasional  . Drug Use: No  . Sexual Activity: Yes   Other Topics Concern  . Not on file   Social History Narrative     BP 162/88 mmHg  Pulse 66  Ht 6\' 3"  (1.905 m)  Wt 244 lb (110.678 kg)  BMI 30.50 kg/m2  Physical Exam:  Well appearing middle aged man, NAD HEENT: Unremarkable Neck:  No JVD, no thyromegally Lungs:  Clear with no wheezes, rales, or rhonchi, minimal tenderness at ICD insertion site. HEART:  Regular rate rhythm, no murmurs, no rubs, no clicks Abd:  soft, positive bowel sounds, no organomegally, no rebound, no guarding Ext:  2 plus pulses, no edema, no cyanosis, no clubbing Skin:  No rashes no nodules Neuro:  CN II through XII intact, motor grossly intact  DEVICE  Normal  device function.  See PaceArt for details.   Assess/Plan:

## 2014-09-12 NOTE — Assessment & Plan Note (Signed)
His St. Jude ICD is working normally. Will recheck in several months.  

## 2014-09-12 NOTE — Patient Instructions (Signed)
Medication Instructions:  Your physician recommends that you continue on your current medications as directed. Please refer to the Current Medication list given to you today.   Labwork: None ordered  Testing/Procedures: None ordered  Follow-Up: Your physician wants you to follow-up in: 12 months with Dr Knox Saliva will receive a reminder letter in the mail two months in advance. If you don't receive a letter, please call our office to schedule the follow-up appointment.  Remote monitoring is used to monitor your ICD from home. This monitoring reduces the number of office visits required to check your device to one time per year. It allows [place] to keep an eye on the functioning of your device to ensure it is working properly. You are scheduled for a device check from home on 12/12/14. You may send your transmission at any time that day. If you have a wireless device, the transmission will be sent automatically. After your physician reviews your transmission, you will receive a postcard with your next transmission date.    Any Other Special Instructions Will Be Listed Below (If Applicable).

## 2014-10-09 ENCOUNTER — Other Ambulatory Visit: Payer: Self-pay | Admitting: Cardiology

## 2014-10-24 DIAGNOSIS — Z23 Encounter for immunization: Secondary | ICD-10-CM | POA: Diagnosis not present

## 2014-10-25 DIAGNOSIS — M4316 Spondylolisthesis, lumbar region: Secondary | ICD-10-CM | POA: Diagnosis not present

## 2014-10-25 DIAGNOSIS — M47817 Spondylosis without myelopathy or radiculopathy, lumbosacral region: Secondary | ICD-10-CM | POA: Diagnosis not present

## 2014-10-25 DIAGNOSIS — M5136 Other intervertebral disc degeneration, lumbar region: Secondary | ICD-10-CM | POA: Diagnosis not present

## 2014-10-25 DIAGNOSIS — M5126 Other intervertebral disc displacement, lumbar region: Secondary | ICD-10-CM | POA: Diagnosis not present

## 2014-10-25 DIAGNOSIS — M4806 Spinal stenosis, lumbar region: Secondary | ICD-10-CM | POA: Diagnosis not present

## 2014-11-03 ENCOUNTER — Encounter: Payer: Self-pay | Admitting: Internal Medicine

## 2014-11-03 ENCOUNTER — Other Ambulatory Visit: Payer: Self-pay | Admitting: Cardiology

## 2014-11-07 ENCOUNTER — Other Ambulatory Visit: Payer: Self-pay | Admitting: Cardiology

## 2014-11-07 DIAGNOSIS — M5416 Radiculopathy, lumbar region: Secondary | ICD-10-CM | POA: Diagnosis not present

## 2014-11-10 ENCOUNTER — Telehealth: Payer: Self-pay | Admitting: Cardiology

## 2014-11-10 NOTE — Telephone Encounter (Signed)
Spoke with patient and Joe [name] Per [place] drug discussed previously is now covered by insurance Scheduled appointment with Joe [name], patient aware of date and time

## 2014-11-10 NOTE — Telephone Encounter (Signed)
New Message  Pt called states that he was supposed to undergo a research or study to check his LCD's? Sending this to Triage for assistance.

## 2014-11-15 ENCOUNTER — Ambulatory Visit (INDEPENDENT_AMBULATORY_CARE_PROVIDER_SITE_OTHER): Payer: Commercial Managed Care - HMO | Admitting: Pharmacist

## 2014-11-15 ENCOUNTER — Other Ambulatory Visit: Payer: Self-pay | Admitting: Cardiology

## 2014-11-15 DIAGNOSIS — E785 Hyperlipidemia, unspecified: Secondary | ICD-10-CM

## 2014-11-15 MED ORDER — FENOFIBRATE 160 MG PO TABS: 160.0000 mg | ORAL_TABLET | Freq: Every day | ORAL | Status: DC

## 2014-11-15 MED ORDER — ROSUVASTATIN CALCIUM 5 MG PO TABS: ORAL_TABLET | ORAL | Status: DC

## 2014-11-15 NOTE — Progress Notes (Signed)
Patient ID: Joe [name]                 DOB: 1955/07/14, 59 yo                      MRN: [id number]     HPI: Joe [name] is a 59 y.o. male patient referred to lipid clinic by Dr. Radford Pax. PMH is significant for CAD s/p MI and PCI to the mid LAD (06/2009), HLD, iCMP, CHF, and is s/p ICD placement. Pt was previously seen by Ysidro Evert in lipid clinic in 2015. In the past, patient took Crestor 20mg  for years (2011 to 2014) but eventually developed muscle aches. He went off of this, and aches resolved. He then tried to restart Crestor 5mg  daily, but the same aches occurred. He also reports that he tried Lipitor in the past, but that it made him irritable, anxious, and mean. He had an NMR done 07/2012 that showed LDL of 70 and LDL-P of 1351 while on Crestor 20mg  daily. His TG were 280 at the time of his heart attack in 2011 and a fibrate was started at that time.  Patient previously had [place] insurance and PCSK9 inhibitor therapy was not covering ASCVD. Since then, they have expanded coverage but patient switched insurance to [facility] and has Medicare now. Copay for PCSK9 inhibitor therapy would likely be a few hundred dollars per month. Patient reports that he is on a fixed income and would rather retry a statin at this time. Given that his LDL is 98, would likely be able to bring him to goal with different statin therapy if he can tolerate it.   He also mentions that his Trilipix costs over $60 a month. He brought in a [place] book with him today and noted that his current fibrate is a Tier 4, whereas fenofibrate 160mg  generic is a Tier 2. Will switch this today.  Current Medications: Zetia 10mg  daily, Trilipix 135mg  daily, fish oil 4g daily, simvastatin 10mg  daily Intolerances: atorvastatin (irritable), Crestor 5mg  daily and 20mg  daily (myalgias), simvastatin 20mg  and higher Risk Factors: CAD s/p MI and PCI to the mid LAD (06/2009), sex LDL goal: 70mg /dL, non-HDL goal 100mg /dL  Diet:  Eats most meals at home Breakfast: breakfast bar Lunch: leftovers Dinner: chicken, Location manager, cornbread, pinto beans Snacks: potato chips Drinks: water, alcohol. No soda  Exercise: Tries to walk but has back and leg pain. Kayaks frequently.  Family History: Father died when patient was very young of what he thinks was heart disease. Mother had an aneurysm.  Social History: Pt drinks beer or wine most nights a week, former smoker. Smoked 1 PPD for 34 years, quit June 2011. Pt denies drug use.  Labs: 06/2014: TC 176, TG 286, HDL 46, LDL-D 98, LFTs wnl (on Zetia 10mg ,Trilipix 135mg , fish oil 4g, simvastatin 10mg ). Pt was on vacation the week prior and drank more alcohol - may have caused elevated TG 09/2013: TC 193, TG 147, HDL 29, LDL 114, LFTs wnl (Zetia 10mg , Trilipix 135mg , fish oil 4g)   Past Medical History  Diagnosis Date  . Ischemic cardiomyopathy     A. 01/09/2011 - s/p St. Jude Fortify ST VR [place] AICD  . VF (ventricular fibrillation)     arrest in setting of AMI 6/11  . Hyperlipidemia   . OSA (obstructive sleep apnea)     severe with AHI 26/hr now on CPAP at [place]  . Coronary artery disease  s/p PCI mild LAD (BMS) in setting of AWMI complicated by Vfib arrest with 40-50% residual disesae in left circ  . Chronic systolic CHF (congestive heart failure), NYHA class 1     EF 37% by nuclear stress test    Current Outpatient Prescriptions on File Prior to Visit  Medication Sig Dispense Refill  . aspirin 81 MG tablet Take 81 mg by mouth daily.      . carvedilol (COREG) 6.25 MG tablet TAKE 1 TABLET BY MOUTH TWICE DAILY WITH A MEAL.**NEEDS APPOINTMENT FOR FURTHER REFILLS** 180 tablet 0  . co-enzyme Q-10 30 MG capsule Take 5 capsules (150 mg total) by mouth daily.    Marland Kitchen ezetimibe (ZETIA) 10 MG tablet Take 1 tablet (10 mg total) by mouth daily. 90 tablet 3  . fish oil-omega-3 fatty acids 1000 MG capsule Take 4 g by mouth daily.     . nitroGLYCERIN (NITROSTAT) 0.4 MG SL tablet  Place 0.4 mg under the tongue every 5 (five) minutes as needed for chest pain. Up to 3 doses    . simvastatin (ZOCOR) 10 MG tablet TAKE 1 TABLET BY MOUTH EVERY DAY 30 tablet 11  . valsartan (DIOVAN) 80 MG tablet TAKE 1 TABLET BY MOUTH EVERY DAY 30 tablet 0   No current facility-administered medications on file prior to visit.    Allergies  Allergen Reactions  . Crestor [Rosuvastatin]     Muscle aches on 5 mg qd and 20 mg qd  . Imdur [Isosorbide Dinitrate]     headache  . Lipitor [Atorvastatin]     Made his irritable and anxious    Assessment/Plan:  1. Hyperlipidemia - Patient with LDL above goal 70mg /dL given history of ASCVD. Patient currently taking Trilipix 135mg , simvastatin 10mg , Zetia 10mg , and fish oil 4g daily. Discussed PCSK9 inhibitor therapy with patient today. Because he has Medicare, copay would likely be a few hundred dollars per month. He would rather retry a statin at this time. Will d/c simvastatin 10mg  an initiate Crestor 5mg  TIW. He is intolerant to 5mg  daily but thinks that he may do better with therapy since he has not tried it in a while and is taking CoQ10. Advised that he can take 200mg  CoQ10 a day to help with any cramping. TG elevated above goal 150mg /dL however pt reports drinking more alcohol before that lipid panel. Pt already on fish oil 4g and fibrate, patient will work on limiting alcohol. Also switched Trilipix 135mg  to different generic fenofibrate 160mg  daily for cheaper copay (Tier 2 vs. Tier 4 with Trilipix). Scheduled f/u lipid panel in 3 months. Advised pt to call clinic beforehand if he cannot tolerate Crestor TIW. Pt in agreement with plan.   Derra Shartzer E. Kainat Pizana, PharmD [facility] [address], [place], [place] [address] Phone: [phone]; Fax: [phone] 11/15/2014 10:45 AM

## 2014-11-15 NOTE — Patient Instructions (Signed)
Start taking fenofibrate 160mg  a day - I sent over a prescription that should be cheaper than your other fenofibrate 135mg  Pick up Crestor (rosuvastatin) and start taking 5mg  3 times a week.  You can increase your Coenzyme Q 10 to 200mg  a day to help with any cramping. Stop taking your simvastatin. Call lipid clinic [phone] if you have any problems or develop muscle aches on the Crestor. Recheck labs in 3 months on Thursday, February 2. Lab opens at 7:30am, come in any time after for a fasting panel

## 2014-12-06 ENCOUNTER — Other Ambulatory Visit: Payer: Self-pay | Admitting: Cardiology

## 2014-12-11 ENCOUNTER — Other Ambulatory Visit: Payer: Self-pay | Admitting: Cardiology

## 2014-12-12 ENCOUNTER — Ambulatory Visit (INDEPENDENT_AMBULATORY_CARE_PROVIDER_SITE_OTHER): Payer: Commercial Managed Care - HMO | Admitting: *Deleted

## 2014-12-12 DIAGNOSIS — I255 Ischemic cardiomyopathy: Secondary | ICD-10-CM

## 2014-12-14 DIAGNOSIS — M5416 Radiculopathy, lumbar region: Secondary | ICD-10-CM | POA: Diagnosis not present

## 2014-12-14 DIAGNOSIS — M5136 Other intervertebral disc degeneration, lumbar region: Secondary | ICD-10-CM | POA: Diagnosis not present

## 2014-12-14 DIAGNOSIS — M4806 Spinal stenosis, lumbar region: Secondary | ICD-10-CM | POA: Diagnosis not present

## 2014-12-14 NOTE — Progress Notes (Signed)
Remote ICD transmission.   

## 2014-12-25 LAB — CUP PACEART REMOTE DEVICE CHECK
Date Time Interrogation Session: 20161212101238
Implantable Lead Location: 753860
Lead Channel Setting Pacing Amplitude: 2.5 V
MDC IDC LEAD IMPLANT DT: 20121227
MDC IDC LEAD MODEL: 7122
MDC IDC PG SERIAL: 1037513
MDC IDC SET LEADCHNL RV PACING PULSEWIDTH: 0.5 ms
MDC IDC SET LEADCHNL RV SENSING SENSITIVITY: 0.5 mV

## 2014-12-26 ENCOUNTER — Encounter: Payer: Self-pay | Admitting: Cardiology

## 2014-12-26 ENCOUNTER — Other Ambulatory Visit: Payer: Self-pay | Admitting: Cardiology

## 2014-12-26 ENCOUNTER — Other Ambulatory Visit: Payer: Self-pay | Admitting: *Deleted

## 2014-12-26 MED ORDER — EZETIMIBE 10 MG PO TABS: 10.0000 mg | ORAL_TABLET | Freq: Every day | ORAL | Status: DC

## 2015-01-08 ENCOUNTER — Other Ambulatory Visit: Payer: Self-pay | Admitting: Cardiology

## 2015-01-10 ENCOUNTER — Telehealth: Payer: Self-pay

## 2015-01-10 NOTE — Telephone Encounter (Signed)
Zetia 10mg  re-ordered through [facility]. Confirmation # [id number].

## 2015-01-24 ENCOUNTER — Telehealth: Payer: Self-pay

## 2015-01-24 NOTE — Telephone Encounter (Signed)
Patient called me about his zetia, it was order on 12.28.16 and no one could find it. I found it in the secretary office in the mail. I called him and told him it was here and he ask about his patient assistance program. I told him it was to be renew some time in March.I also told Vaughan Basta  In refills about all of this.

## 2015-02-15 ENCOUNTER — Other Ambulatory Visit: Payer: Self-pay | Admitting: *Deleted

## 2015-02-15 ENCOUNTER — Other Ambulatory Visit (INDEPENDENT_AMBULATORY_CARE_PROVIDER_SITE_OTHER): Payer: Commercial Managed Care - HMO | Admitting: *Deleted

## 2015-02-15 DIAGNOSIS — E785 Hyperlipidemia, unspecified: Secondary | ICD-10-CM

## 2015-02-15 LAB — LIPID PANEL
CHOLESTEROL: 173 mg/dL (ref 125–200)
HDL: 42 mg/dL (ref >=40)
LDL CALC: 104 mg/dL (ref <130)
Total CHOL/HDL Ratio: 4.1 Ratio (ref <=5.0)
Triglycerides: 133 mg/dL (ref <150)
VLDL: 27 mg/dL (ref <30)

## 2015-02-15 LAB — HEPATIC FUNCTION PANEL
ALK PHOS: 42 U/L (ref 40–115)
ALT: 35 U/L (ref 9–46)
AST: 29 U/L (ref 10–35)
Albumin: 4.5 g/dL (ref 3.6–5.1)
BILIRUBIN DIRECT: 0.1 mg/dL (ref <=0.2)
BILIRUBIN INDIRECT: 0.4 mg/dL (ref 0.2–1.2)
Total Bilirubin: 0.5 mg/dL (ref 0.2–1.2)
Total Protein: 6.7 g/dL (ref 6.1–8.1)

## 2015-02-15 NOTE — Addendum Note (Signed)
Addended by: Eulis Foster on: 02/15/2015 07:49 AM   Modules accepted: Orders

## 2015-02-20 ENCOUNTER — Telehealth: Payer: Self-pay

## 2015-02-20 DIAGNOSIS — E785 Hyperlipidemia, unspecified: Secondary | ICD-10-CM

## 2015-02-20 NOTE — Telephone Encounter (Signed)
-----   Message from Aris Georgia, [facility] sent at 02/16/2015 10:22 AM EST ----- LDL above goal.  Currently taking Crestor 5mg  three days a week.  Would suggest increasing to 5 days of the week and if he can tolerate, 7 days of the week.  Recheck labs in 3 months.

## 2015-02-20 NOTE — Telephone Encounter (Signed)
Informed patient of results and verbal understanding expressed.  Patient st he never stopped his simvastatin or started Crestor. Instructed patient to STOP SIMVASTATIN and START CRESTOR 5 mg 3 times weekly per Jinny Blossom, RPH's OV note. Patient to have fasting labs drawn at [facility] with Dr. Radford Pax 4/21. Patient agrees with treatment plan.

## 2015-02-28 ENCOUNTER — Telehealth: Payer: Self-pay | Admitting: Cardiology

## 2015-02-28 NOTE — Telephone Encounter (Signed)
New message  Pt called states that he that he is having problems with medication   Pt c/o medication issue:  1. Name of Medication: rosuvastatin (CRESTOR) 5 MG tablet   4. What is your medication issue? Pt states that he feels like he is very sore and has flu like symptoms

## 2015-02-28 NOTE — Telephone Encounter (Signed)
Calling stating he started taking Crestor 5 mg on 2/7 and for past few days has had severe aching in his leg, hips and hands.  States he was on Crestor years ago and had to stop for same reason.  Was on Simvastatin until they changed and didn't have any problems with the medication. Spoke w/Sally Hedy Camara, pharmacist/lipid clinic who advises to stop Crestor for a week and call us back with results to see if symptoms have subsided. He verbalizes understanding and will call back next Thursday 03/08/15.

## 2015-03-05 ENCOUNTER — Telehealth: Payer: Self-pay | Admitting: Cardiology

## 2015-03-05 MED ORDER — SIMVASTATIN 10 MG PO TABS: 10.0000 mg | ORAL_TABLET | Freq: Every day | ORAL | Status: DC

## 2015-03-05 NOTE — Telephone Encounter (Signed)
Joe [name] is calling because he wants to know because he was taken off of the Crestor and want to know if he should start back with the Simvastatin .

## 2015-03-05 NOTE — Telephone Encounter (Signed)
Ok to restart simvastatin 10mg  daily

## 2015-03-05 NOTE — Telephone Encounter (Signed)
Patient spoke with [facility] RN last week with c/o severe aching in hips, legs and hands he related to Crestor (see 2/15 phone encounter). Patient was instructed to stop Crestor and call back in a week to give an update on symptoms. As of today, his symptoms have subsided. He has no complaints of aching.  He would like to restart simvastatin again instead.  To Lipid Clinic.

## 2015-03-05 NOTE — Telephone Encounter (Signed)
Instructed patient to STOP CRESTOR and START SIMVASTATIN 10 mg daily. Repeat labs will be drawn April 21. Patient agrees with treatment plan.

## 2015-03-13 ENCOUNTER — Telehealth: Payer: Self-pay | Admitting: Cardiology

## 2015-03-13 ENCOUNTER — Ambulatory Visit (INDEPENDENT_AMBULATORY_CARE_PROVIDER_SITE_OTHER): Payer: Commercial Managed Care - HMO | Admitting: *Deleted

## 2015-03-13 DIAGNOSIS — I255 Ischemic cardiomyopathy: Secondary | ICD-10-CM | POA: Diagnosis not present

## 2015-03-13 NOTE — Telephone Encounter (Signed)
Spoke with pt and reminded pt of remote transmission that is due today. Pt verbalized understanding.   

## 2015-03-14 NOTE — Progress Notes (Signed)
Remote ICD transmission.   

## 2015-03-16 ENCOUNTER — Other Ambulatory Visit: Payer: Self-pay | Admitting: Cardiology

## 2015-04-10 ENCOUNTER — Ambulatory Visit: Payer: Commercial Managed Care - HMO | Admitting: Cardiology

## 2015-04-16 ENCOUNTER — Encounter: Payer: Self-pay | Admitting: Cardiology

## 2015-04-16 LAB — CUP PACEART REMOTE DEVICE CHECK
Battery Remaining Percentage: 63 %
HIGH POWER IMPEDANCE MEASURED VALUE: 64 Ohm
Implantable Lead Location: 753860
Lead Channel Impedance Value: 450 Ohm
Lead Channel Setting Pacing Amplitude: 2.5 V
Lead Channel Setting Pacing Pulse Width: 0.5 ms
Lead Channel Setting Sensing Sensitivity: 0.5 mV
MDC IDC LEAD IMPLANT DT: 20121227
MDC IDC LEAD MODEL: 7122
MDC IDC MSMT BATTERY REMAINING LONGEVITY: 73 mo
MDC IDC MSMT LEADCHNL RV SENSING INTR AMPL: 11.8 mV
MDC IDC PG SERIAL: 1037513
MDC IDC SESS DTM: 20170403092443
MDC IDC STAT BRADY RV PERCENT PACED: 1 % — AB

## 2015-04-30 ENCOUNTER — Telehealth: Payer: Self-pay

## 2015-04-30 NOTE — Telephone Encounter (Signed)
Joe [name] CALLED ME THIS MORNING AND WAS ASKING ABOUT HIS NEW APPLICATION FOR ZETIA. I TOLD HIM THAT  I WOULD HAVE TO CALL [employer] AND I WOULD CALL HIM BACK. AFTER TALKING TO [employer], THEY SAID THAT THEY HAVE NOT GOTTEN THE NEW APPLICATION YET . I TOLD THEM THAT I MAILED IT ABOUT 2-3 WEEKS AGO. THEY TOLD ME TO CALL BACK AT THE END OF THE WEEK. I WILL CALL THE PATIENT BACK TO LET HIM KNOW.

## 2015-05-04 ENCOUNTER — Encounter: Payer: Self-pay | Admitting: Cardiology

## 2015-05-04 ENCOUNTER — Other Ambulatory Visit (INDEPENDENT_AMBULATORY_CARE_PROVIDER_SITE_OTHER): Payer: Commercial Managed Care - HMO | Admitting: *Deleted

## 2015-05-04 ENCOUNTER — Ambulatory Visit (INDEPENDENT_AMBULATORY_CARE_PROVIDER_SITE_OTHER): Payer: Commercial Managed Care - HMO | Admitting: Cardiology

## 2015-05-04 VITALS — BP 140/70 | HR 80 | Ht 75.0 in | Wt 240.1 lb

## 2015-05-04 DIAGNOSIS — I255 Ischemic cardiomyopathy: Secondary | ICD-10-CM | POA: Diagnosis not present

## 2015-05-04 DIAGNOSIS — E785 Hyperlipidemia, unspecified: Secondary | ICD-10-CM | POA: Diagnosis not present

## 2015-05-04 DIAGNOSIS — I2583 Coronary atherosclerosis due to lipid rich plaque: Principal | ICD-10-CM

## 2015-05-04 DIAGNOSIS — G4733 Obstructive sleep apnea (adult) (pediatric): Secondary | ICD-10-CM | POA: Diagnosis not present

## 2015-05-04 DIAGNOSIS — I509 Heart failure, unspecified: Secondary | ICD-10-CM | POA: Diagnosis not present

## 2015-05-04 DIAGNOSIS — I2510 Atherosclerotic heart disease of native coronary artery without angina pectoris: Secondary | ICD-10-CM

## 2015-05-04 DIAGNOSIS — I5022 Chronic systolic (congestive) heart failure: Secondary | ICD-10-CM | POA: Diagnosis not present

## 2015-05-04 LAB — BASIC METABOLIC PANEL
BUN: 16 mg/dL (ref 7–25)
CALCIUM: 9.2 mg/dL (ref 8.6–10.3)
CHLORIDE: 101 mmol/L (ref 98–110)
CO2: 26 mmol/L (ref 20–31)
CREATININE: 0.95 mg/dL (ref 0.70–1.33)
Glucose, Bld: 102 mg/dL — ABNORMAL HIGH (ref 65–99)
Potassium: 4.3 mmol/L (ref 3.5–5.3)
Sodium: 135 mmol/L (ref 135–146)

## 2015-05-04 NOTE — Patient Instructions (Signed)

## 2015-05-04 NOTE — Addendum Note (Signed)
Addended by: Eulis Foster on: 05/04/2015 08:11 AM   Modules accepted: Orders

## 2015-05-04 NOTE — Progress Notes (Signed)
Cardiology Office Note    Date:  05/04/2015   ID:  Joe [name], DOB 1955/10/10, MRN [id number]  PCP:  Abigail Miyamoto, MD  Cardiologist:  Sueanne Margarita, MD   Chief Complaint  Patient presents with  . Coronary Artery Disease  . Hypertension  . Sleep Apnea    History of Present Illness:  Joe [name] is a 60 y.o. male with a history of ASCAD, ischemic DCM with AICD, chronic systolic CHF class I, dyslipidemia, obesity and OSA on CPAP who presents today for followup. He is doing well. He denies any chest pain, SOB, DOE, LE edema, dizziness, palpitations or syncope. He tolerates his CPAP well. He tolerates the full face mask and feels the pressure is adequate. He occasionally has some nasal congestion due to allergies. He has no daytime sleepiness and feels refreshed when he gets up. He is not working out right now due to back pain.     Past Medical History  Diagnosis Date  . Ischemic cardiomyopathy     A. 01/09/2011 - s/p St. Jude Fortify ST VR [place] AICD  . VF (ventricular fibrillation) ([place])     arrest in setting of AMI 6/11  . Hyperlipidemia   . OSA (obstructive sleep apnea)     severe with AHI 26/hr now on CPAP at [place]  . Coronary artery disease     s/p PCI mild LAD (BMS) in setting of AWMI complicated by Vfib arrest with 40-50% residual disesae in left circ  . Chronic systolic CHF (congestive heart failure), NYHA class 1 ([facility])     EF 37% by nuclear stress test    Past Surgical History  Procedure Laterality Date  . Back surgery    . Back surgery    . Knee arthroscopy      right  . Pacemaker insertion  2012    St Jude  . Implantable cardioverter defibrillator implant N/A 01/09/2011    Procedure: IMPLANTABLE CARDIOVERTER DEFIBRILLATOR IMPLANT;  Surgeon: Evans Lance, MD;  Location: [facility] CATH LAB;  Service: Cardiovascular;  Laterality: N/A;    Current Medications: Outpatient Prescriptions Prior to Visit  Medication Sig Dispense Refill  . aspirin 81 MG  tablet Take 81 mg by mouth daily.      . carvedilol (COREG) 6.25 MG tablet Take 1 tablet (6.25 mg total) by mouth 2 (two) times daily with a meal. 180 tablet 2  . co-enzyme Q-10 30 MG capsule Take 5 capsules (150 mg total) by mouth daily.    Marland Kitchen ezetimibe (ZETIA) 10 MG tablet Take 1 tablet (10 mg total) by mouth daily. 90 tablet 1  . fenofibrate 160 MG tablet Take 1 tablet (160 mg total) by mouth daily. 30 tablet 11  . fish oil-omega-3 fatty acids 1000 MG capsule Take 4 g by mouth daily.     . meloxicam (MOBIC) 15 MG tablet Take 15 mg by mouth daily.    . nitroGLYCERIN (NITROSTAT) 0.4 MG SL tablet Place 0.4 mg under the tongue every 5 (five) minutes as needed for chest pain. Up to 3 doses    . simvastatin (ZOCOR) 10 MG tablet Take 1 tablet (10 mg total) by mouth at bedtime. 30 tablet 11  . valsartan (DIOVAN) 80 MG tablet TAKE 1 TABLET BY MOUTH EVERY DAY AS DIRECTED 30 tablet 7   No facility-administered medications prior to visit.     Allergies:   Imdur; Crestor; and Lipitor   Social History   Social History  . Marital Status:  Single    Spouse Name: N/A  . Number of Children: N/A  . Years of Education: N/A   Social History Main Topics  . Smoking status: Former Smoker -- 1.00 packs/day for 34 years    Types: Cigarettes    Quit date: 06/13/2009  . Smokeless tobacco: Never Used     Comment: Socially  . Alcohol Use: 0.0 oz/week     Comment: occasional  . Drug Use: No  . Sexual Activity: Yes   Other Topics Concern  . None   Social History Narrative     Family History:  The patient's family history includes Aneurysm in his mother.   ROS:   Please see the history of present illness.    Review of Systems  Constitution: Negative.  HENT: Negative.   Eyes: Negative.   Cardiovascular: Negative.   Respiratory: Negative.   Skin: Negative.   Musculoskeletal: Positive for back pain and joint pain.  Gastrointestinal: Negative.   Genitourinary: Negative.   Neurological: Negative.     Psychiatric/Behavioral: Negative.    All other systems reviewed and are negative.   PHYSICAL EXAM:   VS:  BP 140/70 mmHg  Pulse 80  Ht 6\' 3"  (1.905 m)  Wt 240 lb 1.9 oz (108.918 kg)  BMI 30.01 kg/m2   GEN: Well nourished, well developed, in no acute distress HEENT: normal Neck: no JVD, carotid bruits, or masses Cardiac: RRR; no murmurs, rubs, or gallops,no edema.  Intact distal pulses bilaterally.  Respiratory:  clear to auscultation bilaterally, normal work of breathing GI: soft, nontender, nondistended, + BS MS: no deformity or atrophy Skin: warm and dry, no rash Neuro:  Alert and Oriented x 3, Strength and sensation are intact Psych: euthymic mood, full affect  Wt Readings from Last 3 Encounters:  05/04/15 240 lb 1.9 oz (108.918 kg)  09/12/14 244 lb (110.678 kg)  02/09/14 246 lb 9.6 oz (111.857 kg)      Studies/Labs Reviewed:   EKG:  EKG is not ordered today.   Recent Labs: 02/15/2015: ALT 35   Lipid Panel    Component Value Date/Time   CHOL 173 02/15/2015 0749   CHOL 243* 03/15/2013 0831   TRIG 133 02/15/2015 0749   TRIG 245* 03/15/2013 0831   HDL 42 02/15/2015 0749   HDL 53 03/15/2013 0831   CHOLHDL 4.1 02/15/2015 0749   VLDL 27 02/15/2015 0749   LDLCALC 104 02/15/2015 0749   LDLCALC 141* 03/15/2013 0831   LDLDIRECT 98.0 06/28/2014 0751    Additional studies/ records that were reviewed today include:  none    ASSESSMENT:    1. Coronary artery disease due to lipid rich plaque   2. Ischemic cardiomyopathy   3. Chronic systolic CHF (congestive heart failure), NYHA class 1 ([place])   4. OSA (obstructive sleep apnea)   5. Dyslipidemia      PLAN:  In order of problems listed above:  1. ASCAD s/p PCI with BMS of LAD with residual LCx disease with no angina.  Continue ASA/statin 2. Ischemic DCM last EF 37% by nuclear stress test.  I will repeat echo since this has not been done recently to reassess LVF 3. Chronic systolic CHF - he appears euvolemic on  exam.  Continue BB and ARB.  Check BMET today. 4. OSA - tolerating his CPAP well.  He will continue on current CPAP settings. 5. Dyslipidemia - LDL goal is < 70.  He was switched from simvastatin to Crestor but he could not tolerate it due to  muscle aches.  He will continue simvastatin/fish oil/Zetia and fibrate.  I will refer him to lipid clinic for ? PCSK 9 drug.    Medication Adjustments/Labs and Tests Ordered: Current medicines are reviewed at length with the patient today.  Concerns regarding medicines are outlined above.  Medication changes, Labs and Tests ordered today are listed in the Patient Instructions below. There are no Patient Instructions on file for this visit.   Lurena Nida, MD  05/04/2015 7:56 AM    [facility] [place], [place], [place]  [address] Phone: [phone]; Fax: [phone]

## 2015-05-07 ENCOUNTER — Other Ambulatory Visit: Payer: Self-pay

## 2015-05-07 DIAGNOSIS — I255 Ischemic cardiomyopathy: Secondary | ICD-10-CM

## 2015-05-07 DIAGNOSIS — I5022 Chronic systolic (congestive) heart failure: Secondary | ICD-10-CM

## 2015-05-08 ENCOUNTER — Other Ambulatory Visit: Payer: Self-pay

## 2015-05-08 ENCOUNTER — Ambulatory Visit (INDEPENDENT_AMBULATORY_CARE_PROVIDER_SITE_OTHER): Payer: Commercial Managed Care - HMO | Admitting: Pharmacist

## 2015-05-08 ENCOUNTER — Ambulatory Visit (HOSPITAL_COMMUNITY): Payer: Commercial Managed Care - HMO | Attending: Cardiology

## 2015-05-08 DIAGNOSIS — I77810 Thoracic aortic ectasia: Secondary | ICD-10-CM | POA: Insufficient documentation

## 2015-05-08 DIAGNOSIS — I255 Ischemic cardiomyopathy: Secondary | ICD-10-CM

## 2015-05-08 DIAGNOSIS — I5022 Chronic systolic (congestive) heart failure: Secondary | ICD-10-CM | POA: Insufficient documentation

## 2015-05-08 DIAGNOSIS — R29898 Other symptoms and signs involving the musculoskeletal system: Secondary | ICD-10-CM | POA: Diagnosis not present

## 2015-05-08 DIAGNOSIS — E785 Hyperlipidemia, unspecified: Secondary | ICD-10-CM

## 2015-05-08 DIAGNOSIS — I110 Hypertensive heart disease with heart failure: Secondary | ICD-10-CM | POA: Diagnosis not present

## 2015-05-08 DIAGNOSIS — I340 Nonrheumatic mitral (valve) insufficiency: Secondary | ICD-10-CM | POA: Insufficient documentation

## 2015-05-08 MED ORDER — SIMVASTATIN 20 MG PO TABS: 20.0000 mg | ORAL_TABLET | Freq: Every day | ORAL | Status: DC

## 2015-05-08 NOTE — Patient Instructions (Addendum)
Thank you for coming in today  Please increase Simvastatin (Zocor) to 20 mg daily at bedtime.  You can take 2 of your 10 mg tablets once daily at bedtime until your run out then switch to the 20 mg daily at bedtime  If you do have any increased muscle pain or joint pain please call us at [phone]  Will plan to have a fasting lipid panel in 2 months.

## 2015-05-08 NOTE — Progress Notes (Signed)
Patient ID: Joe [name]       DOB:23-May-1955                [id number]     HPI: Joe [name] is a 60 y.o. male patient referred to lipid clinic by Dr Radford Pax who presents today for follow up. He has a PMH of ASCAD, ischemic DCM with AICD, chronic systolic CHF class I, dyslipidemia, obesity and OSA on CPAP  He states he has tried atorvastatin and rosuvastatin in the past but had muscle and joint pain which resolved upon discontinuation after ~ 1 week. He is able to tolerate the simvastatin 10 mg daily without any muscle or joint pain.  He states that he has previously discussed the PCSK9 inhibitor trials with the lipid clinic and would be open to enrolling in future trials.   Current Medications: simvastatin 10 mg QHS, fish oil 4g daily, ezetimibe 10 mg daily, fenofibrate 160 mg daily Intolerances: rosuvastatin 5 mg 3x/week, 5 mg daily, 20 mg daily (muscle and joint pain), atorvastatin unknown dose (muscle and joint pain) Risk Factors: ASCAD LDL goal: <70 mg/dL  Diet:  Trying to avoid red meats and fried foods.  But does still eat both of these a couple times a week  Increasing fruits and vegetables.   Exercise: Walking a lot throughout the day when it is nice outside.  Is limited due to back pain.  Family History: family history includes Aneurysm in his mother.  Social History:  reports that he quit smoking about 5 years ago. His smoking use included Cigarettes. He has a 34 pack-year smoking history. He has never used smokeless tobacco. He reports that he drinks alcohol. He reports that he does not use illicit drugs.  Labs: 02/15/15: TC 173, TG 133, HDL 42, LDL 104 (On simvastatin 10 mg daily, fish oil 4g daily, ezetimibe 10 mg daily, fenofibrate 160 mg daily)  Past Medical History  Diagnosis Date  . Ischemic cardiomyopathy     A. 01/09/2011 - s/p St. Jude Fortify ST VR [place] AICD  . VF (ventricular fibrillation) ([place])     arrest in setting of AMI 6/11  . Hyperlipidemia     . OSA (obstructive sleep apnea)     severe with AHI 26/hr now on CPAP at [place]  . Coronary artery disease     s/p PCI mild LAD (BMS) in setting of AWMI complicated by Vfib arrest with 40-50% residual disesae in left circ  . Chronic systolic CHF (congestive heart failure), NYHA class 1 (HCC)     EF 37% by nuclear stress test    Current Outpatient Prescriptions on File Prior to Visit  Medication Sig Dispense Refill  . aspirin 81 MG tablet Take 81 mg by mouth daily.      . carvedilol (COREG) 6.25 MG tablet Take 1 tablet (6.25 mg total) by mouth 2 (two) times daily with a meal. 180 tablet 2  . co-enzyme Q-10 30 MG capsule Take 5 capsules (150 mg total) by mouth daily.    Marland Kitchen ezetimibe (ZETIA) 10 MG tablet Take 1 tablet (10 mg total) by mouth daily. 90 tablet 1  . fenofibrate 160 MG tablet Take 1 tablet (160 mg total) by mouth daily. 30 tablet 11  . fish oil-omega-3 fatty acids 1000 MG capsule Take 4 g by mouth daily.     . meloxicam (MOBIC) 15 MG tablet Take 15 mg by mouth daily.    . nitroGLYCERIN (NITROSTAT) 0.4 MG SL tablet Place 0.4 mg  under the tongue every 5 (five) minutes as needed for chest pain. Up to 3 doses    . simvastatin (ZOCOR) 10 MG tablet Take 1 tablet (10 mg total) by mouth at bedtime. 30 tablet 11  . valsartan (DIOVAN) 80 MG tablet TAKE 1 TABLET BY MOUTH EVERY DAY AS DIRECTED 30 tablet 7   No current facility-administered medications on file prior to visit.    Allergies  Allergen Reactions  . Imdur [Isosorbide Dinitrate] Other (See Comments)    headache headache  . Crestor [Rosuvastatin]     Muscle aches on 5 mg qd and 20 mg qd  . Lipitor [Atorvastatin]     Made his irritable and anxious    Assessment/Plan:  1. Hyperlipidemia: Currently above LDL goal of <70 mg/dL at 103 mg/dL on simvastatin 10 mg daily, fish oil 4g daily, ezetimibe 10 mg daily, fenofibrate 160 mg daily. Patient has a history of joint/muscle pain on rosuvastatin 5 mg 3x/week, 5 mg daily, 20 mg  daily and an unknown dose of atorvastatin. Will increase simvastatin to 20 mg QHS.  Instructed pt to call clinic if he experiences joint/muscle pain.  Will followup fasting lipid panel and LFTs in 2 months.  Will pass along information to research foundation at [facility] for upcoming clinical trials if patient is unable to tolerate statin increase or LDL remains above goal.

## 2015-05-10 ENCOUNTER — Telehealth: Payer: Self-pay

## 2015-05-10 DIAGNOSIS — I5022 Chronic systolic (congestive) heart failure: Secondary | ICD-10-CM

## 2015-05-10 DIAGNOSIS — I77810 Thoracic aortic ectasia: Secondary | ICD-10-CM

## 2015-05-10 NOTE — Telephone Encounter (Signed)
Informed patient of results and verbal understanding expressed.  Repeat ECHO ordered to be scheduled in 1 year. Patient agrees with treatment plan. 

## 2015-05-10 NOTE — Telephone Encounter (Signed)
-----   Message from Sueanne Margarita, MD sent at 05/08/2015  5:52 PM EDT ----- Moderately reduced LVF with EF 30-35% with multiple wall motion abonormalities and increased stiffness of heart muscle.  Mildly dilated aortic root.  Repeat echo in 1 year to followup on dilated aortic root.

## 2015-06-13 ENCOUNTER — Ambulatory Visit (INDEPENDENT_AMBULATORY_CARE_PROVIDER_SITE_OTHER): Payer: Commercial Managed Care - HMO | Admitting: *Deleted

## 2015-06-13 DIAGNOSIS — I255 Ischemic cardiomyopathy: Secondary | ICD-10-CM | POA: Diagnosis not present

## 2015-06-13 NOTE — Progress Notes (Signed)
Remote ICD transmission.   

## 2015-06-26 ENCOUNTER — Telehealth: Payer: Self-pay

## 2015-06-26 NOTE — Telephone Encounter (Signed)
Call Joe [name] to check on his zetia, he states that he did get it and it was sent to his home. I spoke with him to let him know to call us when he needed a refill so we can call the company for his next order.

## 2015-06-28 LAB — CUP PACEART INCLINIC DEVICE CHECK
Date Time Interrogation Session: 20170615161644
Implantable Lead Implant Date: 20121227
Lead Channel Pacing Threshold Amplitude: 0.75 V
Lead Channel Pacing Threshold Pulse Width: 0.5 ms
MDC IDC LEAD LOCATION: 753860
MDC IDC LEAD MODEL: 7122
MDC IDC MSMT LEADCHNL RV IMPEDANCE VALUE: 490 Ohm
MDC IDC MSMT LEADCHNL RV SENSING INTR AMPL: 11.8 mV
MDC IDC PG SERIAL: 1037513

## 2015-07-04 ENCOUNTER — Encounter: Payer: Self-pay | Admitting: Cardiology

## 2015-07-10 ENCOUNTER — Other Ambulatory Visit: Payer: Commercial Managed Care - HMO

## 2015-07-11 ENCOUNTER — Other Ambulatory Visit (INDEPENDENT_AMBULATORY_CARE_PROVIDER_SITE_OTHER): Payer: Commercial Managed Care - HMO | Admitting: *Deleted

## 2015-07-11 DIAGNOSIS — E785 Hyperlipidemia, unspecified: Secondary | ICD-10-CM | POA: Diagnosis not present

## 2015-07-11 LAB — LIPID PANEL
CHOL/HDL RATIO: 2.7 ratio (ref <=5.0)
CHOLESTEROL: 150 mg/dL (ref 125–200)
HDL: 56 mg/dL (ref >=40)
LDL Cholesterol: 65 mg/dL (ref <130)
Triglycerides: 146 mg/dL (ref <150)
VLDL: 29 mg/dL (ref <30)

## 2015-07-11 LAB — HEPATIC FUNCTION PANEL
ALT: 24 U/L (ref 9–46)
AST: 20 U/L (ref 10–35)
Albumin: 4.4 g/dL (ref 3.6–5.1)
Alkaline Phosphatase: 42 U/L (ref 40–115)
BILIRUBIN DIRECT: 0.1 mg/dL (ref <=0.2)
BILIRUBIN INDIRECT: 0.4 mg/dL (ref 0.2–1.2)
Total Bilirubin: 0.5 mg/dL (ref 0.2–1.2)
Total Protein: 6.2 g/dL (ref 6.1–8.1)

## 2015-07-11 NOTE — Addendum Note (Signed)
Addended by: Eulis Foster on: 07/11/2015 07:48 AM   Modules accepted: Orders

## 2015-07-30 ENCOUNTER — Telehealth: Payer: Self-pay | Admitting: Pharmacist

## 2015-07-30 NOTE — Telephone Encounter (Signed)
Pt called to report that he has been having some muscle aches. At last lipid visit, increased simvastatin 10mg  to 20mg . Pt reports he was taking 2 of his 10mg  tabs until recently when he picked up new 20mg  tabs. He also ran out of CoQ10 last week and reports that his symptoms started around then.   Lipid panel checked 2 weeks ago and lipids were excellent. Timing seems to match up more with pt stopping CoQ10. Advised him to restart CoQ10 at 200mg  daily (currently taking 100mg  daily) and to hold his simvastatin for a few days until symptoms resolve, then restart simvastatin 10mg  for a few days and titrate back up to 20mg  daily.   Pt verbalized understanding of plan. Also mailed out a copy of his lipid panel results to him.

## 2015-08-02 ENCOUNTER — Telehealth: Payer: Self-pay

## 2015-08-02 NOTE — Telephone Encounter (Signed)
MR Joe [name] CALLED ME ABOUT GETTING A REFILL ON HIS ZETIA FROM [facility]. I CALLED [facility] AND REQUESTED HIS ZETIA. PT SHOULD GET MED WITH IN 5-7 DAYS. CALLED PT TO LET HIM KNOW THIS.RX # [id number]

## 2015-09-13 ENCOUNTER — Other Ambulatory Visit: Payer: Self-pay | Admitting: Internal Medicine

## 2015-09-13 ENCOUNTER — Encounter: Payer: Self-pay | Admitting: Internal Medicine

## 2015-09-13 ENCOUNTER — Ambulatory Visit (INDEPENDENT_AMBULATORY_CARE_PROVIDER_SITE_OTHER): Payer: Commercial Managed Care - HMO | Admitting: Internal Medicine

## 2015-09-13 VITALS — BP 142/90 | HR 82 | Ht 74.5 in | Wt 234.4 lb

## 2015-09-13 DIAGNOSIS — Z4502 Encounter for adjustment and management of automatic implantable cardiac defibrillator: Secondary | ICD-10-CM | POA: Diagnosis not present

## 2015-09-13 DIAGNOSIS — I255 Ischemic cardiomyopathy: Secondary | ICD-10-CM

## 2015-09-13 DIAGNOSIS — I5022 Chronic systolic (congestive) heart failure: Secondary | ICD-10-CM | POA: Diagnosis not present

## 2015-09-13 MED ORDER — NITROGLYCERIN 0.4 MG SL SUBL: 0.4000 mg | SUBLINGUAL_TABLET | SUBLINGUAL | 3 refills | Status: DC | PRN

## 2015-09-13 NOTE — Patient Instructions (Signed)
Medication Instructions:  Your physician recommends that you continue on your current medications as directed. Please refer to the Current Medication list given to you today.   Labwork: None ordered   Testing/Procedures: None ordered   Follow-Up: Your physician wants you to follow-up in: 12 months with Dr Knox Saliva will receive a reminder letter in the mail two months in advance. If you don't receive a letter, please call our office to schedule the follow-up appointment.   Remote monitoring is used to monitor your  ICD from home. This monitoring reduces the number of office visits required to check your device to one time per year. It allows [place] to keep an eye on the functioning of your device to ensure it is working properly. You are scheduled for a device check from home on 12/13/15. You may send your transmission at any time that day. If you have a wireless device, the transmission will be sent automatically. After your physician reviews your transmission, you will receive a postcard with your next transmission date.      Thank you for choosing [facility]!!     Janan Halter, RN [phone]

## 2015-09-13 NOTE — Progress Notes (Signed)
HPI Joe [name] returns today for followup. He is a pleasant middle aged man with an ICM, chronic systolic heart failure, HTN, and s/p ICD implant. After his initial implant almost 5 years ago, he had severe chest pressure at the ICD insertion site. He has had improvement in his pain since then, though it has not worsened. No fever or chills.  In the interim, he has increased his activity.  No syncope or ICD shocks. Allergies  Allergen Reactions  . Imdur [Isosorbide Dinitrate] Other (See Comments)    headache   . Crestor [Rosuvastatin]     Muscle aches on 5 mg qd and 20 mg qd  . Lipitor [Atorvastatin]     Made his irritable and anxious     Current Outpatient Prescriptions  Medication Sig Dispense Refill  . aspirin 81 MG tablet Take 81 mg by mouth daily.      . carvedilol (COREG) 6.25 MG tablet Take 1 tablet (6.25 mg total) by mouth 2 (two) times daily with a meal. 180 tablet 2  . co-enzyme Q-10 30 MG capsule Take 5 capsules (150 mg total) by mouth daily.    Marland Kitchen ezetimibe (ZETIA) 10 MG tablet Take 1 tablet (10 mg total) by mouth daily. 90 tablet 1  . fenofibrate 160 MG tablet Take 1 tablet (160 mg total) by mouth daily. 30 tablet 11  . fish oil-omega-3 fatty acids 1000 MG capsule Take 4 g by mouth daily.     . nitroGLYCERIN (NITROSTAT) 0.4 MG SL tablet Place 0.4 mg under the tongue every 5 (five) minutes as needed for chest pain. Up to 3 doses    . simvastatin (ZOCOR) 20 MG tablet Take 1 tablet (20 mg total) by mouth at bedtime. 90 tablet 3  . valsartan (DIOVAN) 80 MG tablet TAKE 1 TABLET BY MOUTH EVERY DAY AS DIRECTED 30 tablet 7   No current facility-administered medications for this visit.      Past Medical History:  Diagnosis Date  . Chronic systolic CHF (congestive heart failure), NYHA class 1 (HCC)    EF 37% by nuclear stress test  . Coronary artery disease    s/p PCI mild LAD (BMS) in setting of AWMI complicated by Vfib arrest with 40-50% residual disesae in left circ  .  Hyperlipidemia   . Ischemic cardiomyopathy    A. 01/09/2011 - s/p St. Jude Fortify ST VR [place] AICD  . OSA (obstructive sleep apnea)    severe with AHI 26/hr now on CPAP at [facility]  . VF (ventricular fibrillation) ([place] [name])    arrest in setting of AMI 6/11    ROS:   All systems reviewed and negative except as noted in the HPI.   Past Surgical History:  Procedure Laterality Date  . BACK SURGERY    . BACK SURGERY    . IMPLANTABLE CARDIOVERTER DEFIBRILLATOR IMPLANT N/A 01/09/2011   Procedure: IMPLANTABLE CARDIOVERTER DEFIBRILLATOR IMPLANT;  Surgeon: Joe Lance, MD;  Location: [facility] CATH LAB;  Service: Cardiovascular;  Laterality: N/A;  . KNEE ARTHROSCOPY     right  . PACEMAKER INSERTION  2012   St Jude     Family History  Problem Relation Age of Onset  . Aneurysm Mother      Social History   Social History  . Marital status: Single    Spouse name: N/A  . Number of children: N/A  . Years of education: N/A   Occupational History  . Not on file.   Social History Main Topics  .  Smoking status: Former Smoker    Packs/day: 1.00    Years: 34.00    Types: Cigarettes    Quit date: 06/13/2009  . Smokeless tobacco: Never Used     Comment: Socially  . Alcohol use 0.0 oz/week     Comment: occasional  . Drug use: No  . Sexual activity: Yes   Other Topics Concern  . Not on file   Social History Narrative  . No narrative on file     BP (!) 142/90   Pulse 82   Ht 6' 2.5" (1.892 m)   Wt 234 lb 6.4 oz (106.3 kg)   BMI 29.69 kg/m   Physical Exam:  Well appearing middle aged man, NAD HEENT: Unremarkable Neck:  6 cm JVD, no thyromegally Lungs:  Clear with no wheezes, rales, or rhonchi, minimal tenderness at ICD insertion site. HEART:  Regular rate rhythm, no murmurs, no rubs, no clicks Abd:  soft, positive bowel sounds, no organomegally, no rebound, no guarding Ext:  2 plus pulses, no edema, no cyanosis, no clubbing Skin:  No rashes no nodules Neuro:  CN II through  XII intact, motor grossly intact  DEVICE  Normal device function.  See PaceArt for details.   Assess/Plan:  1. ICD - his St. Jude device is working normally. Will recheck in several months 2. CAD - he remains active and has no anginal symptoms. Will follow. 3. Chronic systolic heart failure - his symptoms are class 2A. He will continue his current meds. I have encouraged the patient to maintain a low sodium diet.   Mikle Bosworth.D.

## 2015-09-20 LAB — CUP PACEART INCLINIC DEVICE CHECK
Date Time Interrogation Session: 20170831163305
HIGH POWER IMPEDANCE MEASURED VALUE: 76.5 Ohm
Lead Channel Impedance Value: 462.5 Ohm
Lead Channel Pacing Threshold Amplitude: 1 V
Lead Channel Sensing Intrinsic Amplitude: 12 mV
Lead Channel Setting Pacing Amplitude: 2.5 V
Lead Channel Setting Pacing Pulse Width: 0.5 ms
Lead Channel Setting Sensing Sensitivity: 0.5 mV
MDC IDC LEAD IMPLANT DT: 20121227
MDC IDC LEAD LOCATION: 753860
MDC IDC LEAD MODEL: 7122
MDC IDC MSMT BATTERY REMAINING LONGEVITY: 68.4
MDC IDC MSMT LEADCHNL RV PACING THRESHOLD PULSEWIDTH: 0.5 ms
MDC IDC STAT BRADY RV PERCENT PACED: 0 %
Pulse Gen Serial Number: 1037513

## 2015-10-10 DIAGNOSIS — R229 Localized swelling, mass and lump, unspecified: Secondary | ICD-10-CM | POA: Diagnosis not present

## 2015-10-10 DIAGNOSIS — Z23 Encounter for immunization: Secondary | ICD-10-CM | POA: Diagnosis not present

## 2015-11-05 ENCOUNTER — Telehealth: Payer: Self-pay

## 2015-11-05 NOTE — Telephone Encounter (Signed)
PT CALLED ME FOR A REFILL ON HIS ZETIA FROM THE [facility]  I CALLED AND ORDER HIS [place].

## 2015-11-22 ENCOUNTER — Telehealth: Payer: Self-pay | Admitting: Cardiology

## 2015-11-22 NOTE — Telephone Encounter (Signed)
LMOVM requesting that pt send manual transmission b/c home monitor has not updated in at least 7 days.    

## 2015-11-24 ENCOUNTER — Other Ambulatory Visit: Payer: Self-pay | Admitting: Cardiology

## 2015-12-05 ENCOUNTER — Telehealth: Payer: Self-pay | Admitting: Cardiology

## 2015-12-05 NOTE — Telephone Encounter (Signed)
Spoke w/ pt and requested that he send a manual transmission b/c his home monitor has not updated in at least 7 days.   

## 2015-12-13 ENCOUNTER — Telehealth: Payer: Self-pay | Admitting: Cardiology

## 2015-12-13 ENCOUNTER — Ambulatory Visit (INDEPENDENT_AMBULATORY_CARE_PROVIDER_SITE_OTHER): Payer: Commercial Managed Care - HMO | Admitting: *Deleted

## 2015-12-13 DIAGNOSIS — I255 Ischemic cardiomyopathy: Secondary | ICD-10-CM | POA: Diagnosis not present

## 2015-12-13 NOTE — Telephone Encounter (Signed)
Spoke with pt and reminded pt of remote transmission that is due today. Pt verbalized understanding.   

## 2015-12-14 NOTE — Progress Notes (Signed)
Remote ICD transmission.   

## 2015-12-24 ENCOUNTER — Other Ambulatory Visit: Payer: Self-pay | Admitting: Cardiology

## 2015-12-25 ENCOUNTER — Encounter: Payer: Self-pay | Admitting: Cardiology

## 2016-01-23 LAB — CUP PACEART REMOTE DEVICE CHECK
HIGH POWER IMPEDANCE MEASURED VALUE: 64 Ohm
Implantable Lead Location: 753860
Implantable Pulse Generator Implant Date: 20121227
Lead Channel Impedance Value: 450 Ohm
Lead Channel Setting Pacing Amplitude: 2.5 V
Lead Channel Setting Sensing Sensitivity: 0.5 mV
MDC IDC LEAD IMPLANT DT: 20121227
MDC IDC LEAD MODEL: 7122
MDC IDC MSMT BATTERY REMAINING LONGEVITY: 65 mo
MDC IDC SESS DTM: 20180110113334
MDC IDC SET LEADCHNL RV PACING PULSEWIDTH: 0.5 ms
MDC IDC STAT BRADY RV PERCENT PACED: 1 % — AB
Pulse Gen Serial Number: 1037513

## 2016-02-12 ENCOUNTER — Other Ambulatory Visit: Payer: Self-pay | Admitting: Cardiology

## 2016-02-12 ENCOUNTER — Telehealth: Payer: Self-pay

## 2016-02-12 NOTE — Telephone Encounter (Signed)
Patient called to get refill on his zetia, I called the [facility] to get a refill on his zetia.

## 2016-02-14 ENCOUNTER — Encounter (INDEPENDENT_AMBULATORY_CARE_PROVIDER_SITE_OTHER): Payer: Self-pay

## 2016-02-14 ENCOUNTER — Ambulatory Visit (INDEPENDENT_AMBULATORY_CARE_PROVIDER_SITE_OTHER): Payer: Medicare HMO | Admitting: Physician Assistant

## 2016-02-14 ENCOUNTER — Encounter: Payer: Self-pay | Admitting: Physician Assistant

## 2016-02-14 VITALS — BP 166/100 | HR 62 | Ht 74.0 in | Wt 244.0 lb

## 2016-02-14 DIAGNOSIS — I255 Ischemic cardiomyopathy: Secondary | ICD-10-CM | POA: Diagnosis not present

## 2016-02-14 DIAGNOSIS — I5022 Chronic systolic (congestive) heart failure: Secondary | ICD-10-CM | POA: Diagnosis not present

## 2016-02-14 DIAGNOSIS — I10 Essential (primary) hypertension: Secondary | ICD-10-CM | POA: Diagnosis not present

## 2016-02-14 DIAGNOSIS — Z4502 Encounter for adjustment and management of automatic implantable cardiac defibrillator: Secondary | ICD-10-CM

## 2016-02-14 DIAGNOSIS — E785 Hyperlipidemia, unspecified: Secondary | ICD-10-CM | POA: Diagnosis not present

## 2016-02-14 DIAGNOSIS — Z720 Tobacco use: Secondary | ICD-10-CM

## 2016-02-14 LAB — CBC
HEMATOCRIT: 46.6 % (ref 37.5–51.0)
Hemoglobin: 16.2 g/dL (ref 13.0–17.7)
MCH: 31.6 pg (ref 26.6–33.0)
MCHC: 34.8 g/dL (ref 31.5–35.7)
MCV: 91 fL (ref 79–97)
Platelets: 250 10*3/uL (ref 150–379)
RBC: 5.13 x10E6/uL (ref 4.14–5.80)
RDW: 12.8 % (ref 12.3–15.4)
WBC: 9 10*3/uL (ref 3.4–10.8)

## 2016-02-14 LAB — COMPREHENSIVE METABOLIC PANEL
A/G RATIO: 2.1 (ref 1.2–2.2)
ALT: 23 IU/L (ref 0–44)
AST: 21 IU/L (ref 0–40)
Albumin: 4.4 g/dL (ref 3.5–5.5)
Alkaline Phosphatase: 47 IU/L (ref 39–117)
BUN/Creatinine Ratio: 16 (ref 9–20)
BUN: 14 mg/dL (ref 6–24)
Bilirubin Total: 0.5 mg/dL (ref 0.0–1.2)
CALCIUM: 9.4 mg/dL (ref 8.7–10.2)
CHLORIDE: 103 mmol/L (ref 96–106)
CO2: 22 mmol/L (ref 18–29)
Creatinine, Ser: 0.86 mg/dL (ref 0.76–1.27)
GFR calc Af Amer: 110 mL/min/{1.73_m2} (ref >59)
GFR calc non Af Amer: 96 mL/min/{1.73_m2} (ref >59)
Globulin, Total: 2.1 g/dL (ref 1.5–4.5)
Glucose: 101 mg/dL — ABNORMAL HIGH (ref 65–99)
POTASSIUM: 4.5 mmol/L (ref 3.5–5.2)
Sodium: 141 mmol/L (ref 134–144)
Total Protein: 6.5 g/dL (ref 6.0–8.5)

## 2016-02-14 LAB — LIPID PANEL
Chol/HDL Ratio: 3.5 ratio units (ref 0.0–5.0)
Cholesterol, Total: 190 mg/dL (ref 100–199)
HDL: 55 mg/dL (ref >39)
LDL Calculated: 109 mg/dL — ABNORMAL HIGH (ref 0–99)
Triglycerides: 128 mg/dL (ref 0–149)
VLDL Cholesterol Cal: 26 mg/dL (ref 5–40)

## 2016-02-14 MED ORDER — SIMVASTATIN 10 MG PO TABS: 10.0000 mg | ORAL_TABLET | Freq: Every day | ORAL | 3 refills | Status: DC

## 2016-02-14 MED ORDER — CARVEDILOL 12.5 MG PO TABS: ORAL_TABLET | ORAL | 3 refills | Status: DC

## 2016-02-14 MED ORDER — NICOTINE 7 MG/24HR TD PT24: 7.0000 mg | MEDICATED_PATCH | Freq: Every day | TRANSDERMAL | 0 refills | Status: DC

## 2016-02-14 MED ORDER — NICOTINE 14 MG/24HR TD PT24: 14.0000 mg | MEDICATED_PATCH | Freq: Every day | TRANSDERMAL | 0 refills | Status: DC

## 2016-02-14 NOTE — Progress Notes (Signed)
Cardiology Office Note    Date:  02/14/2016   ID:  Joe [name], DOB 1955-07-12, MRN [id number]  PCP:  Abigail Miyamoto, MD  Cardiologist: Dr. Radford Pax EPS Dr. Lovena Le  Chief Complaint  Patient presents with  . Follow-up    Seen for Dr. Radford Pax    History of Present Illness:  Joe [name] is a 61 y.o. male with history of CAD status post anterior wall MI treated with BMS to the LAD complicated by V. fib arrest with residual 40-50% circumflex 06/2009 ischemic cardiomyopathy EF 30-35 % on echo [id number], chronic systolic heart failure, hypertension and status post ICD implant, HLD, obesity, and OSA on C Pap. He had significant chest pressure at the ICD insertion site that has improved over time. Last saw Dr. Lovena Le 08/2015 an ICD working normally.  Last saw Dr. Radford Pax 04/2015 and 2-D echo ordered showing EF of 30-35%. Also referred to the lipid clinic because his LDL was above goal on simvastatin, fish oil ezetimibe and fenofibrate. Simvastatin increased to 20 mg daily but developed muscle aches so reduced to 10 mg daily but patient stopped all together. F/U lipids excellent with LDL 65.  Patient comes in today not feeling well. He is congested and has had an upper respiratory infection. He has been taking a lot of decongestants and medications with DM and it. He also ate out [employer] wings last night and his blood pressure is high. He never checks it at home. He is also smoking a pack of cigarettes daily. Denies chest pain, palpitations, dyspnea, dyspnea on exertion, edema, dizziness or presyncope. Sometimes he has a sharp shooting pain or twinge near his ICD but that's it.    Past Medical History:  Diagnosis Date  . Chronic systolic CHF (congestive heart failure), NYHA class 1 (HCC)    EF 37% by nuclear stress test  . Coronary artery disease    s/p PCI mild LAD (BMS) in setting of AWMI complicated by Vfib arrest with 40-50% residual disesae in left circ  . Hyperlipidemia   .  Ischemic cardiomyopathy    A. 01/09/2011 - s/p St. Jude Fortify ST VR [place] AICD  . OSA (obstructive sleep apnea)    severe with AHI 26/hr now on CPAP at [facility]  . VF (ventricular fibrillation) ([place])    arrest in setting of AMI 6/11    Past Surgical History:  Procedure Laterality Date  . BACK SURGERY    . BACK SURGERY    . IMPLANTABLE CARDIOVERTER DEFIBRILLATOR IMPLANT N/A 01/09/2011   Procedure: IMPLANTABLE CARDIOVERTER DEFIBRILLATOR IMPLANT;  Surgeon: Evans Lance, MD;  Location: [facility] CATH LAB;  Service: Cardiovascular;  Laterality: N/A;  . KNEE ARTHROSCOPY     right  . PACEMAKER INSERTION  2012   St Jude    Current Medications: Outpatient Medications Prior to Visit  Medication Sig Dispense Refill  . aspirin 81 MG tablet Take 81 mg by mouth daily.      Marland Kitchen co-enzyme Q-10 30 MG capsule Take 5 capsules (150 mg total) by mouth daily.    Marland Kitchen ezetimibe (ZETIA) 10 MG tablet Take 1 tablet (10 mg total) by mouth daily. 90 tablet 1  . fenofibrate 160 MG tablet TAKE 1 TABLET BY MOUTH DAILY 30 tablet 4  . fish oil-omega-3 fatty acids 1000 MG capsule Take 4 g by mouth daily.     . nitroGLYCERIN (NITROSTAT) 0.4 MG SL tablet Place 1 tablet (0.4 mg total) under the tongue every 5 (five) minutes  as needed for chest pain. Up to 3 doses 25 tablet 3  . valsartan (DIOVAN) 80 MG tablet TAKE 1 TABLET BY MOUTH EVERY DAY AS DIRECTED 30 tablet 6  . carvedilol (COREG) 6.25 MG tablet TAKE 1 TABLET(6.25 MG) BY MOUTH TWICE DAILY WITH A MEAL 180 tablet 2  . simvastatin (ZOCOR) 20 MG tablet Take 1 tablet (20 mg total) by mouth at bedtime. 90 tablet 3   No facility-administered medications prior to visit.      Allergies:   Imdur [isosorbide dinitrate]; Crestor [rosuvastatin]; and Lipitor [atorvastatin]   Social History   Social History  . Marital status: Single    Spouse name: N/A  . Number of children: N/A  . Years of education: N/A   Social History Main Topics  . Smoking status: Former Smoker     Packs/day: 1.00    Years: 34.00    Types: Cigarettes    Quit date: 06/13/2009  . Smokeless tobacco: Never Used     Comment: Socially  . Alcohol use 0.0 oz/week     Comment: occasional  . Drug use: No  . Sexual activity: Yes   Other Topics Concern  . None   Social History Narrative  . None     Family History:  The patient's family history includes Aneurysm in his mother.   ROS:   Please see the history of present illness.    Review of Systems  Constitution: Negative.  HENT: Positive for congestion.   Cardiovascular: Negative.   Respiratory: Negative.   Endocrine: Negative.   Hematologic/Lymphatic: Negative.   Musculoskeletal: Positive for myalgias.  Gastrointestinal: Negative.   Genitourinary: Negative.   Neurological: Negative.    All other systems reviewed and are negative.   PHYSICAL EXAM:   VS:  BP (!) 166/100   Pulse 62   Ht 6\' 2"  (1.88 m)   Wt 244 lb (110.7 kg)   BMI 31.33 kg/m   Physical Exam  GEN: Well nourished, well developed, in no acute distress  Neck: no JVD, carotid bruits, or masses Cardiac:RRR;Positive S4 no murmurs, rubs Respiratory:  clear to auscultation bilaterally, normal work of breathing GI: soft, nontender, nondistended, + BS Ext: without cyanosis, clubbing, or edema, Good distal pulses bilaterally Psych: euthymic mood, full affect  Wt Readings from Last 3 Encounters:  02/14/16 244 lb (110.7 kg)  09/13/15 234 lb 6.4 oz (106.3 kg)  05/04/15 240 lb 1.9 oz (108.9 kg)      Studies/Labs Reviewed:   EKG:  EKG is  ordered today.  The ekg ordered today demonstrates Normal sinus rhythm with left anterior fascicular block, and inferior Q waves, anterior septal infarct and T-wave inversion laterally, no acute change  Recent Labs: 05/04/2015: BUN 16; Creat 0.95; Potassium 4.3; Sodium 135 07/11/2015: ALT 24   Lipid Panel    Component Value Date/Time   CHOL 150 07/11/2015 0748   CHOL 243 (H) 03/15/2013 0831   TRIG 146 07/11/2015 0748    TRIG 245 (H) 03/15/2013 0831   HDL 56 07/11/2015 0748   HDL 53 03/15/2013 0831   CHOLHDL 2.7 07/11/2015 0748   VLDL 29 07/11/2015 0748   LDLCALC 65 07/11/2015 0748   LDLCALC 141 (H) 03/15/2013 0831   LDLDIRECT 98.0 06/28/2014 0751    Additional studies/ records that were reviewed today include:  2-D echo 04/2015 Study Conclusions   - Left ventricle: The cavity size was mildly dilated. Wall   thickness was normal. Systolic function was moderately to   severely reduced. The  estimated ejection fraction was in the   range of 30% to 35%. Anteroseptal akinesis, mid to apical   inferoseptal akinesis, apical anterior akinesis, apical inferior   akinesis, akinesis of the true apex. Doppler parameters are   consistent with abnormal left ventricular relaxation (grade 1   diastolic dysfunction). - Aortic valve: There was no stenosis. - Aorta: Mildly dilated aortic root. Aortic root dimension: 38 mm   (ED). - Mitral valve: There was trivial regurgitation. - Left atrium: The atrium was mildly dilated. - Right ventricle: The cavity size was normal. Pacer wire or   catheter noted in right ventricle. Systolic function was normal. - Tricuspid valve: Peak RV-RA gradient (S): 23 mm Hg. - Pulmonary arteries: PA peak pressure: 31 mm Hg (S). - Systemic veins: IVC measured 2.4 cm with normal respirophasic   variation, suggesting RA pressure 8 mmHg.   Impressions:   - Mildly dilated LV. EF 30-35%. Wall motion abnormalities as noted   above in the distribution of LAD infarction. Normal RV size and   systolic function. No significant valvular abnormalities.   Transthoracic echocardiography.  M-mode, complete 2D, spectral Doppler, and color Doppler.  Birthdate:  Patient birthdate: 1955-03-31.  Age:  Patient is 61 yr old.  Sex:  Gender: male. BMI: 30 kg/m^2.  Blood pressure:     140/70  Patient status: Outpatient.  Study date:  Study date: 05/08/2015. Study time: 10:39 AM.  Location:  [facility]  3     ASSESSMENT:    1. Ischemic cardiomyopathy   2. Essential hypertension   3. Implantable defibrillator reprogramming/check   4. Chronic systolic CHF (congestive heart failure), NYHA class 3 ([place])   5. Dyslipidemia   6. Tobacco abuse      PLAN:  In order of problems listed above:  Ischemic cardiomyopathy EF 30-35% currently compensated without heart failure  Essential hypertension blood pressure is elevated today. He's been using a lot of decongestants for upper respiratory infection. He also ate out last night. Blood pressure was elevated at last office visit as well. Will increase Coreg to 12.5 mg twice a day. 2 g sodium diet. Stop all medication with DM. Come back in 2 weeks for blood pressure check.  ICD followed closely by Dr. Lovena Le  Chronic systolic heart failure currently compensated  Dyslipidemia patient was supposed to restart his simvastatin but did not. He is asking for labs to be done today which we will do. He is fasting. Restart simvastatin at 10 mg daily. Depending on his lipid panel may have him see Megan back in the lipid clinic.  Tobacco abuse long discussion about smoking cessation. He had trouble on Chantix and neck and arm patches in the past. He like to try a lower dose nicotine patch.     Medication Adjustments/Labs and Tests Ordered: Current medicines are reviewed at length with the patient today.  Concerns regarding medicines are outlined above.  Medication changes, Labs and Tests ordered today are listed in the Patient Instructions below. Patient Instructions  Medication Instructions:  Your physician has recommended you make the following change in your medication:  1.  INCREASE the Coreg to 12.5 mg taking 1 tablet twice a day 2.  RESTART  the Simvastatin 10 mg taking 1 tablet daily 3.  START Nicotine Patch 14 mg wear 1 patch a day X's 2 weeks then Start the 7 mg patch   Labwork: TODAY:  CMET, LIPID, & CBC  Testing/Procedures: None  ordered  Follow-Up: Your physician recommends that  you schedule a follow-up appointment in: [place]  Your physician wants you to follow-up in: 6 MONTHS WITH DR. Mallie Snooks will receive a reminder letter in the mail two months in advance. If you don't receive a letter, please call our office to schedule the follow-up appointment.    Any Other Special Instructions Will Be Listed Below (If Applicable).  STOP ALL OVER THE COUNTER MEDICATIONS THAT HAVE "DM" IN IT   Steps to Quit Smoking Smoking tobacco can be harmful to your health and can affect almost every organ in your body. Smoking puts you, and those around you, at risk for developing many serious chronic diseases. Quitting smoking is difficult, but it is one of the best things that you can do for your health. It is never too late to quit. What are the benefits of quitting smoking? When you quit smoking, you lower your risk of developing serious diseases and conditions, such as:  Lung cancer or lung disease, such as COPD.  Heart disease.  Stroke.  Heart attack.  Infertility.  Osteoporosis and bone fractures. Additionally, symptoms such as coughing, wheezing, and shortness of breath may get better when you quit. You may also find that you get sick less often because your body is stronger at fighting off colds and infections. If you are pregnant, quitting smoking can help to reduce your chances of having a baby of low birth weight. How do I get ready to quit? When you decide to quit smoking, create a plan to make sure that you are successful. Before you quit:  Pick a date to quit. Set a date within the next two weeks to give you time to prepare.  Write down the reasons why you are quitting. Keep this list in places where you will see it often, such as on your bathroom mirror or in your car or wallet.  Identify the people, places, things, and activities that make you want to smoke (triggers)  and avoid them. Make sure to take these actions:  Throw away all cigarettes at home, at work, and in your car.  Throw away smoking accessories, such as Scientist, research (medical).  Clean your car and make sure to empty the ashtray.  Clean your home, including curtains and carpets.  Tell your family, friends, and coworkers that you are quitting. Support from your loved ones can make quitting easier.  Talk with your health care provider about your options for quitting smoking.  Find out what treatment options are covered by your health insurance. What strategies can I use to quit smoking? Talk with your healthcare provider about different strategies to quit smoking. Some strategies include:  Quitting smoking altogether instead of gradually lessening how much you smoke over a period of time. Research shows that quitting "cold [place]" is more successful than gradually quitting.  Attending in-person counseling to help you build problem-solving skills. You are more likely to have success in quitting if you attend several counseling sessions. Even short sessions of 10 minutes can be effective.  Finding resources and support systems that can help you to quit smoking and remain smoke-free after you quit. These resources are most helpful when you use them often. They can include:  Online chats with a Social worker.  Telephone quitlines.  Printed Furniture conservator/restorer.  Support groups or group counseling.  Text messaging programs.  Mobile phone applications.  Taking medicines to help you quit smoking. (If you are pregnant or breastfeeding, talk  with your health care provider first.) Some medicines contain nicotine and some do not. Both types of medicines help with cravings, but the medicines that include nicotine help to relieve withdrawal symptoms. Your health care provider may recommend:  Nicotine patches, gum, or lozenges.  Nicotine inhalers or sprays.  Non-nicotine medicine that is taken by  mouth. Talk with your health care provider about combining strategies, such as taking medicines while you are also receiving in-person counseling. Using these two strategies together makes you more likely to succeed in quitting than if you used either strategy on its own. If you are pregnant or breastfeeding, talk with your health care provider about finding counseling or other support strategies to quit smoking. Do not take medicine to help you quit smoking unless told to do so by your health care provider. What things can I do to make it easier to quit? Quitting smoking might feel overwhelming at first, but there is a lot that you can do to make it easier. Take these important actions:  Reach out to your family and friends and ask that they support and encourage you during this time. Call telephone quitlines, reach out to support groups, or work with a counselor for support.  Ask people who smoke to avoid smoking around you.  Avoid places that trigger you to smoke, such as bars, parties, or smoke-break areas at work.  Spend time around people who do not smoke.  Lessen stress in your life, because stress can be a smoking trigger for some people. To lessen stress, try:  Exercising regularly.  Deep-breathing exercises.  Yoga.  Meditating.  Performing a body scan. This involves closing your eyes, scanning your body from head to toe, and noticing which parts of your body are particularly tense. Purposefully relax the muscles in those areas.  Download or purchase mobile phone or tablet apps (applications) that can help you stick to your quit plan by providing reminders, tips, and encouragement. There are many free apps, such as QuitGuide from the [employer] Office manager for Disease Control and Prevention). You can find other support for quitting smoking (smoking cessation) through smokefree.gov and other websites. How will I feel when I quit smoking? Within the first 24 hours of quitting smoking, you  may start to feel some withdrawal symptoms. These symptoms are usually most noticeable 2-3 days after quitting, but they usually do not last beyond 2-3 weeks. Changes or symptoms that you might experience include:  Mood swings.  Restlessness, anxiety, or irritation.  Difficulty concentrating.  Dizziness.  Strong cravings for sugary foods in addition to nicotine.  Mild weight gain.  Constipation.  Nausea.  Coughing or a sore throat.  Changes in how your medicines work in your body.  A depressed mood.  Difficulty sleeping (insomnia). After the first 2-3 weeks of quitting, you may start to notice more positive results, such as:  Improved sense of smell and taste.  Decreased coughing and sore throat.  Slower heart rate.  Lower blood pressure.  Clearer skin.  The ability to breathe more easily.  Fewer sick days. Quitting smoking is very challenging for most people. Do not get discouraged if you are not successful the first time. Some people need to make many attempts to quit before they achieve long-term success. Do your best to stick to your quit plan, and talk with your health care provider if you have any questions or concerns. This information is not intended to replace advice given to you by your health care provider. Make  sure you discuss any questions you have with your health care provider. Document Released: 12/24/2000 Document Revised: 08/28/2015 Document Reviewed: 05/16/2014 Elsevier Interactive Patient Education  2017 [place] Many factors influence your heart health, including eating and exercise habits. Heart (coronary) risk increases with abnormal blood fat (lipid) levels. Heart-healthy meal planning includes limiting unhealthy fats, increasing healthy fats, and making other small dietary changes. This includes maintaining a healthy body weight to help keep lipid levels within a normal range. What is my plan? Your  health care provider recommends that you:  Get no more than _________% of the total calories in your daily diet from fat.  Limit your intake of saturated fat to less than _________% of your total calories each day.  Limit the amount of cholesterol in your diet to less than _________ mg per day. What types of fat should I choose?  Choose healthy fats more often. Choose monounsaturated and polyunsaturated fats, such as olive oil and canola oil, flaxseeds, walnuts, almonds, and seeds.  Eat more omega-3 fats. Good choices include salmon, mackerel, sardines, tuna, flaxseed oil, and ground flaxseeds. Aim to eat fish at least two times each week.  Limit saturated fats. Saturated fats are primarily found in animal products, such as meats, butter, and cream. Plant sources of saturated fats include palm oil, palm kernel oil, and coconut oil.  Avoid foods with partially hydrogenated oils in them. These contain trans fats. Examples of foods that contain trans fats are stick margarine, some tub margarines, cookies, crackers, and other baked goods. What general guidelines do I need to follow?  Check food labels carefully to identify foods with trans fats or high amounts of saturated fat.  Fill one half of your plate with vegetables and green salads. Eat 4-5 servings of vegetables per day. A serving of vegetables equals 1 cup of raw leafy vegetables,  cup of raw or cooked cut-up vegetables, or  cup of vegetable juice.  Fill one fourth of your plate with whole grains. Look for the word "whole" as the first word in the ingredient list.  Fill one fourth of your plate with lean protein foods.  Eat 4-5 servings of fruit per day. A serving of fruit equals one medium whole fruit,  cup of dried fruit,  cup of fresh, frozen, or canned fruit, or  cup of 100% fruit juice.  Eat more foods that contain soluble fiber. Examples of foods that contain this type of fiber are apples, broccoli, carrots, beans, peas,  and barley. Aim to get 20-30 g of fiber per day.  Eat more home-cooked food and less restaurant, buffet, and fast food.  Limit or avoid alcohol.  Limit foods that are high in starch and sugar.  Avoid fried foods.  Cook foods by using methods other than frying. Baking, boiling, grilling, and broiling are all great options. Other fat-reducing suggestions include:  Removing the skin from poultry.  Removing all visible fats from meats.  Skimming the fat off of stews, soups, and gravies before serving them.  Steaming vegetables in water or broth.  Lose weight if you are overweight. Losing just 5-10% of your initial body weight can help your overall health and prevent diseases such as diabetes and heart disease.  Increase your consumption of nuts, legumes, and seeds to 4-5 servings per week. One serving of dried beans or legumes equals  cup after being cooked, one serving of nuts equals 1 ounces, and one serving of  seeds equals  ounce or 1 tablespoon.  You may need to monitor your salt (sodium) intake, especially if you have high blood pressure. Talk with your health care provider or dietitian to get more information about reducing sodium. What foods can I eat? Grains  Breads, including [place], white, pita, wheat, raisin, rye, oatmeal, and [place]. Tortillas that are neither fried nor made with lard or trans fat. Low-fat rolls, including hotdog and hamburger buns and English muffins. Biscuits. Muffins. Waffles. Pancakes. Light popcorn. Whole-grain cereals. Flatbread. Melba toast. Pretzels. Breadsticks. Rusks. Low-fat snacks and crackers, including oyster, saltine, matzo, graham, animal, and rye. Rice and pasta, including brown rice and those that are made with whole wheat. Vegetables  All vegetables. Fruits  All fruits, but limit coconut. Meats and Other Protein Sources  Lean, well-trimmed beef, veal, pork, and lamb. Chicken and [place] without skin. All fish and shellfish. Wild duck,  rabbit, pheasant, and venison. Egg whites or low-cholesterol egg substitutes. Dried beans, peas, lentils, and tofu.Seeds and most nuts. Dairy  Low-fat or nonfat cheeses, including ricotta, string, and mozzarella. Skim or 1% milk that is liquid, powdered, or evaporated. Buttermilk that is made with low-fat milk. Nonfat or low-fat yogurt. Beverages  Mineral water. Diet carbonated beverages. Sweets and Desserts  Sherbets and fruit ices. Honey, jam, marmalade, jelly, and syrups. Meringues and gelatins. Pure sugar candy, such as hard candy, jelly beans, gumdrops, mints, marshmallows, and small amounts of dark chocolate. [employer] Eat all sweets and desserts in moderation. Fats and Oils  Nonhydrogenated (trans-free) margarines. Vegetable oils, including soybean, sesame, sunflower, olive, peanut, safflower, corn, canola, and cottonseed. Salad dressings or mayonnaise that are made with a vegetable oil. Limit added fats and oils that you use for cooking, baking, salads, and as spreads. Other  Cocoa powder. Coffee and tea. All seasonings and condiments. The items listed above may not be a complete list of recommended foods or beverages. Contact your dietitian for more options.  What foods are not recommended? Grains  Breads that are made with saturated or trans fats, oils, or whole milk. Croissants. Butter rolls. Cheese breads. Sweet rolls. Donuts. Buttered popcorn. Chow mein noodles. High-fat crackers, such as cheese or butter crackers. Meats and Other Protein Sources  Fatty meats, such as hotdogs, short ribs, sausage, spareribs, bacon, ribeye roast or steak, and mutton. High-fat deli meats, such as salami and bologna. Caviar. Domestic duck and goose. Organ meats, such as kidney, liver, sweetbreads, brains, gizzard, chitterlings, and heart. Dairy  Cream, sour cream, cream cheese, and creamed cottage cheese. Whole milk cheeses, including blue (bleu), Monterey Jack, [place], [place], American, [place],  Swiss, [place], [place], and [place]. Whole or 2% milk that is liquid, evaporated, or condensed. Whole buttermilk. Cream sauce or high-fat cheese sauce. Yogurt that is made from whole milk. Beverages  Regular sodas and drinks with added sugar. Sweets and Desserts  Frosting. Pudding. Cookies. Cakes other than angel food cake. Candy that has milk chocolate or white chocolate, hydrogenated fat, butter, coconut, or unknown ingredients. Buttered syrups. Full-fat ice cream or ice cream drinks. Fats and Oils  Gravy that has suet, meat fat, or shortening. Cocoa butter, hydrogenated oils, palm oil, coconut oil, palm kernel oil. These can often be found in baked products, candy, fried foods, nondairy creamers, and whipped toppings. Solid fats and shortenings, including bacon fat, salt pork, lard, and butter. Nondairy cream substitutes, such as coffee creamers and sour cream substitutes. Salad dressings that are made of unknown oils, cheese, or sour cream. The  items listed above may not be a complete list of foods and beverages to avoid. Contact your dietitian for more information.  This information is not intended to replace advice given to you by your health care provider. Make sure you discuss any questions you have with your health care provider. Document Released: 10/09/2007 Document Revised: 07/20/2015 Document Reviewed: 06/23/2013 Elsevier Interactive Patient Education  2017 [employer].    If you need a refill on your cardiac medications before your next appointment, please call your pharmacy.      Sumner Boast, PA-C  02/14/2016 10:24 AM    [facility] [place], [place], [place]  [address] Phone: [phone]; Fax: [phone]

## 2016-02-14 NOTE — Patient Instructions (Addendum)
Medication Instructions:  Your physician has recommended you make the following change in your medication:  1.  INCREASE the Coreg to 12.5 mg taking 1 tablet twice a day 2.  RESTART  the Simvastatin 10 mg taking 1 tablet daily 3.  START Nicotine Patch 14 mg wear 1 patch a day X's 2 weeks then Start the 7 mg patch   Labwork: TODAY:  CMET, LIPID, & CBC  Testing/Procedures: None ordered  Follow-Up: Your physician recommends that you schedule a follow-up appointment in: [place]  Your physician wants you to follow-up in: [place] DR. Mallie Snooks will receive a reminder letter in the mail two months in advance. If you don't receive a letter, please call our office to schedule the follow-up appointment.    Any Other Special Instructions Will Be Listed Below (If Applicable).  STOP ALL OVER THE COUNTER MEDICATIONS THAT HAVE "DM" IN IT   Steps to Quit Smoking Smoking tobacco can be harmful to your health and can affect almost every organ in your body. Smoking puts you, and those around you, at risk for developing many serious chronic diseases. Quitting smoking is difficult, but it is one of the best things that you can do for your health. It is never too late to quit. What are the benefits of quitting smoking? When you quit smoking, you lower your risk of developing serious diseases and conditions, such as:  Lung cancer or lung disease, such as COPD.  Heart disease.  Stroke.  Heart attack.  Infertility.  Osteoporosis and bone fractures. Additionally, symptoms such as coughing, wheezing, and shortness of breath may get better when you quit. You may also find that you get sick less often because your body is stronger at fighting off colds and infections. If you are pregnant, quitting smoking can help to reduce your chances of having a baby of low birth weight. How do I get ready to quit? When you decide to quit smoking, create a plan to make  sure that you are successful. Before you quit:  Pick a date to quit. Set a date within the next two weeks to give you time to prepare.  Write down the reasons why you are quitting. Keep this list in places where you will see it often, such as on your bathroom mirror or in your car or wallet.  Identify the people, places, things, and activities that make you want to smoke (triggers) and avoid them. Make sure to take these actions:  Throw away all cigarettes at home, at work, and in your car.  Throw away smoking accessories, such as Scientist, research (medical).  Clean your car and make sure to empty the ashtray.  Clean your home, including curtains and carpets.  Tell your family, friends, and coworkers that you are quitting. Support from your loved ones can make quitting easier.  Talk with your health care provider about your options for quitting smoking.  Find out what treatment options are covered by your health insurance. What strategies can I use to quit smoking? Talk with your healthcare provider about different strategies to quit smoking. Some strategies include:  Quitting smoking altogether instead of gradually lessening how much you smoke over a period of time. Research shows that quitting "cold [place]" is more successful than gradually quitting.  Attending in-person counseling to help you build problem-solving skills. You are more likely to have success in quitting if you attend several counseling sessions. Even  short sessions of 10 minutes can be effective.  Finding resources and support systems that can help you to quit smoking and remain smoke-free after you quit. These resources are most helpful when you use them often. They can include:  Online chats with a Social worker.  Telephone quitlines.  Printed Furniture conservator/restorer.  Support groups or group counseling.  Text messaging programs.  Mobile phone applications.  Taking medicines to help you quit smoking. (If you are  pregnant or breastfeeding, talk with your health care provider first.) Some medicines contain nicotine and some do not. Both types of medicines help with cravings, but the medicines that include nicotine help to relieve withdrawal symptoms. Your health care provider may recommend:  Nicotine patches, gum, or lozenges.  Nicotine inhalers or sprays.  Non-nicotine medicine that is taken by mouth. Talk with your health care provider about combining strategies, such as taking medicines while you are also receiving in-person counseling. Using these two strategies together makes you more likely to succeed in quitting than if you used either strategy on its own. If you are pregnant or breastfeeding, talk with your health care provider about finding counseling or other support strategies to quit smoking. Do not take medicine to help you quit smoking unless told to do so by your health care provider. What things can I do to make it easier to quit? Quitting smoking might feel overwhelming at first, but there is a lot that you can do to make it easier. Take these important actions:  Reach out to your family and friends and ask that they support and encourage you during this time. Call telephone quitlines, reach out to support groups, or work with a counselor for support.  Ask people who smoke to avoid smoking around you.  Avoid places that trigger you to smoke, such as bars, parties, or smoke-break areas at work.  Spend time around people who do not smoke.  Lessen stress in your life, because stress can be a smoking trigger for some people. To lessen stress, try:  Exercising regularly.  Deep-breathing exercises.  Yoga.  Meditating.  Performing a body scan. This involves closing your eyes, scanning your body from head to toe, and noticing which parts of your body are particularly tense. Purposefully relax the muscles in those areas.  Download or purchase mobile phone or tablet apps (applications)  that can help you stick to your quit plan by providing reminders, tips, and encouragement. There are many free apps, such as QuitGuide from the [employer] Office manager for Disease Control and Prevention). You can find other support for quitting smoking (smoking cessation) through smokefree.gov and other websites. How will I feel when I quit smoking? Within the first 24 hours of quitting smoking, you may start to feel some withdrawal symptoms. These symptoms are usually most noticeable 2-3 days after quitting, but they usually do not last beyond 2-3 weeks. Changes or symptoms that you might experience include:  Mood swings.  Restlessness, anxiety, or irritation.  Difficulty concentrating.  Dizziness.  Strong cravings for sugary foods in addition to nicotine.  Mild weight gain.  Constipation.  Nausea.  Coughing or a sore throat.  Changes in how your medicines work in your body.  A depressed mood.  Difficulty sleeping (insomnia). After the first 2-3 weeks of quitting, you may start to notice more positive results, such as:  Improved sense of smell and taste.  Decreased coughing and sore throat.  Slower heart rate.  Lower blood pressure.  Clearer skin.  The ability  to breathe more easily.  Fewer sick days. Quitting smoking is very challenging for most people. Do not get discouraged if you are not successful the first time. Some people need to make many attempts to quit before they achieve long-term success. Do your best to stick to your quit plan, and talk with your health care provider if you have any questions or concerns. This information is not intended to replace advice given to you by your health care provider. Make sure you discuss any questions you have with your health care provider. Document Released: 12/24/2000 Document Revised: 08/28/2015 Document Reviewed: 05/16/2014 Elsevier Interactive Patient Education  2017 [place] Many  factors influence your heart health, including eating and exercise habits. Heart (coronary) risk increases with abnormal blood fat (lipid) levels. Heart-healthy meal planning includes limiting unhealthy fats, increasing healthy fats, and making other small dietary changes. This includes maintaining a healthy body weight to help keep lipid levels within a normal range. What is my plan? Your health care provider recommends that you:  Get no more than _________% of the total calories in your daily diet from fat.  Limit your intake of saturated fat to less than _________% of your total calories each day.  Limit the amount of cholesterol in your diet to less than _________ mg per day. What types of fat should I choose?  Choose healthy fats more often. Choose monounsaturated and polyunsaturated fats, such as olive oil and canola oil, flaxseeds, walnuts, almonds, and seeds.  Eat more omega-3 fats. Good choices include salmon, mackerel, sardines, tuna, flaxseed oil, and ground flaxseeds. Aim to eat fish at least two times each week.  Limit saturated fats. Saturated fats are primarily found in animal products, such as meats, butter, and cream. Plant sources of saturated fats include palm oil, palm kernel oil, and coconut oil.  Avoid foods with partially hydrogenated oils in them. These contain trans fats. Examples of foods that contain trans fats are stick margarine, some tub margarines, cookies, crackers, and other baked goods. What general guidelines do I need to follow?  Check food labels carefully to identify foods with trans fats or high amounts of saturated fat.  Fill one half of your plate with vegetables and green salads. Eat 4-5 servings of vegetables per day. A serving of vegetables equals 1 cup of raw leafy vegetables,  cup of raw or cooked cut-up vegetables, or  cup of vegetable juice.  Fill one fourth of your plate with whole grains. Look for the word "whole" as the first word in the  ingredient list.  Fill one fourth of your plate with lean protein foods.  Eat 4-5 servings of fruit per day. A serving of fruit equals one medium whole fruit,  cup of dried fruit,  cup of fresh, frozen, or canned fruit, or  cup of 100% fruit juice.  Eat more foods that contain soluble fiber. Examples of foods that contain this type of fiber are apples, broccoli, carrots, beans, peas, and barley. Aim to get 20-30 g of fiber per day.  Eat more home-cooked food and less restaurant, buffet, and fast food.  Limit or avoid alcohol.  Limit foods that are high in starch and sugar.  Avoid fried foods.  Cook foods by using methods other than frying. Baking, boiling, grilling, and broiling are all great options. Other fat-reducing suggestions include:  Removing the skin from poultry.  Removing all visible fats from meats.  Skimming the fat off  of stews, soups, and gravies before serving them.  Steaming vegetables in water or broth.  Lose weight if you are overweight. Losing just 5-10% of your initial body weight can help your overall health and prevent diseases such as diabetes and heart disease.  Increase your consumption of nuts, legumes, and seeds to 4-5 servings per week. One serving of dried beans or legumes equals  cup after being cooked, one serving of nuts equals 1 ounces, and one serving of seeds equals  ounce or 1 tablespoon.  You may need to monitor your salt (sodium) intake, especially if you have high blood pressure. Talk with your health care provider or dietitian to get more information about reducing sodium. What foods can I eat? Grains  Breads, including [place], white, pita, wheat, raisin, rye, oatmeal, and [place]. Tortillas that are neither fried nor made with lard or trans fat. Low-fat rolls, including hotdog and hamburger buns and English muffins. Biscuits. Muffins. Waffles. Pancakes. Light popcorn. Whole-grain cereals. Flatbread. Melba toast. Pretzels. Breadsticks.  Rusks. Low-fat snacks and crackers, including oyster, saltine, matzo, graham, animal, and rye. Rice and pasta, including brown rice and those that are made with whole wheat. Vegetables  All vegetables. Fruits  All fruits, but limit coconut. Meats and Other Protein Sources  Lean, well-trimmed beef, veal, pork, and lamb. Chicken and [place] without skin. All fish and shellfish. Wild duck, rabbit, pheasant, and venison. Egg whites or low-cholesterol egg substitutes. Dried beans, peas, lentils, and tofu.Seeds and most nuts. Dairy  Low-fat or nonfat cheeses, including ricotta, string, and mozzarella. Skim or 1% milk that is liquid, powdered, or evaporated. Buttermilk that is made with low-fat milk. Nonfat or low-fat yogurt. Beverages  Mineral water. Diet carbonated beverages. Sweets and Desserts  Sherbets and fruit ices. Honey, jam, marmalade, jelly, and syrups. Meringues and gelatins. Pure sugar candy, such as hard candy, jelly beans, gumdrops, mints, marshmallows, and small amounts of dark chocolate. [employer] Eat all sweets and desserts in moderation. Fats and Oils  Nonhydrogenated (trans-free) margarines. Vegetable oils, including soybean, sesame, sunflower, olive, peanut, safflower, corn, canola, and cottonseed. Salad dressings or mayonnaise that are made with a vegetable oil. Limit added fats and oils that you use for cooking, baking, salads, and as spreads. Other  Cocoa powder. Coffee and tea. All seasonings and condiments. The items listed above may not be a complete list of recommended foods or beverages. Contact your dietitian for more options.  What foods are not recommended? Grains  Breads that are made with saturated or trans fats, oils, or whole milk. Croissants. Butter rolls. Cheese breads. Sweet rolls. Donuts. Buttered popcorn. Chow mein noodles. High-fat crackers, such as cheese or butter crackers. Meats and Other Protein Sources  Fatty meats, such as hotdogs, short ribs,  sausage, spareribs, bacon, ribeye roast or steak, and mutton. High-fat deli meats, such as salami and bologna. Caviar. Domestic duck and goose. Organ meats, such as kidney, liver, sweetbreads, brains, gizzard, chitterlings, and heart. Dairy  Cream, sour cream, cream cheese, and creamed cottage cheese. Whole milk cheeses, including blue (bleu), Monte[place] Jack, [place], [place], [place], [place], Swiss, [place], [place], and [place]. Whole or 2% milk that is liquid, evaporated, or condensed. Whole buttermilk. Cream sauce or high-fat cheese sauce. Yogurt that is made from whole milk. Beverages  Regular sodas and drinks with added sugar. Sweets and Desserts  Frosting. Pudding. Cookies. Cakes other than angel food cake. Candy that has milk chocolate or white chocolate, hydrogenated fat, butter, coconut, or unknown ingredients. Buttered syrups.  Full-fat ice cream or ice cream drinks. Fats and Oils  Gravy that has suet, meat fat, or shortening. Cocoa butter, hydrogenated oils, palm oil, coconut oil, palm kernel oil. These can often be found in baked products, candy, fried foods, nondairy creamers, and whipped toppings. Solid fats and shortenings, including bacon fat, salt pork, lard, and butter. Nondairy cream substitutes, such as coffee creamers and sour cream substitutes. Salad dressings that are made of unknown oils, cheese, or sour cream. The items listed above may not be a complete list of foods and beverages to avoid. Contact your dietitian for more information.  This information is not intended to replace advice given to you by your health care provider. Make sure you discuss any questions you have with your health care provider. Document Released: 10/09/2007 Document Revised: 07/20/2015 Document Reviewed: 06/23/2013 Elsevier Interactive Patient Education  2017 [employer].    If you need a refill on your cardiac medications before your next appointment, please call your pharmacy.

## 2016-02-18 ENCOUNTER — Telehealth: Payer: Self-pay | Admitting: Physician Assistant

## 2016-02-18 NOTE — Telephone Encounter (Signed)
Patient is returning call, states that he received a vm about his results. Thank you.

## 2016-02-18 NOTE — Telephone Encounter (Signed)
-----   Message from Imogene Burn, PA-C sent at 02/17/2016  4:05 PM EST ----- Labs stable

## 2016-02-18 NOTE — Telephone Encounter (Signed)
New Message     Pt is returning Jennifers call about lab results

## 2016-02-18 NOTE — Telephone Encounter (Signed)
Returned pts call and discussed his lab results. Pt verbalized understanding. 

## 2016-02-20 NOTE — Progress Notes (Signed)
Patient ID: Joe [name]                 DOB: [birth year]                      MRN: [id number]     HPI: Joe [name] is a 61 y.o. male referred to HTN clinic by  Ermalinda Barrios, PA. PMH is significant for CAD s/p anterior wall MI treated with BMS to LAD, chronic systolic HF with LVEF [id number] on echo 04/2015, HTN, HLD, s/p ICD implant, obesity, and OSA on CPAP. BP was elevated at last visit, pt advised to stop OTC products with dextromethorphan in them and to increase carvedilol 12.5mg  BID.  LDL increased from 65 to 109 since stopping simvastatin 10mg  daily. Also intolerant to rosuvastatin 5mg  3x/week, 5mg  daily, 20mg  daily, and unknown dose of atorvastatin. States he tried restarting simvastatin for 3 days and had a hard time moving due to myalgias. Is taking Zetia 10mg  daily, fenofibrate 160mg  daily, and fish oil 4g daily and tolerating them well.  Taking valsartan every other day and is a bit confused about his BP medications. Reports adherence to carvedilol. Has not picked up his nicotine patch yet.  Current HTN meds: valsartan 80mg  daily, carvedilol 12.5mg  BID BP goal: <130/68mmHg  Family History: Aneurysm in his mother.  Social History: Smoker 1 PPD for 34 years, currently on nicotine patch. Occasional alcohol use, denies illicit drug use.  Diet: Trying to avoid red meats and fried foods.  But does still eat both of these a couple times a week Increasing fruits and vegetables.   Exercise: Walking a lot throughout the day when it is nice outside.  Is limited due to back pain.  Wt Readings from Last 3 Encounters:  02/14/16 244 lb (110.7 kg)  09/13/15 234 lb 6.4 oz (106.3 kg)  05/04/15 240 lb 1.9 oz (108.9 kg)   BP Readings from Last 3 Encounters:  02/14/16 (!) 166/100  09/13/15 (!) 142/90  05/04/15 140/70   Pulse Readings from Last 3 Encounters:  02/14/16 62  09/13/15 82  05/04/15 80    Renal function: Estimated Creatinine Clearance: 124 mL/min (by C-G formula based  on SCr of 0.86 mg/dL).  Past Medical History:  Diagnosis Date  . Chronic systolic CHF (congestive heart failure), NYHA class 1 (HCC)    EF 37% by nuclear stress test  . Coronary artery disease    s/p PCI mild LAD (BMS) in setting of AWMI complicated by Vfib arrest with 40-50% residual disesae in left circ  . Hyperlipidemia   . Ischemic cardiomyopathy    A. 01/09/2011 - s/p St. Jude Fortify ST VR [place] AICD  . OSA (obstructive sleep apnea)    severe with AHI 26/hr now on CPAP at 11cmH2O  . VF (ventricular fibrillation) ([place])    arrest in setting of AMI 6/11    Current Outpatient Prescriptions on File Prior to Visit  Medication Sig Dispense Refill  . aspirin 81 MG tablet Take 81 mg by mouth daily.      . carvedilol (COREG) 12.5 MG tablet TAKE 1 TABLET(6.25 MG) BY MOUTH TWICE DAILY WITH A MEAL 180 tablet 3  . co-enzyme Q-10 30 MG capsule Take 5 capsules (150 mg total) by mouth daily.    Marland Kitchen ezetimibe (ZETIA) 10 MG tablet Take 1 tablet (10 mg total) by mouth daily. 90 tablet 1  . fenofibrate 160 MG tablet TAKE 1 TABLET BY MOUTH DAILY 30 tablet 4  .  fish oil-omega-3 fatty acids 1000 MG capsule Take 4 g by mouth daily.     . nicotine (NICODERM CQ - DOSED IN MG/24 HOURS) 14 mg/24hr patch Place 1 patch (14 mg total) onto the skin daily. 14 patch 0  . nicotine (NICODERM CQ - DOSED IN MG/24 HR) 7 mg/24hr patch Place 1 patch (7 mg total) onto the skin daily. 14 patch 0  . nitroGLYCERIN (NITROSTAT) 0.4 MG SL tablet Place 1 tablet (0.4 mg total) under the tongue every 5 (five) minutes as needed for chest pain. Up to 3 doses 25 tablet 3  . simvastatin (ZOCOR) 10 MG tablet Take 1 tablet (10 mg total) by mouth at bedtime. 90 tablet 3  . valsartan (DIOVAN) 80 MG tablet TAKE 1 TABLET BY MOUTH EVERY DAY AS DIRECTED 30 tablet 6   No current facility-administered medications on file prior to visit.     Allergies  Allergen Reactions  . Imdur [Isosorbide Dinitrate] Other (See Comments)    headache   .  Crestor [Rosuvastatin]     Muscle aches on 5 mg qd and 20 mg qd  . Lipitor [Atorvastatin]     Made his irritable and anxious     Assessment/Plan:  1. Hypertension - BP improved but still above goal <130/64mmHg. Pt was taking his valsartan every other day. Will increase to valsartan 160mg  daily and discussed that pt needs to take it daily. Will continue carvedilol 12.5mg  BID. Will f/u in 2 weeks in clinic with BP and BMET check.  2. Hyperlipidemia - Pt tried restarting his simvastatin 10mg  but had to stop after a few days because of the myalgias. He is also intolerant to Crestor 5mg  3 days per week and Lipitor. Takes Zetia 10mg  daily, fish oil 4g daily, fenofibrate 160mg  daily, and CoQ10. LDL has increased to 109 above goal <70 given hx of ASCVD. Discussed PCSK9i vs clinical trials with pt. Cost of PCSK9i will be prohibitive and pt does not like needles. He may be a candidate for the CLEAR study. Will forward info to our research nurses to bring pt in for screening.  3. Smoking cessation - Still smoking 1/2 to 1 PPD. Pt is going to pick up nicotine patch today.   Blandon Offerdahl E. Eleina Jergens, PharmD, CPP, [place] [id number], [place], [place] [address] Phone: [phone]; Fax: [phone] 02/21/2016 9:33 AM

## 2016-02-21 ENCOUNTER — Ambulatory Visit (INDEPENDENT_AMBULATORY_CARE_PROVIDER_SITE_OTHER): Payer: Medicare HMO | Admitting: Pharmacist

## 2016-02-21 VITALS — BP 136/76 | HR 66 | Wt 243.0 lb

## 2016-02-21 DIAGNOSIS — E785 Hyperlipidemia, unspecified: Secondary | ICD-10-CM | POA: Diagnosis not present

## 2016-02-21 DIAGNOSIS — Z720 Tobacco use: Secondary | ICD-10-CM

## 2016-02-21 DIAGNOSIS — I10 Essential (primary) hypertension: Secondary | ICD-10-CM | POA: Diagnosis not present

## 2016-02-21 MED ORDER — CARVEDILOL 12.5 MG PO TABS: 12.5000 mg | ORAL_TABLET | Freq: Two times a day (BID) | ORAL | 3 refills | Status: DC

## 2016-02-21 MED ORDER — VALSARTAN 160 MG PO TABS: 160.0000 mg | ORAL_TABLET | Freq: Every day | ORAL | 11 refills | Status: DC

## 2016-02-21 NOTE — Patient Instructions (Addendum)
Continue taking carvedilol 12.5mg  twice a day  Increase valsartan to 160mg  once a day  Refills sent to your pharmacy for both medications  I will refer you to our research nurses for a cholesterol trial  Pick up your nicotine patches today to start using  Follow up in blood pressure clinic in 2 weeks

## 2016-02-25 ENCOUNTER — Telehealth: Payer: Self-pay | Admitting: *Deleted

## 2016-02-25 NOTE — Telephone Encounter (Signed)
I called patient to discuss [place] 10 study and Clear Study. Patient will come in tomorrow to discuss Pump Back -10 study. Appointment made on 02/26/16 at  8:15 am.

## 2016-02-26 ENCOUNTER — Telehealth: Payer: Self-pay | Admitting: Cardiology

## 2016-02-26 ENCOUNTER — Encounter: Payer: Self-pay | Admitting: Cardiology

## 2016-02-26 ENCOUNTER — Encounter: Payer: Self-pay | Admitting: *Deleted

## 2016-02-26 DIAGNOSIS — Z006 Encounter for examination for normal comparison and control in clinical research program: Secondary | ICD-10-CM

## 2016-02-26 NOTE — Telephone Encounter (Signed)
Spoke w/ pt and requested that he send a manual transmission b/c his home monitor has not updated in at least 7 days.   

## 2016-02-26 NOTE — Progress Notes (Signed)
Joe [name] meets inclusion/exclusion criteria for the [name]-10 study. The study was explained to him including the risk versus benefit. He was given a consent to read and he was given an opportunity to ask question of the Research Nurse and Control and instrumentation engineer Dr. Lia Foyer. Joe [name] agreed to participate and signed the consent. No study related procedures were performed prior to the consent being signed. A copy of the signed consent was given to him to take home.

## 2016-03-04 ENCOUNTER — Encounter: Payer: Self-pay | Admitting: Cardiology

## 2016-03-04 ENCOUNTER — Encounter: Payer: Self-pay | Admitting: *Deleted

## 2016-03-04 ENCOUNTER — Other Ambulatory Visit: Payer: Self-pay | Admitting: *Deleted

## 2016-03-04 DIAGNOSIS — Z006 Encounter for examination for normal comparison and control in clinical research program: Secondary | ICD-10-CM

## 2016-03-04 MED ORDER — AMBULATORY NON FORMULARY MEDICATION: 300.0000 mg | Status: DC

## 2016-03-04 NOTE — Progress Notes (Signed)
Patient was randomized into the [name]-10 Study. Patient received first injection subcutaneously at 8:08 am in right lower abdomen. Patient had burning with injection which lasted 20 minutes with moderate intensity. The injection site looked good with no redness or swelling.

## 2016-03-05 NOTE — Progress Notes (Deleted)
Patient ID: Joe [name]                 DOB: [birth year]                      MRN: [id number]     HPI: Joe [name] is a 61 y.o. male referred to HTN clinic by  Ermalinda Barrios, PA. PMH is significant for CAD s/p anterior wall MI treated with BMS to LAD, chronic systolic HF with LVEF [id number] on echo 04/2015, HTN, HLD, s/p ICD implant, obesity, and OSA on CPAP. BP was elevated at last visit, pt advised to start taking valsartan daily and increase dose to 160mg  daily.   Needs BMET. Increase valsartan if BMET stable  LDL increased from 65 to 109 since stopping simvastatin 10mg  daily. Also intolerant to rosuvastatin 5mg  3x/week, 5mg  daily, 20mg  daily, and unknown dose of atorvastatin. States he tried restarting simvastatin for 3 days and had a hard time moving due to myalgias. Is taking Zetia 10mg  daily, fenofibrate 160mg  daily, and fish oil 4g daily and tolerating them well. Pt has been enrolled in [place] study with inclisiran since last office visit.  Taking valsartan every other day and is a bit confused about his BP medications. Reports adherence to carvedilol. Has not picked up his nicotine patch yet.  Current HTN meds: valsartan 160mg  daily, carvedilol 12.5mg  BID BP goal: <130/71mmHg  Family History: Aneurysm in his mother.  Social History: Smoker 1 PPD for 34 years, currently on nicotine patch. Occasional alcohol use, denies illicit drug use.  Diet: Trying to avoid red meats and fried foods. But does still eat both of these a couple times a week Increasing fruits and vegetables.   Exercise: Walking a lot throughout the day when it is nice outside. Is limited due to back pain.   Wt Readings from Last 3 Encounters:  02/21/16 243 lb (110.2 kg)  02/14/16 244 lb (110.7 kg)  09/13/15 234 lb 6.4 oz (106.3 kg)   BP Readings from Last 3 Encounters:  03/04/16 (!) 148/82  02/21/16 136/76  02/14/16 (!) 166/100   Pulse Readings from Last 3 Encounters:  03/04/16 67    02/21/16 66  02/14/16 62    Renal function: Estimated Creatinine Clearance: 123.7 mL/min (by C-G formula based on SCr of 0.86 mg/dL).  Past Medical History:  Diagnosis Date  . Chronic systolic CHF (congestive heart failure), NYHA class 1 (HCC)    EF 37% by nuclear stress test  . Coronary artery disease    s/p PCI mild LAD (BMS) in setting of AWMI complicated by Vfib arrest with 40-50% residual disesae in left circ  . Hyperlipidemia   . Ischemic cardiomyopathy    A. 01/09/2011 - s/p St. Jude Fortify ST VR [place] AICD  . OSA (obstructive sleep apnea)    severe with AHI 26/hr now on CPAP at 11cmH2O  . VF (ventricular fibrillation) ([place])    arrest in setting of AMI 6/11    Current Outpatient Prescriptions on File Prior to Visit  Medication Sig Dispense Refill  . AMBULATORY NON FORMULARY MEDICATION Inject 300 mg into the skin as directed. Medication Name: Inclisiran sodium vs placebo    . aspirin 81 MG tablet Take 81 mg by mouth daily.      . carvedilol (COREG) 12.5 MG tablet Take 1 tablet (12.5 mg total) by mouth 2 (two) times daily. 180 tablet 3  . co-enzyme Q-10 30 MG capsule Take 5 capsules (150 mg  total) by mouth daily.    Marland Kitchen ezetimibe (ZETIA) 10 MG tablet Take 1 tablet (10 mg total) by mouth daily. 90 tablet 1  . fenofibrate 160 MG tablet TAKE 1 TABLET BY MOUTH DAILY 30 tablet 4  . fish oil-omega-3 fatty acids 1000 MG capsule Take 4 g by mouth daily.     . nicotine (NICODERM CQ - DOSED IN MG/24 HOURS) 14 mg/24hr patch Place 1 patch (14 mg total) onto the skin daily. 14 patch 0  . nicotine (NICODERM CQ - DOSED IN MG/24 HR) 7 mg/24hr patch Place 1 patch (7 mg total) onto the skin daily. 14 patch 0  . nitroGLYCERIN (NITROSTAT) 0.4 MG SL tablet Place 1 tablet (0.4 mg total) under the tongue every 5 (five) minutes as needed for chest pain. Up to 3 doses 25 tablet 3  . valsartan (DIOVAN) 160 MG tablet Take 1 tablet (160 mg total) by mouth daily. 30 tablet 11   No current  facility-administered medications on file prior to visit.     Allergies  Allergen Reactions  . Imdur [Isosorbide Dinitrate] Other (See Comments)    headache   . Crestor [Rosuvastatin]     Muscle aches on 5 mg qd and 20 mg qd  . Lipitor [Atorvastatin]     Made his irritable and anxious     Assessment/Plan:

## 2016-03-06 ENCOUNTER — Ambulatory Visit: Payer: Medicare HMO | Admitting: Pharmacist

## 2016-03-13 ENCOUNTER — Telehealth: Payer: Self-pay | Admitting: Cardiology

## 2016-03-13 ENCOUNTER — Encounter: Payer: Medicare HMO | Admitting: *Deleted

## 2016-03-13 NOTE — Telephone Encounter (Signed)
Spoke with pt and reminded pt of remote transmission that is due today. Pt verbalized understanding.   

## 2016-03-19 ENCOUNTER — Encounter: Payer: Self-pay | Admitting: Cardiology

## 2016-03-21 ENCOUNTER — Telehealth: Payer: Self-pay | Admitting: Cardiology

## 2016-03-21 NOTE — Telephone Encounter (Signed)
LMOVM requesting that pt send manual transmission b/c home monitor has not updated in at least 7 days.    

## 2016-04-02 ENCOUNTER — Telehealth: Payer: Self-pay | Admitting: Cardiology

## 2016-04-02 ENCOUNTER — Encounter: Payer: Self-pay | Admitting: *Deleted

## 2016-04-02 DIAGNOSIS — Z006 Encounter for examination for normal comparison and control in clinical research program: Secondary | ICD-10-CM

## 2016-04-02 NOTE — Progress Notes (Signed)
[name]-10 Study visit  I saw patient for 30-day study visit. Patient is doing well without any complaints. Vital signs stable. I will see patient back in 60 days for next study visit.

## 2016-04-02 NOTE — Telephone Encounter (Signed)
Spoke w/ pt and requested that he send a manual transmission b/c his home monitor has not updated in at least 7 days.   

## 2016-04-17 ENCOUNTER — Telehealth: Payer: Self-pay | Admitting: Cardiology

## 2016-04-17 NOTE — Telephone Encounter (Signed)
Spoke w/ pt and requested that he send a manual transmission b/c his home monitor has not updated in at least 7 days.   

## 2016-04-23 ENCOUNTER — Telehealth: Payer: Self-pay

## 2016-04-23 NOTE — Telephone Encounter (Signed)
Patient called me need his Zetia refill, Called [facility] [employer] to get his zetia refill. Rx was out I gave a verbal order to refill his Zetia 10 mg one tablet daily with 90 tablets and 4 refill

## 2016-04-24 ENCOUNTER — Telehealth: Payer: Self-pay | Admitting: Cardiology

## 2016-04-24 NOTE — Telephone Encounter (Signed)
LMOVM requesting that pt send manual transmission b/c home monitor has not updated in at least 7 days.    

## 2016-05-01 ENCOUNTER — Encounter: Payer: Self-pay | Admitting: Cardiology

## 2016-05-28 ENCOUNTER — Other Ambulatory Visit: Payer: Self-pay | Admitting: Cardiology

## 2016-05-28 MED ORDER — FENOFIBRATE 160 MG PO TABS: 160.0000 mg | ORAL_TABLET | Freq: Every day | ORAL | 8 refills | Status: DC

## 2016-06-02 ENCOUNTER — Encounter: Payer: Self-pay | Admitting: *Deleted

## 2016-06-02 DIAGNOSIS — Z006 Encounter for examination for normal comparison and control in clinical research program: Secondary | ICD-10-CM

## 2016-06-02 NOTE — Progress Notes (Signed)
Subject to Research clinic for [place] 10 visit 9 Day 90 visit.  No complaints, no aes or saes to report.  Subject given injection and had no reactions or discomfort.  Stayed with [place] >30 minutes after the injection.  Next visit scheduled in 60 days.

## 2016-06-03 NOTE — Progress Notes (Signed)
Cardiology Office Note Date:  06/04/2016  Patient ID:  Joe, [name] 1955/01/31, MRN [id number] PCP:  Briscoe Deutscher, MD  Cardiologist:  Dr. Radford Pax Electrophysiologist: Dr. Lovena Le    Chief Complaint: routine device visit  History of Present Illness: Joe [name] is a 61 y.o. male with history of ICM w/ICD, chronic CHF (systolic), HTN, CAD w/PCI to LAD 7616 (complicated by VF arrest).    He comes in today to be seen for Dr. Lovena Le, last seen by him in [place] 2017, at that visit was doing well , had hx of a chronic type pain at his ICD site that was resolved/better.  No changes were made to his tx.  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  He reports infrequently he will get a sharp and fleeting (seconds in duration) pain to his chest, it is random, not exertional or positional, can happen a couple times in a day then none at all for a long time (months), no associated symptoms, dates back a year or more.   No other reports of CP,  symptoms that remind him of his MI.  He denies any palpitations, no dizziness,near syncope or syncope.  He c/w intermittent burning type discomfort at the superior aspect of his device, this has been ongoing since implant, though generally less and certainly more tolerable then when it was first implanted.  Device information: SJM single chamber ICD implanted 01/09/11, Dr. Lovena Le   Past Medical History:  Diagnosis Date  . Chronic systolic CHF (congestive heart failure), NYHA class 1 (HCC)    EF 37% by nuclear stress test  . Coronary artery disease    s/p PCI mild LAD (BMS) in setting of AWMI complicated by Vfib arrest with 40-50% residual disesae in left circ  . Hyperlipidemia   . Ischemic cardiomyopathy    A. 01/09/2011 - s/p St. Jude Fortify ST VR [place] AICD  . OSA  (obstructive sleep apnea)    severe with AHI 26/hr now on CPAP at [facility]  . VF (ventricular fibrillation) ([place])    arrest in setting of AMI 6/11    Past Surgical History:  Procedure Laterality Date  . BACK SURGERY    . BACK SURGERY    . IMPLANTABLE CARDIOVERTER DEFIBRILLATOR IMPLANT N/A 01/09/2011   Procedure: IMPLANTABLE CARDIOVERTER DEFIBRILLATOR IMPLANT;  Surgeon: Evans Lance, MD;  Location: [facility] CATH LAB;  Service: Cardiovascular;  Laterality: N/A;  . KNEE ARTHROSCOPY     right  . PACEMAKER INSERTION  2012   St Jude    Current Outpatient Prescriptions  Medication Sig Dispense Refill  . AMBULATORY NON FORMULARY MEDICATION Inject 300 mg into the skin as directed. Medication Name: Inclisiran sodium vs placebo    . aspirin 81 MG tablet Take 81 mg by mouth daily.      . carvedilol (COREG) 12.5 MG tablet Take 1 tablet (12.5 mg total) by mouth 2 (two) times daily. 180 tablet 3  . co-enzyme Q-10 30 MG capsule Take 5 capsules (150 mg total) by mouth daily.    Marland Kitchen ezetimibe (ZETIA) 10 MG tablet Take 1 tablet (10 mg total) by mouth daily. 90 tablet 1  . fenofibrate 160 MG tablet Take 1 tablet (160 mg total) by mouth daily. 30 tablet 8  . fish oil-omega-3 fatty acids 1000 MG capsule Take 4 g by  mouth daily.     . nicotine (NICODERM CQ - DOSED IN MG/24 HOURS) 14 mg/24hr patch Place 1 patch (14 mg total) onto the skin daily. 14 patch 0  . nicotine (NICODERM CQ - DOSED IN MG/24 HR) 7 mg/24hr patch Place 1 patch (7 mg total) onto the skin daily. 14 patch 0  . nitroGLYCERIN (NITROSTAT) 0.4 MG SL tablet Place 1 tablet (0.4 mg total) under the tongue every 5 (five) minutes as needed for chest pain. Up to 3 doses 25 tablet 3  . valsartan (DIOVAN) 160 MG tablet Take 1 tablet (160 mg total) by mouth daily. 30 tablet 11   No current facility-administered medications for this visit.     Allergies:   Imdur [isosorbide dinitrate]; Crestor [rosuvastatin]; and Lipitor [atorvastatin]   Social History:   The patient  reports that he quit smoking about 6 years ago. His smoking use included Cigarettes. He has a 34.00 pack-year smoking history. He has never used smokeless tobacco. He reports that he drinks alcohol. He reports that he does not use drugs.   Family History:  The patient's family history includes Aneurysm in his mother.  ROS:  Please see the history of present illness.  All other systems are reviewed and otherwise negative.   PHYSICAL EXAM:  VS:  BP 134/80   Pulse 69   Ht 6\' 2"  (1.88 m)   Wt 239 lb (108.4 kg)   BMI 30.69 kg/m  BMI: Body mass index is 30.69 kg/m. Well nourished, well developed, in no acute distress  HEENT: normocephalic, atraumatic  Neck: no JVD, carotid bruits or masses Cardiac:  RRR; no significant murmurs, no rubs, or gallops Lungs:  CTA b/l, no wheezing, rhonchi or rales  Abd: soft, nontender MS: no deformity or atrophy Ext: no edema  Skin: warm and dry, no rash Neuro:  No gross deficits appreciated Psych: euthymic mood, full affect  ICD site is stable, no tethering or discomfort, no erythema or skin changes are appreciated   EKG:  Done 03/04/16 was SR ICD interrogation done today by industry and reviewed by myself: battery and lead measurements are good, no episodes or observations noted, VP <1%  05/08/15: TTE Study Conclusions - Left ventricle: The cavity size was mildly dilated. Wall   thickness was normal. Systolic function was moderately to   severely reduced. The estimated ejection fraction was in the   range of 30% to 35%. Anteroseptal akinesis, mid to apical   inferoseptal akinesis, apical anterior akinesis, apical inferior   akinesis, akinesis of the true apex. Doppler parameters are   consistent with abnormal left ventricular relaxation (grade 1   diastolic dysfunction). - Aortic valve: There was no stenosis. - Aorta: Mildly dilated aortic root. Aortic root dimension: 38 mm   (ED). - Mitral valve: There was trivial regurgitation. -  Left atrium: The atrium was mildly dilated. - Right ventricle: The cavity size was normal. Pacer wire or   catheter noted in right ventricle. Systolic function was normal. - Tricuspid valve: Peak RV-RA gradient (S): 23 mm Hg. - Pulmonary arteries: PA peak pressure: 31 mm Hg (S). - Systemic veins: IVC measured 2.4 cm with normal respirophasic   variation, suggesting RA pressure 8 mmHg. Impressions: - Mildly dilated LV. EF 30-35%. Wall motion abnormalities as noted   above in the distribution of LAD infarction. Normal RV size and   systolic function. No significant valvular abnormalities.    Recent Labs: 02/14/2016: ALT 23; BUN 14; Creatinine, Ser 0.86; Platelets 250; Potassium  4.5; Sodium 141  07/11/2015: VLDL 29 02/14/2016: Chol/HDL Ratio 3.5; Cholesterol, Total 190; HDL 55; LDL Calculated 109; Triglycerides 128   CrCl cannot be calculated (Patient's most recent lab result is older than the maximum 21 days allowed.).   Wt Readings from Last 3 Encounters:  06/04/16 239 lb (108.4 kg)  04/02/16 241 lb (109.3 kg)  02/21/16 243 lb (110.2 kg)     Other studies reviewed: Additional studies/records reviewed today include: summarized above  ASSESSMENT AND PLAN:  1. ICD     Device is on advisory (patient had received letter), SJM representative has demonstrated today vibratory alert, I have d/w patient that at this time industry recommendation is that the device not be changed out.  Discussed importance of remotes, SJM representative has provided the patient with a new cell adapter and he will make a transmission and call her (she provided him her number) to ensure successful tranmission  2. ICM     No exam evidence or symptoms of fl;uid OL, weight is stable (down a couple pounds from last)     Om BB/ARB  3. CAD     No anginal sounding complaints     On ASA, BB, and is enrolled on [name] lipid trial (following with research providers)  4. HTN     Looks OK, no changes   Disposition:  F/u with 3 month remote and 6 months with Dr.Taylor, sooner if needed.  F/U with Dr. Radford Pax as planned by her.  Current medicines are reviewed at length with the patient today.  The patient did not have any concerns regarding medicines.  Haywood Lasso, PA-C 06/04/2016 9:29 AM     [address] [place] [address] [phone] (office)  [phone] (fax)

## 2016-06-04 ENCOUNTER — Ambulatory Visit (INDEPENDENT_AMBULATORY_CARE_PROVIDER_SITE_OTHER): Payer: Medicare HMO | Admitting: Physician Assistant

## 2016-06-04 VITALS — BP 134/80 | HR 69 | Ht 74.0 in | Wt 239.0 lb

## 2016-06-04 DIAGNOSIS — Z95810 Presence of automatic (implantable) cardiac defibrillator: Secondary | ICD-10-CM | POA: Diagnosis not present

## 2016-06-04 DIAGNOSIS — I255 Ischemic cardiomyopathy: Secondary | ICD-10-CM

## 2016-06-04 DIAGNOSIS — I2510 Atherosclerotic heart disease of native coronary artery without angina pectoris: Secondary | ICD-10-CM | POA: Diagnosis not present

## 2016-06-04 DIAGNOSIS — I10 Essential (primary) hypertension: Secondary | ICD-10-CM

## 2016-06-04 NOTE — Patient Instructions (Addendum)
Medication Instructions:   Your physician recommends that you continue on your current medications as directed. Please refer to the Current Medication list given to you today.   If you need a refill on your cardiac medications before your next appointment, please call your pharmacy.  Labwork: NONE ORDERED  TODAY'   Testing/Procedures: NONE ORDERED  TODAY    Follow-Up: Your physician wants you to follow-up in:  IN [place] will receive a reminder letter in the mail two months in advance. If you don't receive a letter, please call our office to schedule the follow-up appointment.    SEND REMOTE TRANSMISSION  TODAY AND CONTACT LARA FORM [facility] AS DSICUSSED   Any Other Special Instructions Will Be Listed Below (If Applicable).

## 2016-06-17 ENCOUNTER — Telehealth: Payer: Self-pay

## 2016-06-17 ENCOUNTER — Telehealth: Payer: Self-pay | Admitting: *Deleted

## 2016-06-17 NOTE — Telephone Encounter (Signed)
Call from patient stating "I got my LDL shot a few weeks ago and I am so sore, it hurts to stand up." Denies acute distress, just muscle soreness. Will have [employer] call him back shortly; he agrees with plan.

## 2016-06-17 NOTE — Telephone Encounter (Signed)
I called patient back after speaking to Dr Lia Foyer. Dr Lia Foyer will evaluate the patient in the clinic on 06/18/16 at 8:00 am. Patient agreed to be seen.

## 2016-06-17 NOTE — Telephone Encounter (Signed)
Patient called to say he was having soreness all over body.He states it feels like flu symptoms. He has soreness in right hip,right arm,right knee and right ankle. He stated it started about 2 weeks ago but has increased in severity. He was unable to do his activities today and had to stop. I will discuss with Dr.Stuckey and call patient back. Patient did receive second injection of [name] study drug on 06/02/16.

## 2016-06-18 ENCOUNTER — Telehealth: Payer: Self-pay | Admitting: *Deleted

## 2016-06-18 NOTE — Telephone Encounter (Signed)
I called patient today to see if he was coming to be evaluated by Dr. Lia Foyer. He stated he was going to see his primary doctor at [facility]. He apologized because he thought he contacted me to let us know he would not be at the appointment. He stated he felt a little better today.He would see [place] at his next scheduled appointment for the [facility].

## 2016-06-20 DIAGNOSIS — M2550 Pain in unspecified joint: Secondary | ICD-10-CM | POA: Diagnosis not present

## 2016-06-20 DIAGNOSIS — G5702 Lesion of sciatic nerve, left lower limb: Secondary | ICD-10-CM | POA: Diagnosis not present

## 2016-07-04 ENCOUNTER — Telehealth: Payer: Self-pay | Admitting: *Deleted

## 2016-07-04 NOTE — Telephone Encounter (Signed)
I called patient to see how he was doing. He stated he saw his primary doctor and he thought he had a virus. He took a Prednisone packet and stated he felt better.I will see patient on July 23,2018 for next [facility] visit.

## 2016-08-04 ENCOUNTER — Encounter: Payer: Self-pay | Admitting: *Deleted

## 2016-08-04 ENCOUNTER — Encounter: Payer: Self-pay | Admitting: Cardiology

## 2016-08-04 DIAGNOSIS — Z006 Encounter for examination for normal comparison and control in clinical research program: Secondary | ICD-10-CM

## 2016-08-04 NOTE — Progress Notes (Signed)
I saw patient for 150-day [name] study visit. Labs were drawn per protocol. I scheduled patient for 270 day visit on November 13,2018 at 7:30am.

## 2016-08-11 ENCOUNTER — Telehealth: Payer: Self-pay | Admitting: Pharmacist

## 2016-08-11 MED ORDER — LOSARTAN POTASSIUM 50 MG PO TABS: 50.0000 mg | ORAL_TABLET | Freq: Every day | ORAL | 3 refills | Status: DC

## 2016-08-11 NOTE — Telephone Encounter (Signed)
Pt called clinic stating he was affected by the valsartan recall. Will switch valsartan 160mg  daily to equivalent dose of losartan 50mg  daily since pt has HFrEF. Advised pt to monitor his BP over the next few weeks given lower potency of losartan.

## 2016-08-18 ENCOUNTER — Telehealth: Payer: Self-pay

## 2016-08-18 NOTE — Telephone Encounter (Signed)
LATE ENTRY....PATIENT CALLED ME LAST WEEK TO ORDER HIS ZETIA FROM [facility].. I CALLED THEM TODAY AND HIS APPLICATION NEEDED TO BE RENEW. HE ASKED TO TO FILE OUT THE FORM AND PUT IT UP FRONT FOR HIM TO SIGN. I PLACED THE FORM UP FRONT FOR HIM TO SIGN AND GAVE THE FORM TO [place] FOR DR TURNER TO SIGN.Marland KitchenMarland Kitchen

## 2016-09-09 ENCOUNTER — Other Ambulatory Visit: Payer: Self-pay | Admitting: Cardiology

## 2016-09-16 ENCOUNTER — Telehealth: Payer: Self-pay

## 2016-09-16 ENCOUNTER — Ambulatory Visit (INDEPENDENT_AMBULATORY_CARE_PROVIDER_SITE_OTHER): Payer: Medicare HMO | Admitting: *Deleted

## 2016-09-16 DIAGNOSIS — I255 Ischemic cardiomyopathy: Secondary | ICD-10-CM | POA: Diagnosis not present

## 2016-09-16 NOTE — Telephone Encounter (Signed)
PATIENT CALLED ABOUT HIS ZETIA WHICH COMES FROM [facility]. I CALLED [facility] TO FIND OUT  WHEN HIS ZETIA WOULD GET TO HIM. THEY TOLD ME Thursday 09/18/16. I TOLD THE PATIENT WHAT [facility] TOLD ME AND TOLD HIM TO LET ME KNOW IF HE DOESN'T GET IT.

## 2016-09-17 NOTE — Progress Notes (Signed)
Remote ICD transmission.   

## 2016-09-23 ENCOUNTER — Other Ambulatory Visit: Payer: Self-pay | Admitting: Cardiology

## 2016-09-23 ENCOUNTER — Other Ambulatory Visit: Payer: Self-pay

## 2016-09-23 ENCOUNTER — Telehealth: Payer: Self-pay

## 2016-09-23 MED ORDER — EZETIMIBE 10 MG PO TABS: 10.0000 mg | ORAL_TABLET | Freq: Every day | ORAL | 0 refills | Status: DC

## 2016-09-23 NOTE — Telephone Encounter (Signed)
Joe [name] called because he had not receive his zetia from [facility]. I called [facility] and they gave me the tracking number. I typed in the tracking number at [employer]. It state that the package was here in [place] on 09/19/2016. I called Joe [name] to let him know what was going on and I sent him in a RX for zetia DAW, because he can not take generic.

## 2016-09-24 ENCOUNTER — Encounter: Payer: Self-pay | Admitting: Cardiology

## 2016-09-25 ENCOUNTER — Telehealth: Payer: Self-pay | Admitting: *Deleted

## 2016-09-25 NOTE — Telephone Encounter (Signed)
Note   I did a prior authorization through covermymeds for patients [name], it was approved. It looks like patient gets his medication through a patient assistance program, see note below from [facility] 09/23/2016.       Joe [name] called because he had not receive his zetia from [facility]. I called [facility] and they gave me the tracking number. I typed in the tracking number at [employer]. It state that the package was here in [place] on 09/19/2016. I called Joe [name] to let him know what was going on and I sent him in a RX for zetia DAW, because he can not take generic.

## 2016-09-26 LAB — CUP PACEART REMOTE DEVICE CHECK
Battery Remaining Percentage: 51 %
Brady Statistic RV Percent Paced: 1 % — CL
HighPow Impedance: 68 Ohm
Implantable Pulse Generator Implant Date: 20121227
Lead Channel Impedance Value: 460 Ohm
Lead Channel Sensing Intrinsic Amplitude: 11.8 mV
MDC IDC LEAD IMPLANT DT: 20121227
MDC IDC LEAD LOCATION: 753860
MDC IDC MSMT BATTERY REMAINING LONGEVITY: 60 mo
MDC IDC PG SERIAL: 1037513
MDC IDC SESS DTM: 20180914085103

## 2016-10-03 ENCOUNTER — Other Ambulatory Visit: Payer: Self-pay | Admitting: Cardiology

## 2016-10-09 DIAGNOSIS — Z23 Encounter for immunization: Secondary | ICD-10-CM | POA: Diagnosis not present

## 2016-10-17 DIAGNOSIS — N529 Male erectile dysfunction, unspecified: Secondary | ICD-10-CM | POA: Diagnosis not present

## 2016-10-17 DIAGNOSIS — Z0000 Encounter for general adult medical examination without abnormal findings: Secondary | ICD-10-CM | POA: Diagnosis not present

## 2016-11-17 ENCOUNTER — Telehealth: Payer: Self-pay

## 2016-11-17 DIAGNOSIS — J0191 Acute recurrent sinusitis, unspecified: Secondary | ICD-10-CM | POA: Diagnosis not present

## 2016-11-17 NOTE — Telephone Encounter (Signed)
Mr Dorko called me today about a refill on his zetia, I called [facility] and order his zetia Rx # [id number]

## 2016-11-25 ENCOUNTER — Encounter: Payer: Self-pay | Admitting: *Deleted

## 2016-11-25 DIAGNOSIS — Z006 Encounter for examination for normal comparison and control in clinical research program: Secondary | ICD-10-CM

## 2016-11-25 NOTE — Progress Notes (Signed)
Subject to research clinic for V5-Day270 in the [facility] study.  No c/o, aes or saes to report.  Injection given and next follow up appointment scheduled.

## 2016-12-16 ENCOUNTER — Ambulatory Visit (INDEPENDENT_AMBULATORY_CARE_PROVIDER_SITE_OTHER): Payer: Medicare HMO | Admitting: *Deleted

## 2016-12-16 ENCOUNTER — Telehealth: Payer: Self-pay | Admitting: Cardiology

## 2016-12-16 DIAGNOSIS — I255 Ischemic cardiomyopathy: Secondary | ICD-10-CM

## 2016-12-16 NOTE — Telephone Encounter (Signed)
Spoke with pt and reminded pt of remote transmission that is due today. Pt verbalized understanding.   

## 2016-12-17 ENCOUNTER — Encounter: Payer: Self-pay | Admitting: Cardiology

## 2016-12-17 LAB — CUP PACEART REMOTE DEVICE CHECK
Battery Remaining Longevity: 58 mo
Battery Remaining Percentage: 49 %
Brady Statistic RV Percent Paced: 1 %
HIGH POWER IMPEDANCE MEASURED VALUE: 64 Ohm
HIGH POWER IMPEDANCE MEASURED VALUE: 64 Ohm
Implantable Pulse Generator Implant Date: 20121227
Lead Channel Impedance Value: 460 Ohm
Lead Channel Pacing Threshold Pulse Width: 0.5 ms
Lead Channel Sensing Intrinsic Amplitude: 11.8 mV
Lead Channel Setting Pacing Amplitude: 2.5 V
Lead Channel Setting Pacing Pulse Width: 0.5 ms
MDC IDC LEAD IMPLANT DT: 20121227
MDC IDC LEAD LOCATION: 753860
MDC IDC MSMT BATTERY VOLTAGE: 2.93 V
MDC IDC MSMT LEADCHNL RV PACING THRESHOLD AMPLITUDE: 1 V
MDC IDC PG SERIAL: 1037513
MDC IDC SESS DTM: 20181204195445
MDC IDC SET LEADCHNL RV SENSING SENSITIVITY: 0.5 mV

## 2016-12-17 NOTE — Progress Notes (Signed)
Remote ICD transmission.   

## 2016-12-26 ENCOUNTER — Encounter: Payer: Medicare HMO | Admitting: Internal Medicine

## 2017-01-05 ENCOUNTER — Ambulatory Visit: Payer: Medicare HMO | Admitting: Internal Medicine

## 2017-01-05 ENCOUNTER — Encounter: Payer: Self-pay | Admitting: Internal Medicine

## 2017-01-05 ENCOUNTER — Encounter (INDEPENDENT_AMBULATORY_CARE_PROVIDER_SITE_OTHER): Payer: Self-pay

## 2017-01-05 VITALS — BP 140/90 | HR 70 | Ht 74.0 in | Wt 233.8 lb

## 2017-01-05 DIAGNOSIS — I255 Ischemic cardiomyopathy: Secondary | ICD-10-CM | POA: Diagnosis not present

## 2017-01-05 DIAGNOSIS — I10 Essential (primary) hypertension: Secondary | ICD-10-CM

## 2017-01-05 DIAGNOSIS — Z95810 Presence of automatic (implantable) cardiac defibrillator: Secondary | ICD-10-CM

## 2017-01-05 LAB — CUP PACEART INCLINIC DEVICE CHECK
Implantable Lead Implant Date: 20121227
Implantable Pulse Generator Implant Date: 20121227
MDC IDC LEAD LOCATION: 753860
MDC IDC SESS DTM: 20181224084627
Pulse Gen Serial Number: 1037513

## 2017-01-05 NOTE — Progress Notes (Signed)
HPI Joe [name] returns today for ongoing evaluation and management of chronic systolic heart failure, HTN, s/p ICD insertion. He has done well in the interim with no chest pain or sob. No ICD shocks.  Allergies  Allergen Reactions  . Imdur [Isosorbide Dinitrate] Other (See Comments)    headache   . Crestor [Rosuvastatin]     Muscle aches on 5 mg qd and 20 mg qd  . Lipitor [Atorvastatin]     Made his irritable and anxious     Current Outpatient Medications  Medication Sig Dispense Refill  . AMBULATORY NON FORMULARY MEDICATION Inject 300 mg into the skin as directed. Medication Name: Inclisiran sodium vs placebo    . aspirin 81 MG tablet Take 81 mg by mouth daily.      . carvedilol (COREG) 12.5 MG tablet Take 1 tablet (12.5 mg total) by mouth 2 (two) times daily. 180 tablet 3  . co-enzyme Q-10 30 MG capsule Take 5 capsules (150 mg total) by mouth daily.    Marland Kitchen ezetimibe (ZETIA) 10 MG tablet Take 1 tablet (10 mg total) by mouth daily. 10 tablet 0  . fenofibrate 160 MG tablet Take 1 tablet (160 mg total) by mouth daily. 30 tablet 8  . fish oil-omega-3 fatty acids 1000 MG capsule Take 4 g by mouth daily.     . nitroGLYCERIN (NITROSTAT) 0.4 MG SL tablet Place 1 tablet (0.4 mg total) under the tongue every 5 (five) minutes as needed for chest pain. Up to 3 doses 25 tablet 3  . losartan (COZAAR) 50 MG tablet Take 1 tablet (50 mg total) by mouth daily. 90 tablet 3   No current facility-administered medications for this visit.      Past Medical History:  Diagnosis Date  . Chronic systolic CHF (congestive heart failure), NYHA class 1 (HCC)    EF 37% by nuclear stress test  . Coronary artery disease    s/p PCI mild LAD (BMS) in setting of AWMI complicated by Vfib arrest with 40-50% residual disesae in left circ  . Hyperlipidemia   . Ischemic cardiomyopathy    A. 01/09/2011 - s/p St. Jude Fortify ST VR [place] AICD  . OSA (obstructive sleep apnea)    severe with AHI 26/hr now on CPAP  at [facility]  . VF (ventricular fibrillation) ([place])    arrest in setting of AMI 6/11    ROS:   All systems reviewed and negative except as noted in the HPI.   Past Surgical History:  Procedure Laterality Date  . BACK SURGERY    . BACK SURGERY    . IMPLANTABLE CARDIOVERTER DEFIBRILLATOR IMPLANT N/A 01/09/2011   Procedure: IMPLANTABLE CARDIOVERTER DEFIBRILLATOR IMPLANT;  Surgeon: Evans Lance, MD;  Location: [facility] CATH LAB;  Service: Cardiovascular;  Laterality: N/A;  . KNEE ARTHROSCOPY     right  . PACEMAKER INSERTION  2012   St Jude     Family History  Problem Relation Age of Onset  . Aneurysm Mother      Social History   Socioeconomic History  . Marital status: Single    Spouse name: Not on file  . Number of children: Not on file  . Years of education: Not on file  . Highest education level: Not on file  Social Needs  . Financial resource strain: Not on file  . Food insecurity - worry: Not on file  . Food insecurity - inability: Not on file  . Transportation needs - medical: Not on  file  . Transportation needs - non-medical: Not on file  Occupational History  . Not on file  Tobacco Use  . Smoking status: Former Smoker    Packs/day: 1.00    Years: 34.00    Pack years: 34.00    Types: Cigarettes    Last attempt to quit: 06/13/2009    Years since quitting: 7.5  . Smokeless tobacco: Never Used  . Tobacco comment: Socially  Substance and Sexual Activity  . Alcohol use: Yes    Alcohol/week: 0.0 oz    Comment: occasional  . Drug use: No  . Sexual activity: Yes  Other Topics Concern  . Not on file  Social History Narrative  . Not on file     BP 140/90   Pulse 70   Ht 6\' 2"  (1.88 m)   Wt 233 lb 12.8 oz (106.1 kg)   BMI 30.02 kg/m   Physical Exam:  Well appearing NAD HEENT: Unremarkable Neck:  No JVD, no thyromegally Lymphatics:  No adenopathy Back:  No CVA tenderness Lungs:  Clear HEART:  Regular rate rhythm, no murmurs, no rubs, no clicks Abd:   soft, positive bowel sounds, no organomegally, no rebound, no guarding Ext:  2 plus pulses, no edema, no cyanosis, no clubbing Skin:  No rashes no nodules Neuro:  CN II through XII intact, motor grossly intact  DEVICE  Normal device function.  See PaceArt for details.   Assess/Plan: 1. ICD - his St. Jude device is working normally. Will recheck in several months.  2. ICM - he remains active. He denies anginal symptoms.  3. Chronic systolic heart failure - his symptoms are class 2. He will continue his current meds.  [name] [name],MD

## 2017-01-05 NOTE — Patient Instructions (Signed)

## 2017-01-27 ENCOUNTER — Encounter: Payer: Self-pay | Admitting: *Deleted

## 2017-01-27 DIAGNOSIS — Z006 Encounter for examination for normal comparison and control in clinical research program: Secondary | ICD-10-CM

## 2017-01-27 NOTE — Progress Notes (Signed)
Subject to research clinic for [phone] in the [place] 10 study.  No c/o, aes or saes to report.  Next appointment, injection scheduled.

## 2017-02-12 ENCOUNTER — Other Ambulatory Visit: Payer: Self-pay | Admitting: Cardiology

## 2017-02-12 ENCOUNTER — Other Ambulatory Visit: Payer: Self-pay | Admitting: Physician Assistant

## 2017-02-13 ENCOUNTER — Other Ambulatory Visit: Payer: Self-pay | Admitting: Physician Assistant

## 2017-02-19 ENCOUNTER — Telehealth: Payer: Self-pay

## 2017-02-19 NOTE — Telephone Encounter (Signed)
Patient called to get a refill on his Zetia, So I called [facility] to request a refill his zetia. Application ex [id number]

## 2017-03-17 ENCOUNTER — Telehealth: Payer: Self-pay | Admitting: Cardiology

## 2017-03-17 ENCOUNTER — Ambulatory Visit (INDEPENDENT_AMBULATORY_CARE_PROVIDER_SITE_OTHER): Payer: Medicare HMO | Admitting: *Deleted

## 2017-03-17 DIAGNOSIS — I255 Ischemic cardiomyopathy: Secondary | ICD-10-CM

## 2017-03-17 NOTE — Telephone Encounter (Signed)
Spoke with pt and reminded pt of remote transmission that is due today. Pt verbalized understanding.   

## 2017-03-18 ENCOUNTER — Encounter: Payer: Self-pay | Admitting: Cardiology

## 2017-03-18 NOTE — Progress Notes (Signed)
Remote ICD transmission.   

## 2017-03-22 ENCOUNTER — Other Ambulatory Visit: Payer: Self-pay | Admitting: Physician Assistant

## 2017-03-22 LAB — CUP PACEART REMOTE DEVICE CHECK
Date Time Interrogation Session: 20190310112851
Implantable Lead Location: 753860
MDC IDC LEAD IMPLANT DT: 20121227
MDC IDC PG IMPLANT DT: 20121227
Pulse Gen Serial Number: 1037513

## 2017-03-23 NOTE — Telephone Encounter (Signed)
Medication Detail    Disp Refills Start End   carvedilol (COREG) 12.5 MG tablet 180 tablet 3 02/13/2017    Sig: TAKE 1 TABLET BY MOUTH TWICE DAILY WITH MEALS   Sent to pharmacy as: carvedilol (COREG) 12.5 MG tablet   Notes to Pharmacy: **Patient requests 90 days supply**   E-Prescribing Status: Receipt confirmed by pharmacy (02/13/2017 4:52 PM EST)   Pharmacy   [place] [id number] - [address] AT [address]

## 2017-05-14 ENCOUNTER — Telehealth: Payer: Self-pay

## 2017-05-14 NOTE — Telephone Encounter (Signed)
  CALLED IN FOR A REFILL ON MR Theoplis'S ZETIA AT [facility]

## 2017-05-26 ENCOUNTER — Encounter: Payer: Medicare HMO | Admitting: *Deleted

## 2017-05-26 VITALS — BP 154/88 | HR 76 | Temp 98.3°F | Resp 20 | Wt 232.8 lb

## 2017-05-26 DIAGNOSIS — Z006 Encounter for examination for normal comparison and control in clinical research program: Secondary | ICD-10-CM

## 2017-05-26 NOTE — Progress Notes (Signed)
Subject to research clinic for V7-D450 in the [place] study.  No c/o, aes or saes to report.  Injection given and subject observed in clinic for 30 minutes per protocol.  Next clinic visit scheduled.

## 2017-06-16 ENCOUNTER — Telehealth: Payer: Self-pay | Admitting: Cardiology

## 2017-06-16 ENCOUNTER — Ambulatory Visit (INDEPENDENT_AMBULATORY_CARE_PROVIDER_SITE_OTHER): Payer: Medicare HMO | Admitting: *Deleted

## 2017-06-16 DIAGNOSIS — I255 Ischemic cardiomyopathy: Secondary | ICD-10-CM

## 2017-06-16 NOTE — Telephone Encounter (Signed)
LMOVM reminding pt to send remote transmission.   

## 2017-06-18 ENCOUNTER — Encounter: Payer: Self-pay | Admitting: Cardiology

## 2017-06-18 NOTE — Progress Notes (Signed)
Letter  

## 2017-06-19 ENCOUNTER — Encounter: Payer: Self-pay | Admitting: Cardiology

## 2017-06-19 NOTE — Progress Notes (Signed)
Remote ICD transmission.   

## 2017-06-20 ENCOUNTER — Other Ambulatory Visit: Payer: Self-pay | Admitting: Cardiology

## 2017-07-03 ENCOUNTER — Ambulatory Visit (HOSPITAL_BASED_OUTPATIENT_CLINIC_OR_DEPARTMENT_OTHER)
Admission: RE | Admit: 2017-07-03 | Discharge: 2017-07-03 | Disposition: A | Discharge status: Home / Self Care | Payer: Medicare HMO | Source: Ambulatory Visit | Attending: Physician Assistant | Admitting: Physician Assistant

## 2017-07-03 ENCOUNTER — Other Ambulatory Visit (HOSPITAL_BASED_OUTPATIENT_CLINIC_OR_DEPARTMENT_OTHER): Payer: Self-pay | Admitting: Physician Assistant

## 2017-07-03 DIAGNOSIS — K760 Fatty (change of) liver, not elsewhere classified: Secondary | ICD-10-CM | POA: Diagnosis not present

## 2017-07-03 DIAGNOSIS — R300 Dysuria: Secondary | ICD-10-CM | POA: Diagnosis not present

## 2017-07-03 DIAGNOSIS — K429 Umbilical hernia without obstruction or gangrene: Secondary | ICD-10-CM | POA: Insufficient documentation

## 2017-07-03 DIAGNOSIS — K8020 Calculus of gallbladder without cholecystitis without obstruction: Secondary | ICD-10-CM | POA: Insufficient documentation

## 2017-07-03 DIAGNOSIS — N132 Hydronephrosis with renal and ureteral calculous obstruction: Secondary | ICD-10-CM | POA: Diagnosis not present

## 2017-07-03 DIAGNOSIS — R109 Unspecified abdominal pain: Secondary | ICD-10-CM | POA: Diagnosis not present

## 2017-07-22 ENCOUNTER — Encounter: Payer: Medicare HMO | Admitting: *Deleted

## 2017-07-22 DIAGNOSIS — Z006 Encounter for examination for normal comparison and control in clinical research program: Secondary | ICD-10-CM

## 2017-07-22 NOTE — Progress Notes (Signed)
Subject to research clinic for V8D510 in the [place] 10 study. No cos, aes or saes to report.  Next clinic visit scheduled and spoke with subject re: [name] 8.

## 2017-08-03 ENCOUNTER — Other Ambulatory Visit: Payer: Self-pay | Admitting: Physician Assistant

## 2017-08-06 LAB — CUP PACEART REMOTE DEVICE CHECK
Implantable Lead Implant Date: 20121227
Implantable Lead Location: 753860
Implantable Lead Model: 7122
Implantable Pulse Generator Implant Date: 20121227
MDC IDC SESS DTM: 20190725213948
Pulse Gen Serial Number: 1037513

## 2017-08-17 DIAGNOSIS — E669 Obesity, unspecified: Secondary | ICD-10-CM | POA: Diagnosis not present

## 2017-08-17 DIAGNOSIS — D122 Benign neoplasm of ascending colon: Secondary | ICD-10-CM | POA: Diagnosis not present

## 2017-08-17 DIAGNOSIS — Z1211 Encounter for screening for malignant neoplasm of colon: Secondary | ICD-10-CM | POA: Diagnosis not present

## 2017-08-17 DIAGNOSIS — Z6829 Body mass index (BMI) 29.0-29.9, adult: Secondary | ICD-10-CM | POA: Diagnosis not present

## 2017-08-17 DIAGNOSIS — K5730 Diverticulosis of large intestine without perforation or abscess without bleeding: Secondary | ICD-10-CM | POA: Diagnosis not present

## 2017-08-17 DIAGNOSIS — I252 Old myocardial infarction: Secondary | ICD-10-CM | POA: Diagnosis not present

## 2017-08-17 DIAGNOSIS — I110 Hypertensive heart disease with heart failure: Secondary | ICD-10-CM | POA: Diagnosis not present

## 2017-08-17 DIAGNOSIS — I509 Heart failure, unspecified: Secondary | ICD-10-CM | POA: Diagnosis not present

## 2017-08-17 DIAGNOSIS — I2510 Atherosclerotic heart disease of native coronary artery without angina pectoris: Secondary | ICD-10-CM | POA: Diagnosis not present

## 2017-09-04 ENCOUNTER — Telehealth: Payer: Self-pay

## 2017-09-04 NOTE — Telephone Encounter (Signed)
Later entry...........[name] called me to get a refill on his zetia, [facility] and his application had expired, so I called him back and told him that his application had expired. He ask me to fill out the application and place it at the front desk. I did fill out the form and placed it up front for him to sign it. The form was return to me and I gave them to [place] to get Dr Radford Pax to sign it. Patient stated that he had a week of pills left.

## 2017-09-09 ENCOUNTER — Other Ambulatory Visit: Payer: Self-pay

## 2017-09-09 VITALS — BP 136/89 | HR 56 | Resp 20 | Ht 74.0 in | Wt 232.0 lb

## 2017-09-09 DIAGNOSIS — Z006 Encounter for examination for normal comparison and control in clinical research program: Secondary | ICD-10-CM

## 2017-09-09 NOTE — Telephone Encounter (Signed)
**Note De-Identified [place] Obfuscation** Dr Radford Pax has signed the pts [employer] Pt Asst program enrollment form for Zetia. I have mailed it to [employer] Pt [employer] program.

## 2017-09-10 NOTE — Progress Notes (Signed)
Late Entry:  Joe [name] to research clinic for visit V9EOS in the [place] 10 study.  No complaints, adverse events, or serious adverse events to report.     Subject met inclusion and exclusion criteria. The informed consent form, study requirements and expectations were reviewed with the subject and questions and concerns were addressed prior to the signing of the consent form.  The subject verbalized understanding of the trial requirements.  The subject agreed to participate in the [place] 8  trial and signed the informed consent.  The informed consent was obtained prior to performance of any protocol-specific procedures for the subject.  A copy of the signed informed consent was given to the subject and a copy was placed in the subject's medical record.   Study Entry-Day1 in [place] 8 study. Subject given injection and observed in clinic for 30 minutes per protocol.  Next appointment scheduled

## 2017-09-15 ENCOUNTER — Encounter: Payer: Medicare HMO | Admitting: *Deleted

## 2017-09-15 ENCOUNTER — Telehealth: Payer: Self-pay | Admitting: Cardiology

## 2017-09-15 NOTE — Telephone Encounter (Signed)
Spoke with pt and reminded pt of remote transmission that is due today. Pt verbalized understanding.   

## 2017-09-18 ENCOUNTER — Encounter: Payer: Self-pay | Admitting: Cardiology

## 2017-09-21 ENCOUNTER — Other Ambulatory Visit: Payer: Self-pay | Admitting: *Deleted

## 2017-09-21 ENCOUNTER — Telehealth: Payer: Self-pay | Admitting: Cardiology

## 2017-09-21 ENCOUNTER — Ambulatory Visit (INDEPENDENT_AMBULATORY_CARE_PROVIDER_SITE_OTHER): Payer: Medicare HMO | Admitting: *Deleted

## 2017-09-21 DIAGNOSIS — I255 Ischemic cardiomyopathy: Secondary | ICD-10-CM

## 2017-09-21 NOTE — Telephone Encounter (Signed)
° ° ° °*  STAT* If patient is at the pharmacy, call can be transferred to refill team.   1. Which medications need to be refilled? (please list name of each medication and dose if known) ezetimibe (ZETIA) 10 MG tablet  2. Which pharmacy/location (including street and city if local pharmacy) is medication to be sent [facility]  3. Do they need a 30 day or 90 day supply? [place]

## 2017-09-21 NOTE — Progress Notes (Signed)
Remote ICD transmission.   

## 2017-09-21 NOTE — Telephone Encounter (Signed)
After reviewing patients chart, it appears that [facility].

## 2017-09-22 ENCOUNTER — Encounter: Payer: Self-pay | Admitting: Cardiology

## 2017-09-22 ENCOUNTER — Telehealth: Payer: Self-pay

## 2017-09-22 NOTE — Telephone Encounter (Signed)
Called in 10 tablets of his zetia, but needed a prior auth done. So I called [employer] and did a prior aut. Reference number is [id number]

## 2017-09-22 NOTE — Telephone Encounter (Signed)
Joe [name] called me about his application for his zetia, I told him that I would call [facility] to check on his application. I called [facility] and they said that the application was sent back on 09/17/17, because the doctor's state license number was not on the application and that the patient had drug coverage, and that addition forms had to be filled out and sent back in. I called Joe [name] to let him know what [facility] said. He asked me to call him 10 tablets in to the [facility] in [place]. So I called in 10 tablets until his application can be fix and mailed back in.

## 2017-09-30 ENCOUNTER — Telehealth: Payer: Self-pay | Admitting: Cardiology

## 2017-09-30 NOTE — Telephone Encounter (Signed)
Spoke w/ pt and requested that he send a manual transmission b/c his home monitor has not updated in at least 7 days.   

## 2017-10-08 ENCOUNTER — Telehealth: Payer: Self-pay

## 2017-10-08 DIAGNOSIS — B880 Other acariasis: Secondary | ICD-10-CM | POA: Diagnosis not present

## 2017-10-08 DIAGNOSIS — N529 Male erectile dysfunction, unspecified: Secondary | ICD-10-CM | POA: Diagnosis not present

## 2017-10-08 NOTE — Telephone Encounter (Signed)
[place] me about his zetia, he had not received it yet so I called the [employer] and his application was approved on 10/06/17. I also called the [facility] to see when his zetia will be sent out and they needed a new script so I gave a verbal order for zetia 10 mg one tablet once a day with 90 tablets and 4 refill

## 2017-10-13 LAB — CUP PACEART REMOTE DEVICE CHECK
Implantable Lead Location: 753860
Implantable Lead Model: 7122
MDC IDC LEAD IMPLANT DT: 20121227
MDC IDC PG IMPLANT DT: 20121227
MDC IDC SESS DTM: 20191001104708
Pulse Gen Serial Number: 1037513

## 2017-10-14 DIAGNOSIS — Z23 Encounter for immunization: Secondary | ICD-10-CM | POA: Diagnosis not present

## 2017-10-20 ENCOUNTER — Encounter: Payer: Self-pay | Admitting: Cardiology

## 2017-11-05 ENCOUNTER — Ambulatory Visit: Payer: Medicare HMO | Admitting: Cardiology

## 2017-11-05 DIAGNOSIS — R0989 Other specified symptoms and signs involving the circulatory and respiratory systems: Secondary | ICD-10-CM

## 2017-11-06 ENCOUNTER — Telehealth: Payer: Self-pay

## 2017-11-06 ENCOUNTER — Encounter: Payer: Self-pay | Admitting: Cardiology

## 2017-11-06 NOTE — Telephone Encounter (Signed)
LMOVM requesting that pt send manual transmission b/c home monitor has not updated in at least 7 days.    

## 2017-11-30 ENCOUNTER — Ambulatory Visit: Payer: Medicare HMO | Admitting: Cardiology

## 2017-12-08 ENCOUNTER — Encounter: Payer: Medicare HMO | Admitting: *Deleted

## 2017-12-08 VITALS — BP 148/84 | HR 79 | Temp 98.6°F | Resp 20 | Wt 246.0 lb

## 2017-12-08 DIAGNOSIS — Z006 Encounter for examination for normal comparison and control in clinical research program: Secondary | ICD-10-CM

## 2017-12-08 NOTE — Research (Signed)
Subject to research clinic for visit 2 day 90 in the [employer] study.  No cos, aes or saes to report.  Injection given and patient remained in clinic for 30 minutes following injection per protocol.  Next visit scheduled.

## 2017-12-24 ENCOUNTER — Telehealth: Payer: Self-pay | Admitting: Cardiology

## 2017-12-24 NOTE — Telephone Encounter (Signed)
LMOVM requesting that pt send manual transmission b/c home monitor has not updated in at least 7 days.    

## 2017-12-25 ENCOUNTER — Encounter: Payer: Self-pay | Admitting: Cardiology

## 2017-12-25 ENCOUNTER — Telehealth: Payer: Self-pay | Admitting: Cardiology

## 2017-12-25 ENCOUNTER — Other Ambulatory Visit: Payer: Self-pay | Admitting: Internal Medicine

## 2017-12-25 MED ORDER — FENOFIBRATE 160 MG PO TABS: ORAL_TABLET | ORAL | 1 refills | Status: DC

## 2017-12-25 NOTE — Telephone Encounter (Signed)
New Message    *STAT* If patient is at the pharmacy, call can be transferred to refill team.   1. Which medications need to be refilled? (please list name of each medication and dose if known) fenofibrate 160 MG tablet  2. Which pharmacy/location (including street and city if local pharmacy) is medication to be sent to? [place], [place] AT [place]  3. Do they need a 30 day or 90 day supply?

## 2017-12-30 ENCOUNTER — Ambulatory Visit (INDEPENDENT_AMBULATORY_CARE_PROVIDER_SITE_OTHER): Payer: Medicare HMO

## 2017-12-30 DIAGNOSIS — I255 Ischemic cardiomyopathy: Secondary | ICD-10-CM | POA: Diagnosis not present

## 2017-12-30 NOTE — Progress Notes (Signed)
Remote ICD transmission.   

## 2017-12-31 ENCOUNTER — Encounter: Payer: Self-pay | Admitting: Cardiology

## 2017-12-31 ENCOUNTER — Telehealth: Payer: Self-pay

## 2017-12-31 NOTE — Telephone Encounter (Signed)
Mr Mengel called me to request a refill on his zetia. I [facility] and order his zetia, they said that it would arrive with in 7-10 business days

## 2018-01-19 DIAGNOSIS — Z720 Tobacco use: Secondary | ICD-10-CM | POA: Diagnosis not present

## 2018-01-19 DIAGNOSIS — M25511 Pain in right shoulder: Secondary | ICD-10-CM | POA: Diagnosis not present

## 2018-01-26 ENCOUNTER — Encounter: Payer: Self-pay | Admitting: Cardiology

## 2018-01-26 ENCOUNTER — Ambulatory Visit (INDEPENDENT_AMBULATORY_CARE_PROVIDER_SITE_OTHER): Payer: Medicare HMO | Admitting: Medical

## 2018-01-26 VITALS — BP 132/74 | HR 79 | Ht 73.5 in | Wt 241.8 lb

## 2018-01-26 DIAGNOSIS — I255 Ischemic cardiomyopathy: Secondary | ICD-10-CM

## 2018-01-26 DIAGNOSIS — I10 Essential (primary) hypertension: Secondary | ICD-10-CM

## 2018-01-26 DIAGNOSIS — E785 Hyperlipidemia, unspecified: Secondary | ICD-10-CM | POA: Diagnosis not present

## 2018-01-26 DIAGNOSIS — Z95810 Presence of automatic (implantable) cardiac defibrillator: Secondary | ICD-10-CM | POA: Diagnosis not present

## 2018-01-26 DIAGNOSIS — Z720 Tobacco use: Secondary | ICD-10-CM

## 2018-01-26 DIAGNOSIS — I2510 Atherosclerotic heart disease of native coronary artery without angina pectoris: Secondary | ICD-10-CM | POA: Diagnosis not present

## 2018-01-26 MED ORDER — FENOFIBRATE 160 MG PO TABS: ORAL_TABLET | ORAL | 3 refills | Status: DC

## 2018-01-26 NOTE — Patient Instructions (Signed)
Medication Instructions:  NONE If you need a refill on your cardiac medications before your next appointment, please call your pharmacy.   Lab work: NONE If you have labs (blood work) drawn today and your tests are completely normal, you will receive your results only by: Marland Kitchen MyChart Message (if you have MyChart) OR . A paper copy in the mail If you have any lab test that is abnormal or we need to change your treatment, we will call you to review the results.  Testing/Procedures: NONE  Follow-Up: At [facility], you and your health needs are our priority.  As part of our continuing mission to provide you with exceptional heart care, we have created designated Provider Care Teams.  These Care Teams include your primary Cardiologist (physician) and Advanced Practice Providers (APPs -  Physician Assistants and Nurse Practitioners) who all work together to provide you with the care you need, when you need it. You will need a follow up appointment in 6 months.  Please call our office 2 months in advance to schedule this appointment.  You may see Fransico Him, MD or one of the following Advanced Practice Providers on your designated Care Team:   [place], PA-C Melina Copa, PA-C . Ermalinda Barrios, PA-C  Any Other Special Instructions Will Be Listed Below (If Applicable).  Coping with Quitting Smoking  Quitting smoking is a physical and mental challenge. You will face cravings, withdrawal symptoms, and temptation. Before quitting, work with your health care provider to make a plan that can help you cope. Preparation can help you quit and keep you from giving in. How can I cope with cravings? Cravings usually last for 5-10 minutes. If you get through it, the craving will pass. Consider taking the following actions to help you cope with cravings:  Keep your mouth busy: ? Chew sugar-free gum. ? Suck on hard candies or a straw. ? Brush your teeth.  Keep your hands and body  busy: ? Immediately change to a different activity when you feel a craving. ? Squeeze or play with a ball. ? Do an activity or a hobby, like making bead jewelry, practicing needlepoint, or working with wood. ? Mix up your normal routine. ? Take a short exercise break. Go for a quick walk or run up and down stairs. ? Spend time in public places where smoking is not allowed.  Focus on doing something kind or helpful for someone else.  Call a friend or family member to talk during a craving.  Join a support group.  Call a quit line, such as 1-800-QUIT-NOW.  Talk with your health care provider about medicines that might help you cope with cravings and make quitting easier for you. How can I deal with withdrawal symptoms? Your body may experience negative effects as it tries to get used to not having nicotine in the system. These effects are called withdrawal symptoms. They may include:  Feeling hungrier than normal.  Trouble concentrating.  Irritability.  Trouble sleeping.  Feeling depressed.  Restlessness and agitation.  Craving a cigarette. To manage withdrawal symptoms:  Avoid places, people, and activities that trigger your cravings.  Remember why you want to quit.  Get plenty of sleep.  Avoid coffee and other caffeinated drinks. These may worsen some of your symptoms. How can I handle social situations? Social situations can be difficult when you are quitting smoking, especially in the first few weeks. To manage this, you can:  Avoid parties, bars, and other social situations where  people might be smoking.  Avoid alcohol.  Leave right away if you have the urge to smoke.  Explain to your family and friends that you are quitting smoking. Ask for understanding and support.  Plan activities with friends or family where smoking is not an option. What are some ways I can cope with stress? Wanting to smoke may cause stress, and stress can make you want to smoke. Find  ways to manage your stress. Relaxation techniques can help. For example:  Breathe slowly and deeply, in through your nose and out through your mouth.  Listen to soothing, relaxing music.  Talk with a family member or friend about your stress.  Light a candle.  Soak in a bath or take a shower.  Think about a peaceful place. What are some ways I can prevent weight gain? Be aware that many people gain weight after they quit smoking. However, not everyone does. To keep from gaining weight, have a plan in place before you quit and stick to the plan after you quit. Your plan should include:  Having healthy snacks. When you have a craving, it may help to: ? Eat plain popcorn, crunchy carrots, celery, or other cut vegetables. ? Chew sugar-free gum.  Changing how you eat: ? Eat small portion sizes at meals. ? Eat 4-6 small meals throughout the day instead of 1-2 large meals a day. ? Be mindful when you eat. Do not watch television or do other things that might distract you as you eat.  Exercising regularly: ? Make time to exercise each day. If you do not have time for a long workout, do short bouts of exercise for 5-10 minutes several times a day. ? Do some form of strengthening exercise, like weight lifting, and some form of aerobic exercise, like running or swimming.  Drinking plenty of water or other low-calorie or no-calorie drinks. Drink 6-8 glasses of water daily, or as much as instructed by your health care provider. Summary  Quitting smoking is a physical and mental challenge. You will face cravings, withdrawal symptoms, and temptation to smoke again. Preparation can help you as you go through these challenges.  You can cope with cravings by keeping your mouth busy (such as by chewing gum), keeping your body and hands busy, and making calls to family, friends, or a helpline for people who want to quit smoking.  You can cope with withdrawal symptoms by avoiding places where people  smoke, avoiding drinks with caffeine, and getting plenty of rest.  Ask your health care provider about the different ways to prevent weight gain, avoid stress, and handle social situations. This information is not intended to replace advice given to you by your health care provider. Make sure you discuss any questions you have with your health care provider. Document Released: 12/28/2015 Document Revised: 12/28/2015 Document Reviewed: 12/28/2015 Elsevier Interactive Patient Education  2019 [employer].

## 2018-01-26 NOTE — Progress Notes (Signed)
Cardiology Office Note   Date:  01/26/2018   ID:  Joe [name], DOB Oct 11, 1955, MRN [id number]  PCP:  Joe Deutscher, MD  Cardiologist:  Fransico Him, MD EP: Cristopher Peru, MD  Chief Complaint  Patient presents with  . Follow-up    CAD      History of Present Illness: Joe [name] is a 63 y.o. male with PMH of CAD s/p anterior wall MI in 2011 treated with BMS to LAD c/b Vfib arrest (residual moderate circumflex disease noted on cath), ischemic cardiomyopathy with EF 30-35% s/p ICD placement, HTN, HLD, tobacco abuse, and OSA on CPAP, who presents for routine follow-up of CAD and CHF.   He follows outpatient with Dr. Radford [name] and Dr. Lovena [name] for management of his CAD and ischemic cardiomyopathy s/p ICD. He has not been seen by either provider in the past year. His last ischemic evaluation was a NST in 2013 which revealed minimal perfusion defects c/w prior infarct/scar, with EF 37%. Echo in 2017 with EF 30-35% and wall motion abnormalities in the distribution of the LAD territory. His last ICD check 09/2017 showed normal function and stable battery.   He presents today for routine follow-up. He reports doing well from a cardiac standpoint since he was last seen. He reports some anxiety over the past couple weeks related to relationship problems. Also with some right shoulder pain, for which he has been referred to an orthopedist for further evaluation. He continues to stay active with hunting and fishing. No complaints of chest pain at rest or with exertion, SOB, DOE, [name], dizziness, lightheadedness, syncope, orthopnea, PND, or fatigue. He continues to smoke 1/2-1ppd and understands health risks associated with smoking. He has tried patches and chantix in the past without much benefit. He is hopeful to quit cold [place].     Past Medical History:  Diagnosis Date  . Chronic systolic CHF (congestive heart failure), NYHA class 1 (HCC)    EF 37% by nuclear stress test  .  Coronary artery disease    s/p PCI mild LAD (BMS) in setting of AWMI complicated by Vfib arrest with 40-50% residual disesae in left circ  . Hyperlipidemia   . Ischemic cardiomyopathy    A. 01/09/2011 - s/p St[employer] Jude Fortify ST [employer] [place] AICD  . OSA (obstructive sleep apnea)    severe with AHI 26/hr now on CPAP at [facility]  . VF (ventricular fibrillation) ([place])    arrest in setting of AMI 6/11    Past Surgical History:  Procedure Laterality Date  . BACK SURGERY    . BACK SURGERY    . IMPLANTABLE CARDIOVERTER DEFIBRILLATOR IMPLANT N/A 01/09/2011   Procedure: IMPLANTABLE CARDIOVERTER DEFIBRILLATOR IMPLANT;  Surgeon: Evans Lance, MD;  Location[facility] C[facility] LAB;  Service: Cardiovascular;  Laterality: N/A;  . KNEE ARTHROSCOPY     right  . PACEMAKER INSERTION  2012   St Jude     Current Outpatient Medications  Medication Sig Dispense Refill  . AMBULATORY NON FORMULARY MEDICATION Inject 300 mg into the skin as directed. Medication Name: Inclisiran sodium vs placebo    . aspirin 81 MG tablet Take 81 mg by mouth daily.      . carvedilol (COREG) 12.5 MG tablet TAKE 1 TABLET BY MOUTH TWICE DAILY WITH MEALS 180 tablet 3  . co-enzyme Q-10 30 MG capsule Take 5 capsules (150 mg total) by mouth daily.    Marland Kitchen ezetimibe (ZETIA) 10 MG tablet Take 1 tablet (10 mg total)  by mouth daily. 10 tablet 0  . fenofibrate 160 MG tablet TAKE 1 TABLET(160 MG) BY MOUTH DAILY 90 tablet 3  . fish oil-omega-3 fatty acids 1000 MG capsule Take 4 g by mouth daily.     Marland Kitchen losartan (COZAAR) 50 MG tablet TAKE 1 TABLET(50 MG) BY MOUTH DAILY 90 tablet 1  . nitroGLYCERIN (NITROSTAT) 0.4 MG SL tablet Place 1 tablet (0.4 mg total) under the tongue every 5 (five) minutes as needed for chest pain. Up to 3 doses 25 tablet 3   No current facility-administered medications for this visit.     Allergies:   Imdur [isosorbide dinitrate]; Crestor [rosuvastatin]; and Lipitor [atorvastatin]    Social History:  The patient  reports that he  quit smoking about 8 years ago. His smoking use included cigarettes. He has a 34.00 pack-year smoking history. He has never used smokeless tobacco. He reports current alcohol use. He reports that he does not use drugs.   Family History:  The patient's family history includes Aneurysm in his mother.    ROS:  Please see the history of present illness.   Otherwise, review of systems are positive for none.   All other systems are reviewed and negative.    PHYSICAL EXAM: VS:  BP 132/74   Pulse 79   Ht 6' 1.5" (1.867 m)   Wt 241 lb 12.8 oz (109.7 kg)   SpO2 94%   BMI 31.47 kg/m  , BMI Body mass index is 31.47 kg/m. GEN: Well nourished, well developed, in no acute distress HEENT: sclera anicteric Neck: no JVD, carotid bruits, or masses Cardiac: RRR; no murmurs, rubs, or gallops, no edema  Respiratory:  clear to auscultation bilaterally, normal work of breathing GI: soft, nontender, nondistended, + BS MS: no deformity or atrophy Skin: warm and dry, no rash Neuro:  Strength and sensation are intact Psych: euthymic mood, full affect   EKG:  EKG is not ordered today.   Recent Labs: No results found for requested labs within last 8760 hours.    Lipid Panel    Component Value Date/Time   CHOL 190 02/14/2016 1036   CHOL 243 (H) 03/15/2013 0831   TRIG 128 02/14/2016 1036   TRIG 245 (H) 03/15/2013 0831   HDL 55 02/14/2016 1036   HDL 53 03/15/2013 0831   CHOLHDL 3.5 02/14/2016 1036   CHOLHDL 2.7 07/11/2015 0748   VLDL 29 07/11/2015 0748   LDLCALC 109 (H) 02/14/2016 1036   LDLCALC 141 (H) 03/15/2013 0831   LDLDIRECT 98.0 06/28/2014 0751      Wt Readings from Last 3 Encounters:  01/26/18 241 lb 12.8 oz (109.7 kg)  12/08/17 246 lb (111.6 kg)  09/09/17 232 lb (105.2 kg)      Other studies Reviewed: Additional studies/ records that were reviewed today include:   Echocardiogram 04/2015: Study Conclusions  - Left ventricle: The cavity size was mildly dilated. Wall    thickness was normal. Systolic function was moderately to   severely reduced. The estimated ejection fraction was in the   range of 30% to 35%. Anteroseptal akinesis, mid to apical   inferoseptal akinesis, apical anterior akinesis, apical inferior   akinesis, akinesis of the true apex. Doppler parameters are   consistent with abnormal left ventricular relaxation (grade 1   diastolic dysfunction). - Aortic valve: There was no stenosis. - Aorta: Mildly dilated aortic root. Aortic root dimension: 38 mm   (ED). - Mitral valve: There was trivial regurgitation. - Left atrium: The atrium was  mildly dilated. - Right ventricle: The cavity size was normal. Pacer wire or   catheter noted in right ventricle. Systolic function was normal. - Tricuspid valve: Peak RV-RA gradient (S): 23 mm Hg. - Pulmonary arteries: PA peak pressure: 31 mm Hg (S). - Systemic veins: IVC measured 2.4 cm with normal respirophasic   variation, suggesting RA pressure 8 mmHg.  Impressions:  - Mildly dilated LV. EF 30-35%. Wall motion abnormalities as noted   above in the distribution of LAD infarction. Normal RV size and   systolic function. No significant valvular abnormalities.   ASSESSMENT AND PLAN:  1. CAD s/p anterior wall MI c/b Vfib arrest, managed with PCI to LAD in 2011: no anginal complaints. He continues to be active with hunting/fishing. He is intolerant to statins and has been participating in the [place] study for cholesterol management  - Continue aspirin, zetia, fenofibrate, and study drug - Continue carvedilol  2. Ischemic cardiomyopathy: EF 30-35% on last echo 2017. He is s/p ICD placement and follows with Dr. Lovena [name] for this. No complaints of volume overload and he appears euvolemic on exam. No complaints of ICD shock. Last ICD check 09/2017 with normal device function and stable battery life.  - Continue carvedilol and losartan - Continue routine follow-up with Dr. Lovena [name]   3. HTN: BP stable today -  Continue carvedilol and losartan  4. HLD: lipids being followed through the [place] lipid trial. No recent lipids available for review. He is on zetia, fenofibrate, and study drug at this time - Continue management per study coordinator in the [place] trial.   5. Tobacco abuse: he continues to smoke 1/2-1ppd and understands health risks associated with smoking. He has tried patches and chantix in the past without much benefit. He is hopeful to quit cold [place].  - Continue to encourage smoking cessation   Current medicines are reviewed at length with the patient today.  The patient does not have concerns regarding medicines.  The following changes have been made:  no change  Labs/ tests ordered today include:  No orders of the defined types were placed in this encounter.    Disposition:   FU with Dr. Radford [name] in 6 months  Signed, Abigail Butts, PA-C  01/26/2018 5:17 PM

## 2018-01-30 ENCOUNTER — Other Ambulatory Visit: Payer: Self-pay | Admitting: Physician Assistant

## 2018-01-30 LAB — CUP PACEART REMOTE DEVICE CHECK
Date Time Interrogation Session: 20200118203231
Implantable Lead Implant Date: 20121227
Implantable Lead Location: 753860
Implantable Pulse Generator Implant Date: 20121227
Pulse Gen Serial Number: 1037513

## 2018-02-01 DIAGNOSIS — M25511 Pain in right shoulder: Secondary | ICD-10-CM | POA: Diagnosis not present

## 2018-02-01 DIAGNOSIS — Z720 Tobacco use: Secondary | ICD-10-CM | POA: Diagnosis not present

## 2018-02-03 ENCOUNTER — Ambulatory Visit: Payer: Medicare HMO | Admitting: Internal Medicine

## 2018-02-03 ENCOUNTER — Encounter: Payer: Self-pay | Admitting: Internal Medicine

## 2018-02-03 VITALS — BP 152/94 | HR 85 | Ht 73.0 in | Wt 238.4 lb

## 2018-02-03 DIAGNOSIS — Z95810 Presence of automatic (implantable) cardiac defibrillator: Secondary | ICD-10-CM | POA: Diagnosis not present

## 2018-02-03 DIAGNOSIS — I5022 Chronic systolic (congestive) heart failure: Secondary | ICD-10-CM

## 2018-02-03 DIAGNOSIS — I10 Essential (primary) hypertension: Secondary | ICD-10-CM | POA: Diagnosis not present

## 2018-02-03 DIAGNOSIS — I255 Ischemic cardiomyopathy: Secondary | ICD-10-CM

## 2018-02-03 NOTE — Progress Notes (Signed)
HPI [name] returns today for ongoing evaluation and management of chronic systolic heart failure, HTN, s/p ICD insertion. He has done well in the interim with no chest pain or sob. No ICD shocks. He has been saddened by the break up of he and his fiance. Allergies  Allergen Reactions  . Imdur [Isosorbide Dinitrate] Other (See Comments)    headache   . Crestor [Rosuvastatin]     Muscle aches on 5 mg qd and 20 mg qd  . Lipitor [Atorvastatin]     Made his irritable and anxious     Current Outpatient Medications  Medication Sig Dispense Refill  . AMBULATORY NON FORMULARY MEDICATION Inject 300 mg into the skin as directed. Medication Name: Inclisiran sodium vs placebo    . aspirin 81 MG tablet Take 81 mg by mouth daily.      . carvedilol (COREG) 12.5 MG tablet TAKE 1 TABLET BY MOUTH TWICE DAILY WITH MEALS 180 tablet 3  . co-enzyme Q-10 30 MG capsule Take 5 capsules (150 mg total) by mouth daily.    Marland Kitchen ezetimibe (ZETIA) 10 MG tablet Take 1 tablet (10 mg total) by mouth daily. 10 tablet 0  . fenofibrate 160 MG tablet TAKE 1 TABLET(160 MG) BY MOUTH DAILY 90 tablet 3  . fish oil-omega-3 fatty acids 1000 MG capsule Take 4 g by mouth daily.     Marland Kitchen losartan (COZAAR) 50 MG tablet TAKE 1 TABLET(50 MG) BY MOUTH DAILY 90 tablet 3  . nitroGLYCERIN (NITROSTAT) 0.4 MG SL tablet Place 1 tablet (0.4 mg total) under the tongue every 5 (five) minutes as needed for chest pain. Up to 3 doses 25 tablet 3   No current facility-administered medications for this visit.      Past Medical History:  Diagnosis Date  . Chronic systolic CHF (congestive heart failure), NYHA class 1 (HCC)    EF 37% by nuclear stress test  . Coronary artery disease    s/p PCI mild LAD (BMS) in setting of AWMI complicated by Vfib arrest with 40-50% residual disesae in left circ  . Hyperlipidemia   . Ischemic cardiomyopathy    A. 01/09/2011 - s/p St. Jude Fortify ST [place] AICD  . OSA (obstructive sleep apnea)    severe with AHI 26/hr now on CPAP at [facility]  . VF (ventricular fibrillation) ([place])    arrest in setting of AMI 6/11    ROS:   All systems reviewed and negative except as noted in the HPI.   Past Surgical History:  Procedure Laterality Date  . BACK SURGERY    . BACK SURGERY    . IMPLANTABLE CARDIOVERTER DEFIBRILLATOR IMPLANT N/A 01/09/2011   Procedure: IMPLANTABLE CARDIOVERTER DEFIBRILLATOR IMPLANT;  Surgeon: Evans Lance, MD;  Location: [facility] CATH LAB;  Service: Cardiovascular;  Laterality: N/A;  . KNEE ARTHROSCOPY     right  . PACEMAKER INSERTION  2012   St Jude     Family History  Problem Relation Age of Onset  . Aneurysm Mother      Social History   Socioeconomic History  . Marital status: Single    Spouse name: Not on file  . Number of children: Not on file  . Years of education: Not on file  . Highest education level: Not on file  Occupational History  . Not on file  Social Needs  . Financial resource strain: Not on file  . Food insecurity:    Worry: Not on file  Inability: Not on file  . Transportation needs:    Medical: Not on file    Non-medical: Not on file  Tobacco Use  . Smoking status: Former Smoker    Packs/day: 1.00    Years: 34.00    Pack years: 34.00    Types: Cigarettes    Last attempt to quit: 06/13/2009    Years since quitting: 8.6  . Smokeless tobacco: Never Used  . Tobacco comment: Socially  Substance and Sexual Activity  . Alcohol use: Yes    Comment: occasional  . Drug use: No  . Sexual activity: Yes  Lifestyle  . Physical activity:    Days per week: Not on file    Minutes per session: Not on file  . Stress: Not on file  Relationships  . Social connections:    Talks on phone: Not on file    Gets together: Not on file    Attends religious service: Not on file    Active member of club or organization: Not on file    Attends meetings of clubs or organizations: Not on file    Relationship status: Not on file  . Intimate  partner violence:    Fear of current or ex partner: Not on file    Emotionally abused: Not on file    Physically abused: Not on file    Forced sexual activity: Not on file  Other Topics Concern  . Not on file  Social History Narrative  . Not on file     BP (!) 152/94   Pulse 85   Ht 6\' 1"  (1.854 m)   Wt 238 lb 6.4 oz (108.1 kg)   SpO2 99%   BMI 31.45 kg/m   Physical Exam:  Well appearing NAD HEENT: Unremarkable Neck:  No JVD, no thyromegally Lymphatics:  No adenopathy Back:  No CVA tenderness Lungs:  Clear with no wheezes HEART:  Regular rate rhythm, no murmurs, no rubs, no clicks Abd:  soft, positive bowel sounds, no organomegally, no rebound, no guarding Ext:  2 plus pulses, no edema, no cyanosis, no clubbing Skin:  No rashes no nodules Neuro:  CN II through XII intact, motor grossly intact  EKG - none  DEVICE  Normal device function.  See PaceArt for details.   Assess/Plan: 1. ICD - his St. Jude device is working normally. We will recheck in several months. 2. CAD - he denies anginal symptoms and has increased his activity. 3. Chronic systolic heart failure - his symptoms are class 2. He will continue his current meds.   Joe [name].D.

## 2018-02-03 NOTE — Patient Instructions (Signed)
Medication Instructions:  Your physician recommends that you continue on your current medications as directed. Please refer to the Current Medication list given to you today.  Labwork: None ordered.  Testing/Procedures: None ordered.  Follow-Up: Your physician wants you to follow-up in: one year with Dr. Lovena Le.   You will receive a reminder letter in the mail two months in advance. If you don't receive a letter, please call our office to schedule the follow-up appointment.  Remote monitoring is used to monitor your ICD from home. This monitoring reduces the number of office visits required to check your device to one time per year. It allows [place] to keep an eye on the functioning of your device to ensure it is working properly. You are scheduled for a device check from home on 03/31/2018. You may send your transmission at any time that day. If you have a wireless device, the transmission will be sent automatically. After your physician reviews your transmission, you will receive a postcard with your next transmission date.  Any Other Special Instructions Will Be Listed Below (If Applicable).  If you need a refill on your cardiac medications before your next appointment, please call your pharmacy.

## 2018-02-04 LAB — CUP PACEART INCLINIC DEVICE CHECK
Date Time Interrogation Session: 20200123140700
Implantable Lead Model: 7122
Implantable Pulse Generator Implant Date: 20121227
MDC IDC LEAD IMPLANT DT: 20121227
MDC IDC LEAD LOCATION: 753860
Pulse Gen Serial Number: 1037513

## 2018-02-05 ENCOUNTER — Encounter: Payer: Medicare HMO | Admitting: Internal Medicine

## 2018-02-05 DIAGNOSIS — F439 Reaction to severe stress, unspecified: Secondary | ICD-10-CM | POA: Diagnosis not present

## 2018-02-05 DIAGNOSIS — F419 Anxiety disorder, unspecified: Secondary | ICD-10-CM | POA: Diagnosis not present

## 2018-02-16 ENCOUNTER — Other Ambulatory Visit: Payer: Self-pay | Admitting: Internal Medicine

## 2018-02-18 ENCOUNTER — Telehealth: Payer: Self-pay | Admitting: *Deleted

## 2018-02-18 NOTE — Telephone Encounter (Signed)
Perioperative device clearance form received from [facility]. Will complete and fax back tomorrow with most recent device check from 02/03/18.

## 2018-02-18 NOTE — Telephone Encounter (Signed)
   [facility] Pre-operative Risk Assessment    Request for surgical clearance:  1. What type of surgery is being performed? SHOULDER SURGERY   2. When is this surgery scheduled? 02/25/18   3. What type of clearance is required (medical clearance vs. Pharmacy clearance to hold med vs. Both)? MEDICAL; [facility] IS ASKING FOR PACEMAKER/DEVICE CHECK AS WELL  4. Are there any medications that need to be held prior to surgery and how long?NONE LISTED ; LMOM IF ANY MEDS NEED TO BE HELD  5. Practice name and name of physician performing surgery? [facility]   6. What is your office phone number [phone]    7.   What is your office fax number [phone]  8.   Anesthesia type (None, local, MAC, general) ? NONE LISTED; LEFT MESSAGE TO CONFIRM TYPE OF ANESTHESIA   Julaine Hua 02/18/2018, 4:16 PM  _________________________________________________________________   (provider comments below)

## 2018-02-19 NOTE — Telephone Encounter (Signed)
Perioperative clearance form filled out and faxed to [place]. Confirmation received.

## 2018-02-22 NOTE — Telephone Encounter (Signed)
   Primary Cardiologist: Fransico Him, MD  Chart reviewed as part of pre-operative protocol coverage. Patient was contacted 02/22/2018 in reference to pre-operative risk assessment for pending surgery as outlined below.  Joe [name] was last seen on 01/26/2018 by Thompson Caul PA-C.  Since that day, [name] has done well without any chest pain or shortness of breath.   Therefore, based on ACC/AHA guidelines, the patient would be at acceptable risk for the planned procedure without further cardiovascular testing.   I will route this recommendation to the requesting party via Epic fax function and remove from pre-op pool.  Please call with questions. Surgeon may place magnet over the single chamber ICD during surgery to inhibit detection of tachyarrhythmia and pacing. Patient is not pacemaker dependent according to our device clinic staff.    [place], [place] 02/22/2018, 4:22 PM

## 2018-02-23 NOTE — Telephone Encounter (Signed)
Called and faxed clearance to [facility]

## 2018-02-25 DIAGNOSIS — G8918 Other acute postprocedural pain: Secondary | ICD-10-CM | POA: Diagnosis not present

## 2018-02-25 DIAGNOSIS — M75121 Complete rotator cuff tear or rupture of right shoulder, not specified as traumatic: Secondary | ICD-10-CM | POA: Diagnosis not present

## 2018-02-25 DIAGNOSIS — M7541 Impingement syndrome of right shoulder: Secondary | ICD-10-CM | POA: Diagnosis not present

## 2018-02-25 DIAGNOSIS — F17200 Nicotine dependence, unspecified, uncomplicated: Secondary | ICD-10-CM | POA: Diagnosis not present

## 2018-02-25 DIAGNOSIS — I5020 Unspecified systolic (congestive) heart failure: Secondary | ICD-10-CM | POA: Diagnosis not present

## 2018-02-25 DIAGNOSIS — G4730 Sleep apnea, unspecified: Secondary | ICD-10-CM | POA: Diagnosis not present

## 2018-02-25 DIAGNOSIS — M75111 Incomplete rotator cuff tear or rupture of right shoulder, not specified as traumatic: Secondary | ICD-10-CM | POA: Diagnosis not present

## 2018-02-25 DIAGNOSIS — I2510 Atherosclerotic heart disease of native coronary artery without angina pectoris: Secondary | ICD-10-CM | POA: Diagnosis not present

## 2018-02-25 DIAGNOSIS — I110 Hypertensive heart disease with heart failure: Secondary | ICD-10-CM | POA: Diagnosis not present

## 2018-02-25 DIAGNOSIS — E669 Obesity, unspecified: Secondary | ICD-10-CM | POA: Diagnosis not present

## 2018-02-25 DIAGNOSIS — M19011 Primary osteoarthritis, right shoulder: Secondary | ICD-10-CM | POA: Diagnosis not present

## 2018-03-01 ENCOUNTER — Telehealth: Payer: Self-pay

## 2018-03-01 NOTE — Telephone Encounter (Signed)
MR Chandan CALLED TODAY TO GET A REFILL ON HIS ZETIA, I CALLED THE [facility] AND THEY SAID THAT IT WAS TOO EARLY TO GET A REFILL. THEY TOLD ME TO CALL BACK THE END OF 03/19/2018

## 2018-03-18 ENCOUNTER — Telehealth: Payer: Self-pay

## 2018-03-18 NOTE — Telephone Encounter (Signed)
CALLED TO ORDER A NEW REFILL FROM [facility], WILL ARRIVE 7 TO 10 BUSINESS DAYS

## 2018-03-22 ENCOUNTER — Other Ambulatory Visit: Payer: Self-pay | Admitting: Internal Medicine

## 2018-03-31 ENCOUNTER — Ambulatory Visit (INDEPENDENT_AMBULATORY_CARE_PROVIDER_SITE_OTHER): Payer: Medicare HMO | Admitting: *Deleted

## 2018-03-31 ENCOUNTER — Other Ambulatory Visit: Payer: Self-pay

## 2018-03-31 DIAGNOSIS — I255 Ischemic cardiomyopathy: Secondary | ICD-10-CM

## 2018-04-01 ENCOUNTER — Telehealth: Payer: Self-pay

## 2018-04-01 NOTE — Telephone Encounter (Signed)
Spoke with patient to remind of missed remote transmission 

## 2018-04-02 LAB — CUP PACEART REMOTE DEVICE CHECK
Date Time Interrogation Session: 20200320085712
Implantable Lead Implant Date: 20121227
MDC IDC LEAD LOCATION: 753860
MDC IDC PG IMPLANT DT: 20121227
MDC IDC PG SERIAL: 1037513

## 2018-04-08 ENCOUNTER — Encounter: Payer: Self-pay | Admitting: Cardiology

## 2018-04-08 NOTE — Progress Notes (Signed)
Remote ICD transmission.   

## 2018-05-18 ENCOUNTER — Telehealth: Payer: Self-pay | Admitting: Cardiology

## 2018-05-18 NOTE — Telephone Encounter (Signed)
Virtual Visit Pre-Appointment Phone Call  "(Name), I am calling you today to discuss your upcoming appointment. We are currently trying to limit exposure to the virus that causes COVID-19 by seeing patients at home rather than in the office."  1. "What is the BEST phone number to call the day of the visit?" - include this in appointment notes  2. Do you have or have access to (through a family member/friend) a smartphone with video capability that we can use for your visit?" a. If yes - list this number in appt notes as cell (if different from BEST phone #) and list the appointment type as a VIDEO visit in appointment notes b. If no - list the appointment type as a PHONE visit in appointment notes  Confirm consent - "In the setting of the current Covid19 crisis, you are scheduled for a (phone or video) visit with your provider on (date) at (time).  Just as we do with many in-office visits, in order for you to participate in this visit, we must obtain consent.  If you'd like, I can send this to your mychart (if signed up) or email for you to review.  Otherwise, I can obtain your verbal consent now.  All virtual visits are billed to your insurance company just like a normal visit would be.  By agreeing to a virtual visit, we'd like you to understand that the technology does not allow for your provider to perform an examination, and thus may limit your provider's ability to fully assess your condition. If your provider identifies any concerns that need to be evaluated in person, we will make arrangements to do so.  Finally, though the technology is pretty good, we cannot assure that it will always work on either your or our end, and in the setting of a video visit, we may have to convert it to a phone-only visit.  In either situation, we cannot ensure that we have a secure connection.  Are you willing to proceed?"  Yes/ Telephone only 1 [phone]  3. Advise patient to be prepared - "Two hours  prior to your appointment, go ahead and check your blood pressure, pulse, oxygen saturation, and your weight (if you have the equipment to check those) and write them all down. When your visit starts, your provider will ask you for this information. If you have an Apple Watch or Kardia device, please plan to have heart rate information ready on the day of your appointment. Please have a pen and paper handy nearby the day of the visit as well."  4. Give patient instructions for MyChart download to smartphone OR [email or web address] as below if video visit (depending on what platform provider is using)  5. Inform patient they will receive a phone call 15 minutes prior to their appointment time (may be from unknown caller ID) so they should be prepared to answer    TELEPHONE CALL NOTE  Joe [name] has been deemed a candidate for a follow-up tele-health visit to limit community exposure during the Covid-19 pandemic. I spoke with the patient via phone to ensure availability of phone/video source, confirm preferred email & phone number, and discuss instructions and expectations.  I reminded Joe [name] to be prepared with any vital sign and/or heart rhythm information that could potentially be obtained via home monitoring, at the time of his visit. I reminded Joe [name] to expect a phone call prior to his visit.  Armando Gang 05/18/2018 12:47  PM   INSTRUCTIONS FOR DOWNLOADING THE MYCHART APP TO SMARTPHONE  - The patient must first make sure to have activated MyChart and know their login information - If Apple, go to [employer] and type in MyChart in the search bar and download the app. If Android, ask patient to go to [employer] and type in [place] in the search bar and download the app. The app is free but as with any other app downloads, their phone may require them to verify saved payment information or Apple/Android password.  - The patient will need to then log into  the app with their MyChart username and password, and select [facility] as their healthcare provider to link the account. When it is time for your visit, go to the MyChart app, find appointments, and click Begin Video Visit. Be sure to Select Allow for your device to access the Microphone and Camera for your visit. You will then be connected, and your provider will be with you shortly.  **If they have any issues connecting, or need assistance please contact MyChart service desk [phone])**  **If using a computer, in order to ensure the best quality for their visit they will need to use either of the following Internet Browsers: [employer], or Google Chrome**  IF USING DOXIMITY or DOXY.ME - The patient will receive a link just prior to their visit by text.     FULL LENGTH CONSENT FOR TELE-HEALTH VISIT   I hereby voluntarily request, consent and authorize [facility] and its employed or contracted physicians, physician assistants, nurse practitioners or other licensed health care professionals (the Practitioner), to provide me with telemedicine health care services (the Services") as deemed necessary by the treating Practitioner. I acknowledge and consent to receive the Services by the Practitioner via telemedicine. I understand that the telemedicine visit will involve communicating with the Practitioner through live audiovisual communication technology and the disclosure of certain medical information by electronic transmission. I acknowledge that I have been given the opportunity to request an in-person assessment or other available alternative prior to the telemedicine visit and am voluntarily participating in the telemedicine visit.  I understand that I have the right to withhold or withdraw my consent to the use of telemedicine in the course of my care at any time, without affecting my right to future care or treatment, and that the Practitioner or I may terminate the  telemedicine visit at any time. I understand that I have the right to inspect all information obtained and/or recorded in the course of the telemedicine visit and may receive copies of available information for a reasonable fee.  I understand that some of the potential risks of receiving the Services via telemedicine include:   Delay or interruption in medical evaluation due to technological equipment failure or disruption;  Information transmitted may not be sufficient (e.g. poor resolution of images) to allow for appropriate medical decision making by the Practitioner; and/or   In rare instances, security protocols could fail, causing a breach of personal health information.  Furthermore, I acknowledge that it is my responsibility to provide information about my medical history, conditions and care that is complete and accurate to the best of my ability. I acknowledge that Practitioner's advice, recommendations, and/or decision may be based on factors not within their control, such as incomplete or inaccurate data provided by me or distortions of diagnostic images or specimens that may result from electronic transmissions. I understand that the practice of medicine is not  an Chief Strategy Officer and that Practitioner makes no warranties or guarantees regarding treatment outcomes. I acknowledge that I will receive a copy of this consent concurrently upon execution via email to the email address I last provided but may also request a printed copy by calling the office of [facility].    I understand that my insurance will be billed for this visit.   I have read or had this consent read to me.  I understand the contents of this consent, which adequately explains the benefits and risks of the Services being provided via telemedicine.   I have been provided ample opportunity to ask questions regarding this consent and the Services and have had my questions answered to my satisfaction.  I give my informed  consent for the services to be provided through the use of telemedicine in my medical care  By participating in this telemedicine visit I agree to the above.

## 2018-05-19 ENCOUNTER — Other Ambulatory Visit: Payer: Self-pay

## 2018-05-19 ENCOUNTER — Telehealth: Payer: Medicare HMO | Admitting: Cardiology

## 2018-05-19 NOTE — Progress Notes (Deleted)
Virtual Visit via Video Note   This visit type was conducted due to national recommendations for restrictions regarding the COVID-19 Pandemic (e.g. social distancing) in an effort to limit this patient's exposure and mitigate transmission in our community.  Due to his co-morbid illnesses, this patient is at least at moderate risk for complications without adequate follow up.  This format is felt to be most appropriate for this patient at this time.  All issues noted in this document were discussed and addressed.  A limited physical exam was performed with this format.  Please refer to the patient's chart for his consent to telehealth for [facility].   Evaluation Performed:  Follow-up visit  This visit type was conducted due to national recommendations for restrictions regarding the COVID-19 Pandemic (e.g. social distancing).  This format is felt to be most appropriate for this patient at this time.  All issues noted in this document were discussed and addressed.  No physical exam was performed (except for noted visual exam findings with Video Visits).  Please refer to the patient's chart (MyChart message for video visits and phone note for telephone visits) for the patient's consent to telehealth for [facility].  Date:  05/19/2018   ID:  Isaias Cowman, DOB [birth year], MRN [id number]  Patient Location:  Home  Provider location:   [place]  PCP:  Briscoe Deutscher, MD  Cardiologist:  Fransico Him, MD  Electrophysiologist:  Cristopher Peru, MD   Chief Complaint:  followup CAD, DCM, CHF, OSA  History of Present Illness:    JOZIAH DOLLINS is a 63 y.o. male who presents via audio/video conferencing for a telehealth visit today.    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  I have not seen him in some time and has been seen by my PA and Dr. Lovena Le.    He is here today for followup and is doing well.  He denies any chest pain or pressure, SOB, DOE, PND, orthopnea, LE edema, dizziness, palpitations or syncope. He is compliant with his meds and is tolerating meds with no SE.  He is doing well with his CPAP device and thinks that he has gotten used to it.  He tolerates the mask and feels the pressure is adequate.  Since going on CPAP he feels rested in the am and has no significant daytime sleepiness.  He denies any significant mouth or nasal dryness or nasal congestion.  He does not think that he snores.     The patient does not have symptoms concerning for COVID-19 infection (fever, chills, cough, or new shortness of breath).    Prior CV studies:   The following studies were reviewed today:  PAP compliance download  Past Medical History:  Diagnosis Date  . Chronic systolic CHF (congestive heart failure), NYHA class 1 (HCC)    EF 37% by nuclear stress test  . Coronary artery disease    s/p PCI mild LAD (BMS) in setting of AWMI complicated by Vfib arrest with 40-50% residual disesae in left circ  . Hyperlipidemia   . Ischemic cardiomyopathy    A. 01/09/2011 - s/p St. Jude Fortify ST VR [place] AICD  . OSA (obstructive sleep apnea)  severe with AHI 26/hr now on CPAP at [facility]  . VF (ventricular fibrillation) ([place])    arrest in setting of AMI 6/11   Past Surgical History:  Procedure Laterality Date  . BACK SURGERY    . BACK SURGERY    . IMPLANTABLE CARDIOVERTER DEFIBRILLATOR IMPLANT N/A 01/09/2011   Procedure: IMPLANTABLE CARDIOVERTER DEFIBRILLATOR IMPLANT;  Surgeon: Evans Lance, MD;  Location: [facility] CATH LAB;  Service: Cardiovascular;  Laterality: N/A;  . KNEE ARTHROSCOPY     right  . PACEMAKER INSERTION  2012   St Jude     No outpatient medications have been marked as taking for the  05/19/18 encounter (Appointment) with Sueanne Margarita, MD.     Allergies:   Imdur [isosorbide dinitrate]; Crestor [rosuvastatin]; and Lipitor [atorvastatin]   Social History   Tobacco Use  . Smoking status: Former Smoker    Packs/day: 1.00    Years: 34.00    Pack years: 34.00    Types: Cigarettes    Last attempt to quit: 06/13/2009    Years since quitting: 8.9  . Smokeless tobacco: Never Used  . Tobacco comment: Socially  Substance Use Topics  . Alcohol use: Yes    Comment: occasional  . Drug use: No     Family Hx: The patient's family history includes Aneurysm in his mother.  ROS:   Please see the history of present illness.     All other systems reviewed and are negative.   Labs/Other Tests and Data Reviewed:    Recent Labs: No results found for requested labs within last 8760 hours.   Recent Lipid Panel Lab Results  Component Value Date/Time   CHOL 190 02/14/2016 10:36 AM   CHOL 243 (H) 03/15/2013 08:31 AM   TRIG 128 02/14/2016 10:36 AM   TRIG 245 (H) 03/15/2013 08:31 AM   HDL 55 02/14/2016 10:36 AM   HDL 53 03/15/2013 08:31 AM   CHOLHDL 3.5 02/14/2016 10:36 AM   CHOLHDL 2.7 07/11/2015 07:48 AM   LDLCALC 109 (H) 02/14/2016 10:36 AM   LDLCALC 141 (H) 03/15/2013 08:31 AM   LDLDIRECT 98.0 06/28/2014 07:51 AM    Wt Readings from Last 3 Encounters:  02/03/18 238 lb 6.4 oz (108.1 kg)  01/26/18 241 lb 12.8 oz (109.7 kg)  12/08/17 246 lb (111.6 kg)     Objective:    Vital Signs:  There were no vitals taken for this visit.   CONSTITUTIONAL:  Well nourished, well developed male in no acute distress.  EYES: anicteric MOUTH: oral mucosa is pink RESPIRATORY: Normal respiratory effort, symmetric expansion CARDIOVASCULAR: No peripheral edema SKIN: No rash, lesions or ulcers MUSCULOSKELETAL: no digital cyanosis NEURO: Cranial Nerves II-XII grossly intact, moves all extremities PSYCH: Intact judgement and insight.  A&O x 3, Mood/affect appropriate   ASSESSMENT  & PLAN:    1.  OSA - the patient is tolerating PAP therapy well without any problems. I will get a download from his DME>  The patient has been using and benefiting from PAP use and will continue to benefit from therapy.   2.  ASCAD - s/p PCI mild LAD (BMS) in setting of AWMI complicated by Vfib arrest with 40-50% residual disesae in left circ bu cath 2011. He has done well with no angina sx.  He will continue on ASA 81mg  daily and BB.  He is statin intolerant.   3.  Ischemic DCM  - his last EF assessment was 2017 with EF 30-35% and focal wall motion abnormalities in  the apex, apical inferior and anterior and infero/anteroseptal AK.   He is S/P AICD followed in device clinic by Dr. Lovena Le.  4.  Chronic combined systolic/diastolic CHF - he is NHYA class II HF and appears compensated.  He denies any SOB or LE edema and says that his weight has been stable.  He will continue on Carvedilol 12.5mg  BID and losartan 50mg  daily.  I will repeat an echo and if EF still reduce then change losartan to Entresto.    5.  Hypertension - his BP is controlled.  He will continue on carvedilol and Losartan.  I will check a BMET.  6.  Hyperlipidemia - LDL goal is < 70.  He will continue on fenofibrate 160mg  daily and zetia 10mg  daily.  He is statin intolerant.  His last LDL was 109 2 year ago.  I will repeat an FLp and ALT.   7.  COVID-19 Education:The signs and symptoms of COVID-19 were discussed with the patient and how to seek care for testing (follow up with PCP or arrange E-visit).  The importance of social distancing was discussed today.  Patient Risk:   After full review of this patient's clinical status, I feel that they are at least moderate risk at this time.  Time:   Today, I have spent *** minutes directly with the patient on video discussing medical problems including OSA, CAD, CHF, DCM, lipids, HTN.  We also reviewed the symptoms of COVID 19 and the ways to protect against contracting the virus with  telehealth technology.  I spent an additional 10 minutes reviewing patient's chart including 2D echo, sleep study, prior office notes.  Medication Adjustments/Labs and Tests Ordered: Current medicines are reviewed at length with the patient today.  Concerns regarding medicines are outlined above.  Tests Ordered: No orders of the defined types were placed in this encounter.  Medication Changes: No orders of the defined types were placed in this encounter.   Disposition:  Follow up {follow up:15908}  Signed, Fransico Him, MD  05/19/2018 8:47 AM    [facility]

## 2018-06-03 ENCOUNTER — Telehealth: Payer: Self-pay | Admitting: *Deleted

## 2018-06-03 NOTE — Telephone Encounter (Signed)
Called subject to cancel appointment in the [facility] 8 research trial.  Will call as soon as clinic reopens to visits.  No cos, aes or saes to report.

## 2018-06-13 NOTE — Progress Notes (Signed)
Virtual Visit via Telephone Note   This visit type was conducted due to national recommendations for restrictions regarding the COVID-19 Pandemic (e.g. social distancing) in an effort to limit this patient's exposure and mitigate transmission in our community.  Due to his co-morbid illnesses, this patient is at least at moderate risk for complications without adequate follow up.  This format is felt to be most appropriate for this patient at this time.  The patient did not have access to video technology/had technical difficulties with video requiring transitioning to audio format only (telephone).  All issues noted in this document were discussed and addressed.  No physical exam could be performed with this format.  Please refer to the patient's chart for his  consent to telehealth for [facility].   Evaluation Performed:  Follow-up visit  This visit type was conducted due to national recommendations for restrictions regarding the COVID-19 Pandemic (e.g. social distancing).  This format is felt to be most appropriate for this patient at this time.  All issues noted in this document were discussed and addressed.  No physical exam was performed (except for noted visual exam findings with Video Visits).  Please refer to the patient's chart (MyChart message for video visits and phone note for telephone visits) for the patient's consent to telehealth for [facility].  Date:  06/14/2018   ID:  Joe [name], DOB 08-14-1955, MRN [id number]  Patient Location:  Home  Provider location:   [place]  PCP:  Briscoe Deutscher, MD  Cardiologist:  Fransico Him, MD  Electrophysiologist:  Cristopher Peru, MD   Chief Complaint:  OSA, CAD, CHF, lipids, DCM  History of Present Illness:    Joe [name] is a 63 y.o. male who presents via audio/video conferencing for a telehealth visit today.    Joe [name] is a 63 y.o. male with a history of ASCAD, ischemic DCM with AICD, chronic systolic  CHF class I, dyslipidemia, obesity and OSA on CPAP.  He is followed by Dr. Lovena Le for his CHF and ICD.    He is here today for followup and is doing well.  He denies any chest pain or pressure, SOB, DOE, PND, orthopnea, LE edema, dizziness, palpitations or syncope. He is compliant with his meds and is tolerating meds with no SE.     He also has a history of OSA but has not been using his PAP device because he did not tolerate the mask.    The patient does not have symptoms concerning for COVID-19 infection (fever, chills, cough, or new shortness of breath).    Prior CV studies:   The following studies were reviewed today:  none  Past Medical History:  Diagnosis Date  . Chronic systolic CHF (congestive heart failure), NYHA class 1 (HCC)    EF 37% by nuclear stress test  . Coronary artery disease    s/p PCI mild LAD (BMS) in setting of AWMI complicated by Vfib arrest with 40-50% residual disesae in left circ  . Hyperlipidemia   . Ischemic cardiomyopathy    A. 01/09/2011 - s/p St. Jude Fortify ST VR [place] AICD  . OSA (obstructive sleep apnea)    severe with AHI 26/hr intolerant to CPAP  . VF (ventricular fibrillation) ([place])    arrest in setting of AMI 6/11   Past Surgical History:  Procedure Laterality Date  . BACK SURGERY    . BACK SURGERY    . IMPLANTABLE CARDIOVERTER DEFIBRILLATOR IMPLANT N/A 01/09/2011   Procedure: IMPLANTABLE  CARDIOVERTER DEFIBRILLATOR IMPLANT;  Surgeon: Evans Lance, MD;  Location: [facility] CATH LAB;  Service: Cardiovascular;  Laterality: N/A;  . KNEE ARTHROSCOPY     right  . PACEMAKER INSERTION  2012   St Jude     Current Meds  Medication Sig  . AMBULATORY NON FORMULARY MEDICATION Inject 300 mg into the skin as directed. Medication Name: Inclisiran sodium vs placebo  . aspirin 81 MG tablet Take 81 mg by mouth daily.    . carvedilol (COREG) 12.5 MG tablet TAKE 1 TABLET BY MOUTH TWICE DAILY WITH MEALS  . ezetimibe (ZETIA) 10 MG tablet Take 1 tablet (10 mg  total) by mouth daily.  . fenofibrate 160 MG tablet TAKE 1 TABLET(160 MG) BY MOUTH DAILY  . fish oil-omega-3 fatty acids 1000 MG capsule Take 4 g by mouth daily.   Marland Kitchen losartan (COZAAR) 50 MG tablet TAKE 1 TABLET(50 MG) BY MOUTH DAILY  . nitroGLYCERIN (NITROSTAT) 0.4 MG SL tablet Place 1 tablet (0.4 mg total) under the tongue every 5 (five) minutes as needed for chest pain. Up to 3 doses     Allergies:   Isosorbide dinitrate; Rosuvastatin; and Atorvastatin   Social History   Tobacco Use  . Smoking status: Former Smoker    Packs/day: 1.00    Years: 34.00    Pack years: 34.00    Types: Cigarettes    Last attempt to quit: 06/13/2009    Years since quitting: 9.0  . Smokeless tobacco: Never Used  . Tobacco comment: Socially  Substance Use Topics  . Alcohol use: Yes    Comment: occasional  . Drug use: No     Family Hx: The patient's family history includes Aneurysm in his mother.  ROS:   Please see the history of present illness.    All other systems reviewed and are negative.   Labs/Other Tests and Data Reviewed:    Recent Labs: No results found for requested labs within last 8760 hours.   Recent Lipid Panel Lab Results  Component Value Date/Time   CHOL 190 02/14/2016 10:36 AM   CHOL 243 (H) 03/15/2013 08:31 AM   TRIG 128 02/14/2016 10:36 AM   TRIG 245 (H) 03/15/2013 08:31 AM   HDL 55 02/14/2016 10:36 AM   HDL 53 03/15/2013 08:31 AM   CHOLHDL 3.5 02/14/2016 10:36 AM   CHOLHDL 2.7 07/11/2015 07:48 AM   LDLCALC 109 (H) 02/14/2016 10:36 AM   LDLCALC 141 (H) 03/15/2013 08:31 AM   LDLDIRECT 98.0 06/28/2014 07:51 AM    Wt Readings from Last 3 Encounters:  06/14/18 230 lb (104.3 kg)  02/03/18 238 lb 6.4 oz (108.1 kg)  01/26/18 241 lb 12.8 oz (109.7 kg)     Objective:    Vital Signs:  Ht 6\' 2"  (1.88 m)   Wt 230 lb (104.3 kg)   BMI 29.53 kg/m     ASSESSMENT & PLAN:    1.  OSA - he is intolerant to CPAP due to not tolerating the mask.   He would be amenable to  repeat sleep study and get back on PAP therapy.    2.  ASCAD - s/p PCI mild LAD (BMS) in setting of AWMI complicated by Vfib arrest with 40-50% residual disesae in left circ.  He has not had any anginal sx.  He will continue on ASA 81mg  dailly, BB.  He is intolerant to statins.    3.  Hyperlipidemia - his LDL goal is < 70.  He will continue on Zetia  10mg  daily and fenofibrate 160mg  daily.  He is statin intolerant.  He is in a lipid study.  4.  Ischemic DCM - his last EF assessment was in 2017 by echo with EF 30-35% s/p AICD after vfib arrest and this is followed in device clinic by Dr. Lovena Le.   5.  Chronic systolic CHF - he has not had any SOB or LE edema.  He thinks that he is not volume overloaded.  His weight is stable.  He will continue on carvedilol 12.5mg  BID and Losartan 50mg  daily.  He has not required any diuretics.   I will get a BMET in July once COVID has settled down.    6.  COVID-19 Education:The signs and symptoms of COVID-19 were discussed with the patient and how to seek care for testing (follow up with PCP or arrange E-visit).  The importance of social distancing was discussed today.  Patient Risk:   After full review of this patient's clinical status, I feel that they are at least moderate risk at this time.  Time:   Today, I have spent 12 minutes directly with the patient on telephone discussing medical problems including OSA, CAD, CHF, DCM, lipids.  We also reviewed the symptoms of COVID 19 and the ways to protect against contracting the virus with telehealth technology.  I spent an additional 5 minutes reviewing patient's chart including prior OV notes.  Medication Adjustments/Labs and Tests Ordered: Current medicines are reviewed at length with the patient today.  Concerns regarding medicines are outlined above.  Tests Ordered: No orders of the defined types were placed in this encounter.  Medication Changes: No orders of the defined types were placed in this  encounter.   Disposition:  Follow up in 1 year(s)  Signed, Fransico Him, MD  06/14/2018 8:42 AM    [facility]

## 2018-06-14 ENCOUNTER — Telehealth (INDEPENDENT_AMBULATORY_CARE_PROVIDER_SITE_OTHER): Payer: Medicare HMO | Admitting: Cardiology

## 2018-06-14 ENCOUNTER — Other Ambulatory Visit: Payer: Self-pay

## 2018-06-14 ENCOUNTER — Encounter: Payer: Self-pay | Admitting: Cardiology

## 2018-06-14 VITALS — Ht 74.0 in | Wt 230.0 lb

## 2018-06-14 DIAGNOSIS — I5022 Chronic systolic (congestive) heart failure: Secondary | ICD-10-CM | POA: Diagnosis not present

## 2018-06-14 DIAGNOSIS — Z95810 Presence of automatic (implantable) cardiac defibrillator: Secondary | ICD-10-CM

## 2018-06-14 DIAGNOSIS — I2510 Atherosclerotic heart disease of native coronary artery without angina pectoris: Secondary | ICD-10-CM

## 2018-06-14 DIAGNOSIS — E785 Hyperlipidemia, unspecified: Secondary | ICD-10-CM

## 2018-06-14 DIAGNOSIS — I255 Ischemic cardiomyopathy: Secondary | ICD-10-CM

## 2018-06-14 DIAGNOSIS — I10 Essential (primary) hypertension: Secondary | ICD-10-CM

## 2018-06-14 DIAGNOSIS — G4733 Obstructive sleep apnea (adult) (pediatric): Secondary | ICD-10-CM

## 2018-06-14 NOTE — Patient Instructions (Signed)
Medication Instructions:  Your physician recommends that you continue on your current medications as directed. Please refer to the Current Medication list given to you today.  If you need a refill on your cardiac medications before your next appointment, please call your pharmacy.   Lab work: BMET: On 07/27/18   If you have labs (blood work) drawn today and your tests are completely normal, you will receive your results only by: Marland Kitchen MyChart Message (if you have MyChart) OR . A paper copy in the mail If you have any lab test that is abnormal or we need to change your treatment, we will call you to review the results.  Testing/Procedures: [place] Sleep study  Follow-Up: At [facility], you and your health needs are our priority.  As part of our continuing mission to provide you with exceptional heart care, we have created designated Provider Care Teams.  These Care Teams include your primary Cardiologist (physician) and Advanced Practice Providers (APPs -  Physician Assistants and Nurse Practitioners) who all work together to provide you with the care you need, when you need it. You will need a follow up appointment in 1 years.  Please call our office 2 months in advance to schedule this appointment.  You may see Fransico Him, MD or one of the following Advanced Practice Providers on your designated Care Team:   [place], PA-C Melina Copa, PA-C . Ermalinda Barrios, PA-C

## 2018-06-14 NOTE — Progress Notes (Signed)
Patient Name: Joe [name]         DOB: 1955-10-28      Height: 6\' 2"     Weight: 230 lbs  Office Name:[facility].         Referring Provider: Dr. Fransico Him  Date:  06/14/18 STOP BANG RISK ASSESSMENT S (snore) Have you been told that you snore?     YES   T (tired) Are you often tired, fatigued, or sleepy during the day?   NO  O (obstruction) Do you stop breathing, choke, or gasp during sleep? NO   P (pressure) Do you have or are you being treated for high blood pressure? NO   B (BMI) Is your body index greater than 35 kg/m? NO   A (age) Are you 40 years old or older? YES   N (neck) Do you have a neck circumference greater than 16 inches?   N/A   G (gender) Are you a male? YES   TOTAL STOP/BANG "YES" ANSWERS 3                                                                       For Office Use Only              Procedure Order Form    YES to 3+ Stop Bang questions OR two clinical symptoms - patient qualifies for WatchPAT (CPT 95800)     Submit: This Form + Patient Face Sheet + Clinical Note via CloudPAT or Fax: [phone]         Clinical Notes: Will consult Sleep Specialist and refer for management of therapy due to patient increased risk of Sleep Apnea. Ordering a sleep study due to the following two clinical symptoms: Excessive daytime sleepiness G47.10 / Gastroesophageal reflux K21.9 / Nocturia R35.1 / Morning Headaches G44.221 / Difficulty concentrating R41.840 / Memory problems or poor judgment G31.84 / Personality changes or irritability R45.4 / Loud snoring R06.83 / Depression F32.9 / Unrefreshed by sleep G47.8 / Impotence N52.9 / History of high blood pressure R03.0 / Insomnia G47.00    I understand that I am proceeding with a home sleep apnea test as ordered by my treating physician. I understand that untreated sleep apnea is a serious cardiovascular risk factor and it is my responsibility to perform the test and seek management for sleep apnea. I  will be contacted with the results and be managed for sleep apnea by a local sleep physician. I will be receiving equipment and further instructions from [facility]. I shall promptly ship back the equipment via the included mailing label. I understand my insurance will be billed for the test and as the patient I am responsible for any insurance related out-of-pocket costs incurred. I have been provided with written instructions and can call for additional video or telephonic instruction, with 24-hour availability of qualified personnel to answer any questions: Patient Help Desk [phone] Date_______6/1/20__________ Patient Telemedicine Verbal Consent

## 2018-06-30 ENCOUNTER — Ambulatory Visit (INDEPENDENT_AMBULATORY_CARE_PROVIDER_SITE_OTHER): Payer: Medicare HMO | Admitting: *Deleted

## 2018-06-30 DIAGNOSIS — I255 Ischemic cardiomyopathy: Secondary | ICD-10-CM

## 2018-07-01 ENCOUNTER — Telehealth: Payer: Self-pay

## 2018-07-01 LAB — CUP PACEART REMOTE DEVICE CHECK
Battery Remaining Longevity: 42 mo
Battery Remaining Percentage: 36 %
Brady Statistic RV Percent Paced: 0 %
Date Time Interrogation Session: 20200618145443
HighPow Impedance: 77 Ohm
Implantable Lead Implant Date: 20121227
Implantable Lead Location: 753860
Implantable Lead Model: 7122
Implantable Pulse Generator Implant Date: 20121227
Lead Channel Impedance Value: 460 Ohm
Lead Channel Sensing Intrinsic Amplitude: 12 mV
Lead Channel Setting Pacing Amplitude: 2.5 V
Lead Channel Setting Pacing Pulse Width: 0.5 ms
Pulse Gen Serial Number: 1037513

## 2018-07-01 NOTE — Telephone Encounter (Signed)
Left message for patient to remind of missed remote transmission.  

## 2018-07-12 ENCOUNTER — Encounter: Payer: Self-pay | Admitting: Cardiology

## 2018-07-12 NOTE — Progress Notes (Signed)
Remote ICD transmission.   

## 2018-07-27 ENCOUNTER — Other Ambulatory Visit: Payer: Medicare HMO

## 2018-08-04 ENCOUNTER — Other Ambulatory Visit: Payer: Self-pay

## 2018-08-04 ENCOUNTER — Encounter: Payer: Medicare HMO | Admitting: *Deleted

## 2018-08-04 VITALS — BP 142/88 | HR 61 | Temp 97.8°F | Resp 20 | Wt 238.6 lb

## 2018-08-04 DIAGNOSIS — Z006 Encounter for examination for normal comparison and control in clinical research program: Secondary | ICD-10-CM

## 2018-08-04 NOTE — Research (Signed)
Patient to clinic for visit 3 day 270 in the [place] 8 research study.  Injection given and patient stayed >30 minutes for observation.  Patient tolerated well and next clinic visit scheduled.

## 2018-08-12 ENCOUNTER — Telehealth: Payer: Self-pay

## 2018-08-12 NOTE — Telephone Encounter (Signed)
Mailed in application for zetia, per patient request

## 2018-09-15 ENCOUNTER — Telehealth: Payer: Self-pay

## 2018-09-15 NOTE — Telephone Encounter (Signed)
Patient states he received the equipment for the sleep study, however he hasn't completed the study due to Covid and other reasons.  Stated he is planning to do the sleep study. Writer will notify [employer].

## 2018-09-15 NOTE — Progress Notes (Signed)
This encounter was created in error - please disregard.

## 2018-09-17 NOTE — Procedures (Signed)
erroroneous encounter

## 2018-09-29 ENCOUNTER — Encounter: Payer: Medicare HMO | Admitting: *Deleted

## 2018-10-05 ENCOUNTER — Encounter: Payer: Self-pay | Admitting: Cardiology

## 2018-10-26 DIAGNOSIS — Z23 Encounter for immunization: Secondary | ICD-10-CM | POA: Diagnosis not present

## 2018-11-09 ENCOUNTER — Telehealth: Payer: Self-pay | Admitting: *Deleted

## 2018-11-09 DIAGNOSIS — Z006 Encounter for examination for normal comparison and control in clinical research program: Secondary | ICD-10-CM

## 2018-11-09 NOTE — Telephone Encounter (Signed)
Patient called the Research clinic re: a text message he received re: his defibrillator.  He stated that he has been having problems with the transmissions recently.  I called the device clinic at [facility] and spoke with Maudie Mercury. She will be programming for an automatic transmission in the morning 11/10/2018.  She also gave me her direct number to have him call her and verify that the signal was received.  I gave the message to the patient, he verbalized understanding, said that he felt fine and would call 911 if needed assistance.

## 2018-11-10 ENCOUNTER — Encounter: Payer: Medicare HMO | Admitting: *Deleted

## 2018-11-29 ENCOUNTER — Telehealth: Payer: Self-pay

## 2018-11-29 NOTE — Telephone Encounter (Signed)
PATIENT CALLED REQUESTED A REFILL ON HIS ZETIA, I CALLED [employer] [facility] TO REQ HIS ZETIAREFILL

## 2019-01-09 ENCOUNTER — Other Ambulatory Visit: Payer: Self-pay | Admitting: Internal Medicine

## 2019-01-18 ENCOUNTER — Telehealth: Payer: Self-pay | Admitting: *Deleted

## 2019-01-18 NOTE — Telephone Encounter (Signed)
-----   Message from Sueanne Margarita, MD sent at 01/13/2019  5:47 PM EST ----- Regarding: RE: Sleep Study Gae Bon,  Please find out why patient has not followed up on his sleep study  [place] ----- Message ----- From: Chapman Moss, RN Sent: 01/13/2019  11:06 AM EST To: Sueanne Margarita, MD Subject: Sleep Study                                    Hi Dr Radford Pax,  I think you ordered this one as well.  Thank you ----- Message ----- From: Sueanne Margarita, MD Sent: 01/12/2019  11:27 AM EST To: Chapman Moss, RN Subject: RE: Jaynie Crumble Sleep Study                         Please let ordering MD know ----- Message ----- From: Chapman Moss, RN Sent: 01/12/2019  11:03 AM EST To: Sueanne Margarita, MD Subject: [place] Sleep Study                             Hi Turner,  I am working on the sleep study queue.  I spoke with this patient in September.  At that time he indicated he had the equipment, but had not completed the study.    He hasn't completed the study despite saying he would take the test.  Jaynie Crumble has removed him, is it ok if I cancel his study from the work queue and we can reorder if needed.  Regards,  Con Memos

## 2019-01-18 NOTE — Telephone Encounter (Signed)
Reached out to patient who states he just have not gotten around to taking the test. Patient has been encouraged to take the test and send his results in. He promises to take the test soon and call our office when he does.

## 2019-01-26 ENCOUNTER — Other Ambulatory Visit: Payer: Self-pay

## 2019-01-26 ENCOUNTER — Encounter: Payer: Medicare HMO | Admitting: *Deleted

## 2019-01-26 DIAGNOSIS — Z006 Encounter for examination for normal comparison and control in clinical research program: Secondary | ICD-10-CM

## 2019-01-26 NOTE — Research (Signed)
Patient to research clinic for visit Day 450 in the [place] study.  No aes or saes to report. Injection given, tolerated well, stayed protocol defined amount of time. Next visit scheduled.

## 2019-02-10 ENCOUNTER — Telehealth: Payer: Self-pay

## 2019-02-10 NOTE — Telephone Encounter (Signed)
Left message for patient to remind of missed remote transmission.  

## 2019-02-21 ENCOUNTER — Other Ambulatory Visit: Payer: Self-pay | Admitting: Physician Assistant

## 2019-02-21 ENCOUNTER — Telehealth: Payer: Self-pay

## 2019-02-21 NOTE — Telephone Encounter (Signed)
PATIENT CALLED TO REQUEST A REFILL ON HIS ZETIA AND CONCERNS ABOUT WHAT WAS GOING TO HAPPEN WHEN THE [place] HELPING WITH ZETIA

## 2019-03-04 ENCOUNTER — Other Ambulatory Visit: Payer: Self-pay | Admitting: Cardiology

## 2019-03-04 MED ORDER — FENOFIBRATE 160 MG PO TABS: ORAL_TABLET | ORAL | 0 refills | Status: DC

## 2019-03-04 NOTE — Telephone Encounter (Signed)
Pt's medication was sent to pt's pharmacy as requested. Confirmation received.  °

## 2019-03-07 ENCOUNTER — Telehealth: Payer: Self-pay | Admitting: Cardiology

## 2019-03-07 NOTE — Telephone Encounter (Signed)
Encounter not needed

## 2019-03-07 NOTE — Telephone Encounter (Signed)
New message:    Patient calling stating that he would like to speak to some one concering greeting off of some of his medication.

## 2019-03-07 NOTE — Telephone Encounter (Signed)
Patient states that he is in a research study for [employer]. He was told that he was doing really well and his lab work looked good. It was suggested that he could possibly come off of some of his cholesterol medications so he is calling to inquire if that would be okay.  I do not see any recent lab work in his chart but will try to get it faxed over for review.

## 2019-03-08 ENCOUNTER — Telehealth: Payer: Self-pay | Admitting: *Deleted

## 2019-03-08 NOTE — Telephone Encounter (Signed)
Reached out to patient about taking the in lab sleep study and patient states he will think about it and let us know a a later date but he is not having any trouble sleeping.

## 2019-03-08 NOTE — Telephone Encounter (Signed)
-----   Message from Chapman Moss, RN sent at 03/07/2019  3:00 PM EST ----- Regarding: [place] sleep study Hi Dr Radford Pax,  Per report received from [facility] patient's insurance is not contracted with them.  He has had several calls requesting to complete sleep study.  Pt will need inlab study.  I am going to cancel the order for [place] to help clean up the work queue.  Thank you,  Con Memos

## 2019-05-12 ENCOUNTER — Telehealth: Payer: Self-pay

## 2019-05-12 NOTE — Telephone Encounter (Signed)
Spoke with patient to remind of missed remote transmission 

## 2019-05-23 ENCOUNTER — Ambulatory Visit: Payer: Medicare HMO | Attending: Internal Medicine

## 2019-06-03 ENCOUNTER — Telehealth: Payer: Self-pay

## 2019-06-03 ENCOUNTER — Other Ambulatory Visit: Payer: Self-pay | Admitting: Cardiology

## 2019-06-03 NOTE — Telephone Encounter (Signed)
MR. KRISINSK CALLED TO GET A REFILL ON HIS ZETIA. I CALLED THE [facility] TO GET A REFILL OF HIS ZETIA. WILL NEED A NEW APPLICATION DONE IN [place]

## 2019-06-22 ENCOUNTER — Encounter: Payer: Medicare HMO | Admitting: *Deleted

## 2019-06-22 ENCOUNTER — Other Ambulatory Visit: Payer: Self-pay

## 2019-06-22 DIAGNOSIS — Z006 Encounter for examination for normal comparison and control in clinical research program: Secondary | ICD-10-CM

## 2019-06-22 NOTE — Research (Signed)
Patient to research clinic for visit [phone] in the [place] study.  No aes or saes to report. Injection given, tolerated well, stayed protocol defined amount of time. Next visit scheduled

## 2019-08-31 ENCOUNTER — Other Ambulatory Visit: Payer: Self-pay | Admitting: Cardiology

## 2019-09-06 ENCOUNTER — Telehealth: Payer: Self-pay | Admitting: Internal Medicine

## 2019-09-06 NOTE — Telephone Encounter (Signed)
Advised patient that we recommend that patient's receive whichever vaccine is available to them. Patient verbalized understanding.

## 2019-09-06 NOTE — Telephone Encounter (Signed)
Patient is wanting to know which vaccine he should take, considering his heart condition and him having a defibrillator. Please call back.

## 2019-09-09 ENCOUNTER — Telehealth: Payer: Self-pay | Admitting: Internal Medicine

## 2019-09-09 DIAGNOSIS — E785 Hyperlipidemia, unspecified: Secondary | ICD-10-CM

## 2019-09-09 DIAGNOSIS — I2510 Atherosclerotic heart disease of native coronary artery without angina pectoris: Secondary | ICD-10-CM

## 2019-09-09 NOTE — Telephone Encounter (Signed)
Patient called and wanted to talk with Dr. Tanna Furry nurse about medication

## 2019-09-09 NOTE — Telephone Encounter (Signed)
Pt states that he needs a refill of his medication zetia. But his pharmacy doesn't do it any longer and it's expensive and he did it as a trial. He wants to know if it can be looked into so he can come off of some of his medications.

## 2019-09-11 NOTE — Telephone Encounter (Signed)
Needs to stay on fenofibrate and Zetia as he is statin intolerant.  He also needs to come in for an FLP and ALT

## 2019-09-13 NOTE — Telephone Encounter (Signed)
Left message for patient with recommendations from Dr. Radford Pax. I have placed orders for lab work.

## 2019-10-05 ENCOUNTER — Ambulatory Visit: Payer: Medicare HMO | Admitting: Internal Medicine

## 2019-10-05 ENCOUNTER — Other Ambulatory Visit: Payer: Self-pay | Admitting: Cardiology

## 2019-10-05 ENCOUNTER — Encounter: Payer: Self-pay | Admitting: Internal Medicine

## 2019-10-05 ENCOUNTER — Other Ambulatory Visit: Payer: Self-pay

## 2019-10-05 VITALS — BP 142/84 | HR 65 | Ht 74.0 in | Wt 241.8 lb

## 2019-10-05 DIAGNOSIS — I10 Essential (primary) hypertension: Secondary | ICD-10-CM

## 2019-10-05 DIAGNOSIS — Z95810 Presence of automatic (implantable) cardiac defibrillator: Secondary | ICD-10-CM

## 2019-10-05 DIAGNOSIS — I255 Ischemic cardiomyopathy: Secondary | ICD-10-CM

## 2019-10-05 DIAGNOSIS — E785 Hyperlipidemia, unspecified: Secondary | ICD-10-CM

## 2019-10-05 DIAGNOSIS — I5022 Chronic systolic (congestive) heart failure: Secondary | ICD-10-CM | POA: Diagnosis not present

## 2019-10-05 LAB — CUP PACEART INCLINIC DEVICE CHECK
Battery Remaining Longevity: 34 mo
Brady Statistic RV Percent Paced: 0.12 %
Date Time Interrogation Session: 20210922085934
HighPow Impedance: 70.875
Implantable Lead Implant Date: 20121227
Implantable Lead Location: 753860
Implantable Lead Model: 7122
Implantable Pulse Generator Implant Date: 20121227
Lead Channel Impedance Value: 450 Ohm
Lead Channel Pacing Threshold Amplitude: 1.25 V
Lead Channel Pacing Threshold Amplitude: 1.25 V
Lead Channel Pacing Threshold Pulse Width: 0.5 ms
Lead Channel Pacing Threshold Pulse Width: 0.5 ms
Lead Channel Sensing Intrinsic Amplitude: 11.8 mV
Lead Channel Setting Pacing Amplitude: 2.5 V
Lead Channel Setting Pacing Pulse Width: 0.5 ms
Lead Channel Setting Sensing Sensitivity: 0.5 mV
Pulse Gen Serial Number: 1037513

## 2019-10-05 LAB — LIPID PANEL
Chol/HDL Ratio: 2 ratio (ref 0.0–5.0)
Cholesterol, Total: 110 mg/dL (ref 100–199)
HDL: 55 mg/dL (ref >39)
LDL Chol Calc (NIH): 28 mg/dL (ref 0–99)
Triglycerides: 165 mg/dL — ABNORMAL HIGH (ref 0–149)
VLDL Cholesterol Cal: 27 mg/dL (ref 5–40)

## 2019-10-05 NOTE — Progress Notes (Signed)
HPI Joe [name] returns today for ongoing evaluation and management of chronic systolic heart failure, HTN, s/p ICD insertion. He has done well in the interim with no chest pain or sob. No ICD shocks.he remains active. He has been participating in a cholesterol lowering study but we have no access to his fasting lipids.  Allergies  Allergen Reactions   Isosorbide Dinitrate Other (See Comments)    headache  headache   Rosuvastatin Other (See Comments)    Muscle aches on 5 mg qd and 20 mg qd Muscle aches on 5 mg qd and 20 mg qd Muscle aches on 5 mg qd and 20 mg qd   Atorvastatin Anxiety    Made his irritable and anxious Made his irritable and anxious     Current Outpatient Medications  Medication Sig Dispense Refill   AMBULATORY NON FORMULARY MEDICATION Inject 300 mg into the skin as directed. Medication Name: Inclisiran sodium vs placebo     aspirin 81 MG tablet Take 81 mg by mouth daily.       carvedilol (COREG) 12.5 MG tablet TAKE 1 TABLET BY MOUTH TWICE DAILY WITH MEALS 180 tablet 2   ezetimibe (ZETIA) 10 MG tablet Take 1 tablet (10 mg total) by mouth daily. 10 tablet 0   fenofibrate 160 MG tablet TAKE 1 TABLET BY MOUTH DAILY. Please make overdue appt with Dr. Radford Pax before anymore refills. 1st attempt 30 tablet 0   fish oil-omega-3 fatty acids 1000 MG capsule Take 4 g by mouth daily.      losartan (COZAAR) 50 MG tablet TAKE 1 TABLET(50 MG) BY MOUTH DAILY 30 tablet 0   nitroGLYCERIN (NITROSTAT) 0.4 MG SL tablet Place 1 tablet (0.4 mg total) under the tongue every 5 (five) minutes as needed for chest pain. Up to 3 doses 25 tablet 3   No current facility-administered medications for this visit.     Past Medical History:  Diagnosis Date   Chronic systolic CHF (congestive heart failure), NYHA class 1 (HCC)    EF 37% by nuclear stress test   Coronary artery disease    s/p PCI mild LAD (BMS) in setting of AWMI complicated by Vfib arrest with 40-50% residual  disesae in left circ   Hyperlipidemia    Ischemic cardiomyopathy    A. 01/09/2011 - s/p St. Jude Fortify ST VR [place] AICD   OSA (obstructive sleep apnea)    severe with AHI 26/hr intolerant to CPAP   VF (ventricular fibrillation) ([place])    arrest in setting of AMI 6/11    ROS:   All systems reviewed and negative except as noted in the HPI.   Past Surgical History:  Procedure Laterality Date   BACK SURGERY     BACK SURGERY     IMPLANTABLE CARDIOVERTER DEFIBRILLATOR IMPLANT N/A 01/09/2011   Procedure: IMPLANTABLE CARDIOVERTER DEFIBRILLATOR IMPLANT;  Surgeon: Evans Lance, MD;  Location: [facility] CATH LAB;  Service: Cardiovascular;  Laterality: N/A;   KNEE ARTHROSCOPY     right   PACEMAKER INSERTION  2012   St Jude     Family History  Problem Relation Age of Onset   Aneurysm Mother      Social History   Socioeconomic History   Marital status: Single    Spouse name: Not on file   Number of children: Not on file   Years of education: Not on file   Highest education level: Not on file  Occupational History   Not on file  Tobacco Use   Smoking status: Current Every Day Smoker    Packs/day: 1.00    Years: 34.00    Pack years: 34.00    Types: Cigarettes    Last attempt to quit: 06/13/2009    Years since quitting: 10.3   Smokeless tobacco: Never Used   Tobacco comment: Socially  Substance and Sexual Activity   Alcohol use: Yes    Comment: occasional   Drug use: No   Sexual activity: Yes  Other Topics Concern   Not on file  Social History Narrative   Not on file   Social Determinants of Health   Financial Resource Strain:    Difficulty of Paying Living Expenses: Not on file  Food Insecurity:    Worried About Charity fundraiser in the Last Year: Not on file   [employer] of Food in the Last Year: Not on file  Transportation Needs:    Lack of Transportation (Medical): Not on file   Lack of Transportation (Non-Medical): Not on file  Physical  Activity:    Days of Exercise per Week: Not on file   Minutes of Exercise per Session: Not on file  Stress:    Feeling of Stress : Not on file  Social Connections:    Frequency of Communication with Friends and Family: Not on file   Frequency of Social Gatherings with Friends and Family: Not on file   Attends Religious Services: Not on file   Active Member of Clubs or Organizations: Not on file   Attends Archivist Meetings: Not on file   Marital Status: Not on file  Intimate Partner Violence:    Fear of Current or Ex-Partner: Not on file   Emotionally Abused: Not on file   Physically Abused: Not on file   Sexually Abused: Not on file     BP (!) 142/84    Pulse 65    Ht 6\' 2"  (1.88 m)    Wt 241 lb 12.8 oz (109.7 kg)    SpO2 94%    BMI 31.05 kg/m   Physical Exam:  Well appearing NAD HEENT: Unremarkable Neck:  No JVD, no thyromegally Lymphatics:  No adenopathy Back:  No CVA tenderness Lungs:  Clear with no wheezes HEART:  Regular rate rhythm, no murmurs, no rubs, no clicks Abd:  soft, positive bowel sounds, no organomegally, no rebound, no guarding Ext:  2 plus pulses, no edema, no cyanosis, no clubbing Skin:  No rashes no nodules Neuro:  CN II through XII intact, motor grossly intact  EKG - nsr with AS MI  DEVICE  Normal device function.  See PaceArt for details.   Assess/Plan: 1. ICD - his St. Jude device is working normally. We will recheck in several months. 2. CAD - he is active and denies anginal symptoms. 3. Dyslipidemia - I will check fasting lipids today. He would like to stop the zetia due to cost. If his cholesterol looks good we will stop it. 4. Covid - he has been vaccinated. He has not been sick.  [name] Tad Fancher,MD

## 2019-10-05 NOTE — Patient Instructions (Addendum)
Medication Instructions:  Your physician recommends that you continue on your current medications as directed. Please refer to the Current Medication list given to you today.  Labwork: You will get lab work today:  lipids  Testing/Procedures: None ordered.  Follow-Up: Your physician wants you to follow-up in: one year with Dr. Lovena Le.   You will receive a reminder letter in the mail two months in advance. If you don't receive a letter, please call our office to schedule the follow-up appointment.  Remote monitoring is used to monitor your ICD from home. This monitoring reduces the number of office visits required to check your device to one time per year. It allows [place] to keep an eye on the functioning of your device to ensure it is working properly. You are scheduled for a device check from home on 11/28/2019. You may send your transmission at any time that day. If you have a wireless device, the transmission will be sent automatically. After your physician reviews your transmission, you will receive a postcard with your next transmission date.  Any Other Special Instructions Will Be Listed Below (If Applicable).  If you need a refill on your cardiac medications before your next appointment, please call your pharmacy.

## 2019-10-06 ENCOUNTER — Telehealth: Payer: Self-pay

## 2019-10-06 NOTE — Telephone Encounter (Signed)
Call placed to Pt.  Advised of lipid results.  Advised to stop ezetimibe.  Pt indicates understanding.

## 2019-10-06 NOTE — Telephone Encounter (Signed)
-----   Message from Evans Lance, MD sent at 10/05/2019  7:38 PM EDT ----- Let him know that his numbers are dramatically improved from what he showed me today. Continue current treatment except he can come off of zetia. GT

## 2019-10-08 ENCOUNTER — Other Ambulatory Visit: Payer: Self-pay | Admitting: Internal Medicine

## 2019-11-29 ENCOUNTER — Encounter: Payer: Medicare HMO | Admitting: *Deleted

## 2019-11-29 ENCOUNTER — Other Ambulatory Visit: Payer: Self-pay

## 2019-11-29 VITALS — BP 160/85 | HR 62 | Temp 97.3°F | Resp 16 | Wt 245.0 lb

## 2019-11-29 DIAGNOSIS — Z006 Encounter for examination for normal comparison and control in clinical research program: Secondary | ICD-10-CM

## 2019-11-29 NOTE — Research (Signed)
Subject came into research clinic today for [place] 8 Visit 6, Day 810. All concomitant medications have been reviewed and updated as applicable. No new AE's or SAE's to report to sponsor at this time.  Subject was given IP subcutaneous injection into left abdomen at 0820, remained in research clinic for 30 minutes afterwards, subject tolerated well. Subject left lab at 0850. Next appointment scheduled for May 17th, 2021 at 0800.

## 2020-01-24 ENCOUNTER — Ambulatory Visit (INDEPENDENT_AMBULATORY_CARE_PROVIDER_SITE_OTHER): Payer: Medicare HMO

## 2020-01-24 DIAGNOSIS — I255 Ischemic cardiomyopathy: Secondary | ICD-10-CM

## 2020-01-24 LAB — CUP PACEART REMOTE DEVICE CHECK
Battery Remaining Longevity: 30 mo
Battery Remaining Percentage: 25 %
Battery Voltage: 2.81 V
Brady Statistic RV Percent Paced: 1 %
Date Time Interrogation Session: 20220111065528
HighPow Impedance: 73 Ohm
HighPow Impedance: 73 Ohm
Implantable Lead Implant Date: 20121227
Implantable Lead Location: 753860
Implantable Lead Model: 7122
Implantable Pulse Generator Implant Date: 20121227
Lead Channel Impedance Value: 450 Ohm
Lead Channel Pacing Threshold Amplitude: 1.25 V
Lead Channel Pacing Threshold Pulse Width: 0.5 ms
Lead Channel Sensing Intrinsic Amplitude: 11.8 mV
Lead Channel Setting Pacing Amplitude: 2.5 V
Lead Channel Setting Pacing Pulse Width: 0.5 ms
Lead Channel Setting Sensing Sensitivity: 0.5 mV
Pulse Gen Serial Number: 1037513

## 2020-02-09 NOTE — Progress Notes (Signed)
Remote ICD transmission.   

## 2020-04-12 DIAGNOSIS — F17200 Nicotine dependence, unspecified, uncomplicated: Secondary | ICD-10-CM | POA: Diagnosis not present

## 2020-04-12 DIAGNOSIS — Z1211 Encounter for screening for malignant neoplasm of colon: Secondary | ICD-10-CM | POA: Diagnosis not present

## 2020-04-12 DIAGNOSIS — L538 Other specified erythematous conditions: Secondary | ICD-10-CM | POA: Diagnosis not present

## 2020-04-24 ENCOUNTER — Ambulatory Visit (INDEPENDENT_AMBULATORY_CARE_PROVIDER_SITE_OTHER): Payer: Medicare HMO

## 2020-04-24 DIAGNOSIS — I255 Ischemic cardiomyopathy: Secondary | ICD-10-CM

## 2020-04-24 LAB — CUP PACEART REMOTE DEVICE CHECK
Battery Remaining Longevity: 28 mo
Battery Remaining Percentage: 24 %
Battery Voltage: 2.8 V
Brady Statistic RV Percent Paced: 1 %
Date Time Interrogation Session: 20220412050329
HighPow Impedance: 72 Ohm
HighPow Impedance: 72 Ohm
Implantable Lead Implant Date: 20121227
Implantable Lead Location: 753860
Implantable Lead Model: 7122
Implantable Pulse Generator Implant Date: 20121227
Lead Channel Impedance Value: 430 Ohm
Lead Channel Pacing Threshold Amplitude: 1.25 V
Lead Channel Pacing Threshold Pulse Width: 0.5 ms
Lead Channel Sensing Intrinsic Amplitude: 11.8 mV
Lead Channel Setting Pacing Amplitude: 2.5 V
Lead Channel Setting Pacing Pulse Width: 0.5 ms
Lead Channel Setting Sensing Sensitivity: 0.5 mV
Pulse Gen Serial Number: 1037513

## 2020-04-30 DIAGNOSIS — L309 Dermatitis, unspecified: Secondary | ICD-10-CM | POA: Diagnosis not present

## 2020-04-30 DIAGNOSIS — R1011 Right upper quadrant pain: Secondary | ICD-10-CM | POA: Diagnosis not present

## 2020-04-30 DIAGNOSIS — R635 Abnormal weight gain: Secondary | ICD-10-CM | POA: Diagnosis not present

## 2020-05-03 DIAGNOSIS — K7689 Other specified diseases of liver: Secondary | ICD-10-CM | POA: Diagnosis not present

## 2020-05-03 DIAGNOSIS — R635 Abnormal weight gain: Secondary | ICD-10-CM | POA: Diagnosis not present

## 2020-05-03 DIAGNOSIS — K8020 Calculus of gallbladder without cholecystitis without obstruction: Secondary | ICD-10-CM | POA: Diagnosis not present

## 2020-05-03 DIAGNOSIS — R1011 Right upper quadrant pain: Secondary | ICD-10-CM | POA: Diagnosis not present

## 2020-05-03 DIAGNOSIS — K760 Fatty (change of) liver, not elsewhere classified: Secondary | ICD-10-CM | POA: Diagnosis not present

## 2020-05-03 DIAGNOSIS — N281 Cyst of kidney, acquired: Secondary | ICD-10-CM | POA: Diagnosis not present

## 2020-05-05 IMAGING — CT CT RENAL STONE PROTOCOL
2 of 4 series · 16 of 46 positions shown, 18 images · non-contrast
Comparison: None.

CLINICAL DATA: Left flank pain for several days which worsened
yesterday. Initial encounter.

EXAM:
CT ABDOMEN AND PELVIS WITHOUT CONTRAST
TECHNIQUE: Multidetector CT imaging of the abdomen and pelvis was performed
following the standard protocol without IV contrast.

[Series 2: axial st · axial · 0.82mm/px · z∈[-526,-71]mm · 13 of 99 slices shown, 15 images]
[im 4/99  soft-tissue]
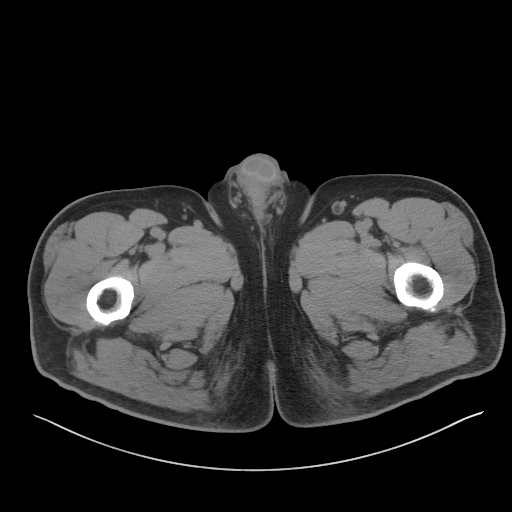
[im 4/99  bone]
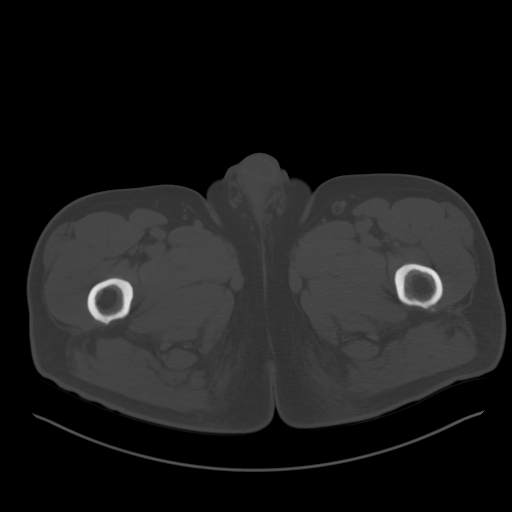
[im 12/99  soft-tissue]
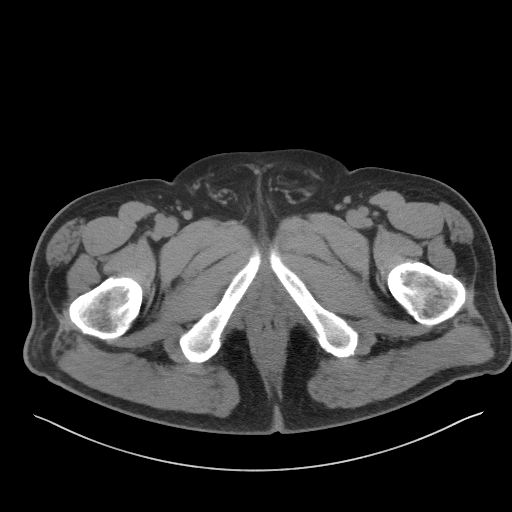
[im 19/99  soft-tissue]
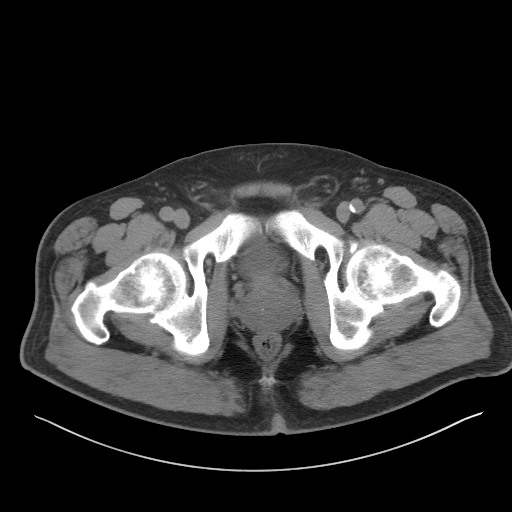
[im 27/99  soft-tissue]
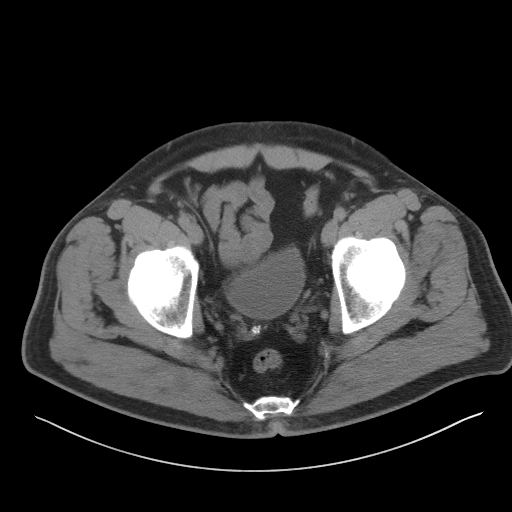
[im 34/99  soft-tissue]
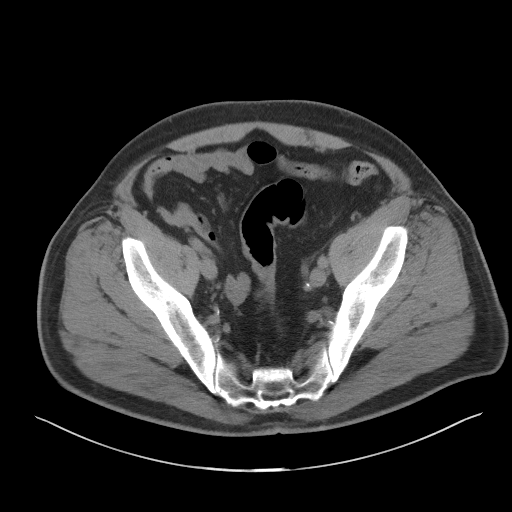
[im 42/99  soft-tissue]
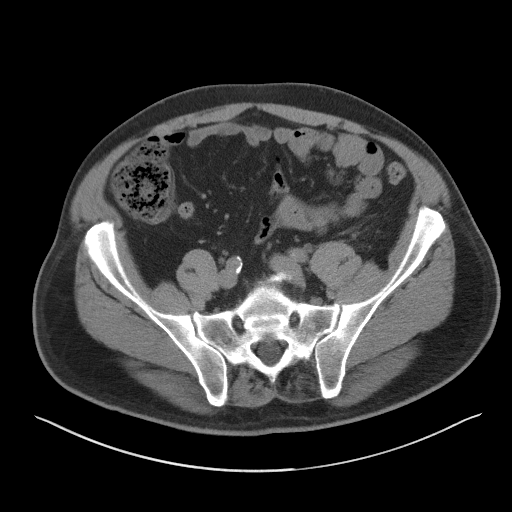
[im 50/99  soft-tissue]
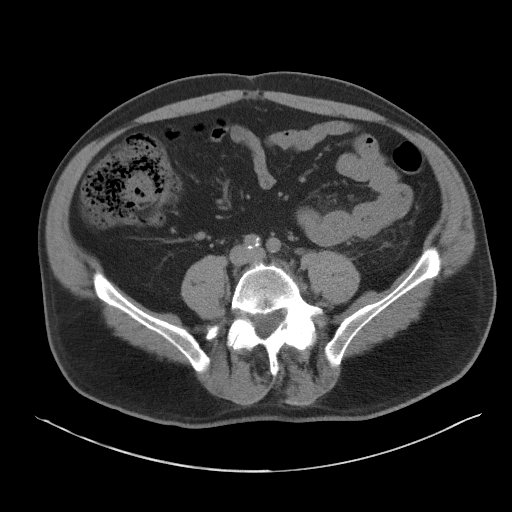
[im 57/99  soft-tissue]
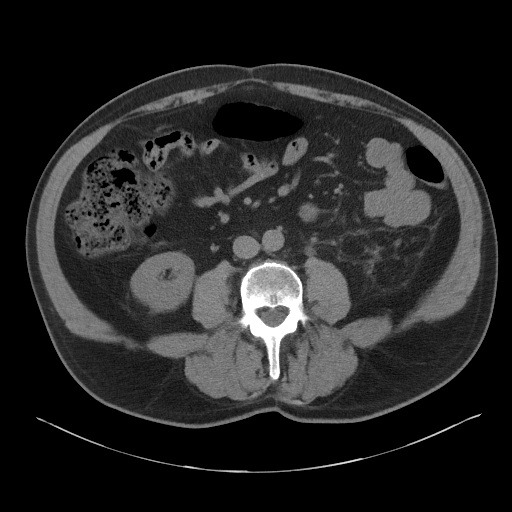
[im 65/99  soft-tissue]
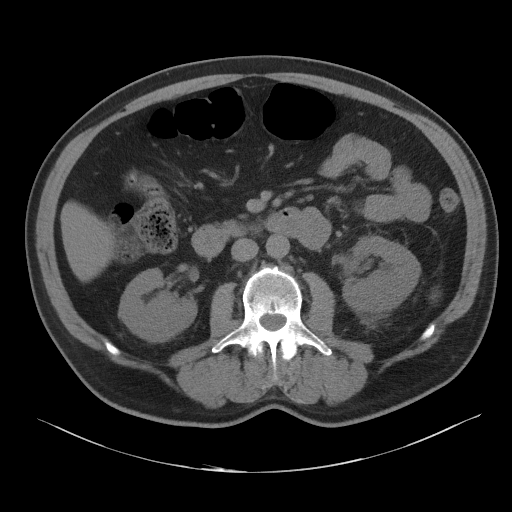
[im 65/99  bone]
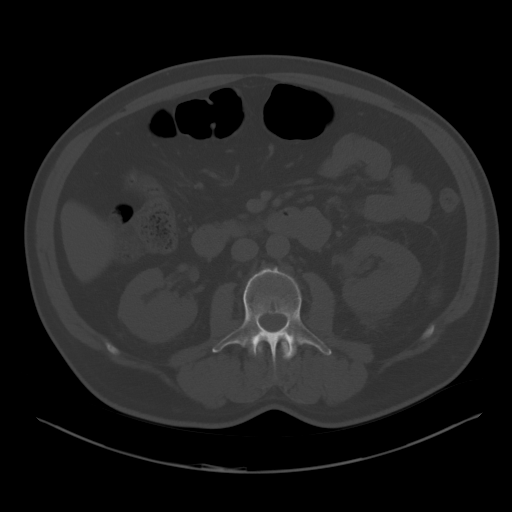
[im 72/99  soft-tissue]
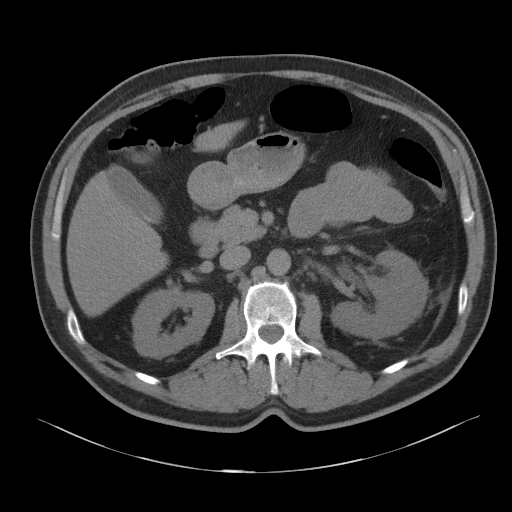
[im 80/99  soft-tissue]
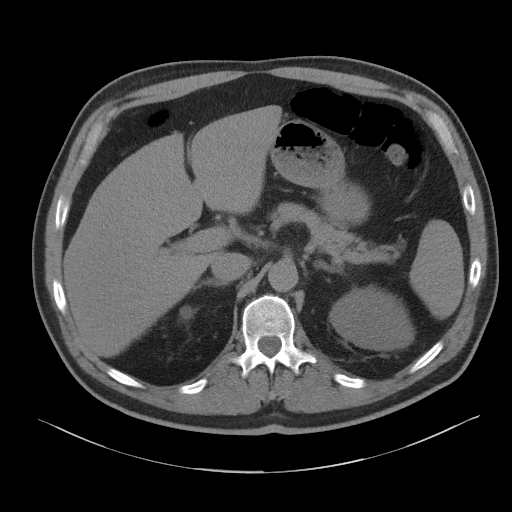
[im 87/99  soft-tissue]
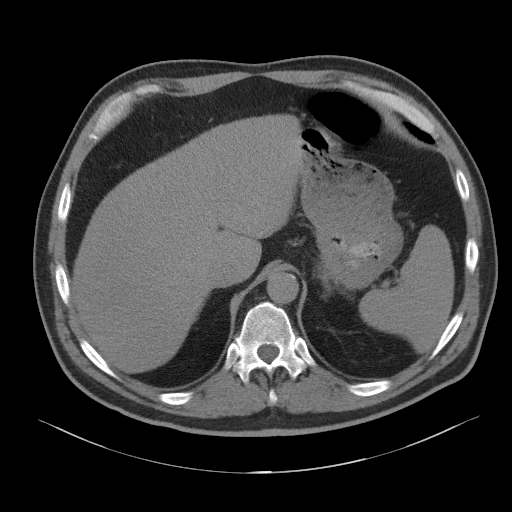
[im 95/99  soft-tissue]
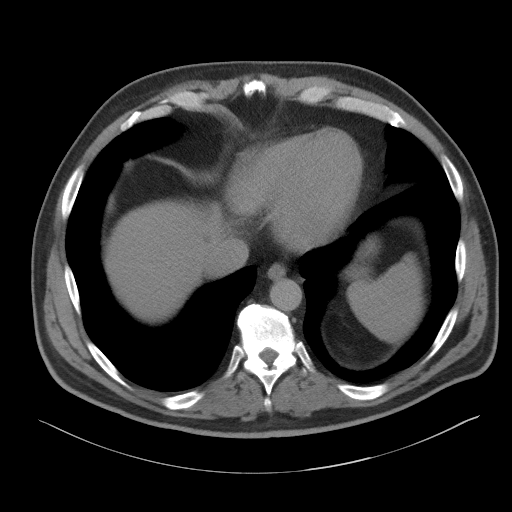

[Series 5: coronal st · coronal · 0.96mm/px · 3 of 101 slices shown]
[im 34/101  soft-tissue]
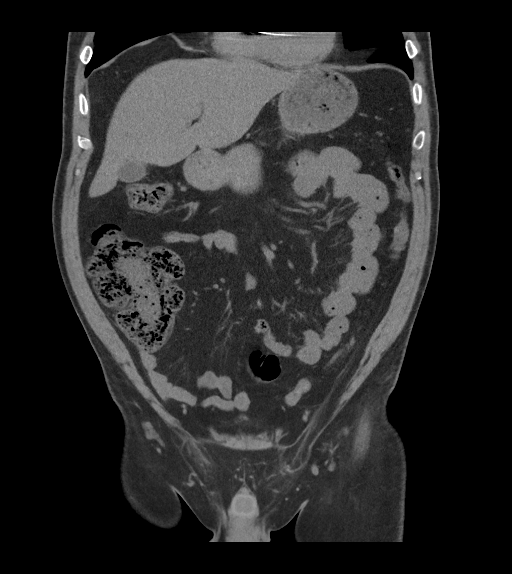
[im 45/101  soft-tissue]
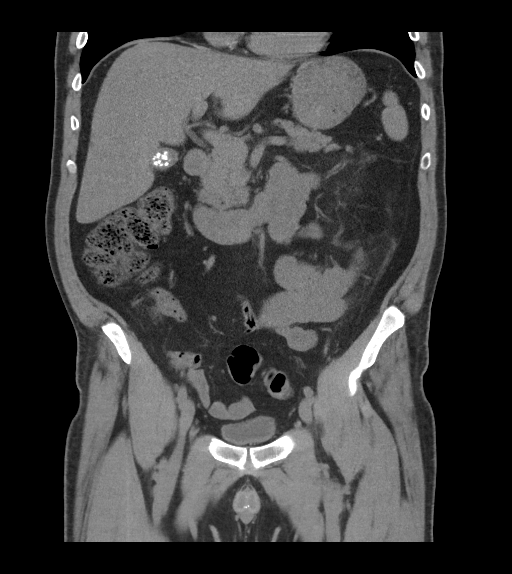
[im 56/101  soft-tissue]
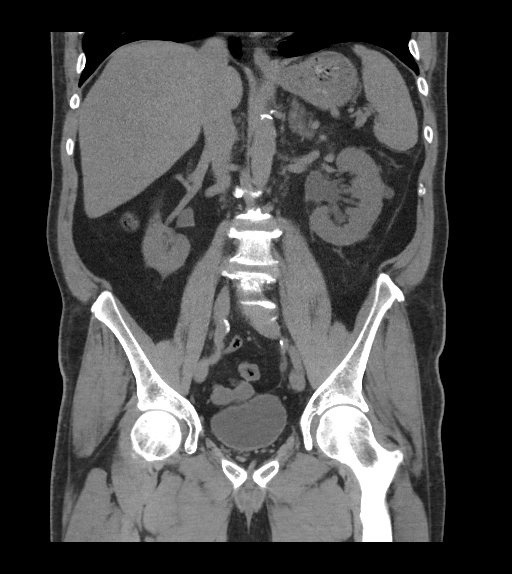

[16 of 46 positions shown; findings below may reference images not displayed]

FINDINGS: Lower chest: The lung bases are clear. No pleural or pericardial
effusion.

Hepatobiliary: Multiple small stones are seen within the gallbladder
but there is no evidence of cholecystitis. The liver is low
attenuating consistent with fatty infiltration. No focal lesion.
Biliary tree is unremarkable.

Pancreas: Unremarkable. No pancreatic ductal dilatation or
surrounding inflammatory changes.

Spleen: Normal in size without focal abnormality.

Adrenals/Urinary Tract: Mild to moderate left hydronephrosis and
stranding about the left kidney and ureter is due to a 1-2 mm stone
just proximal to the UVJ. No other urinary tract stones on the right
or left. Two simple left renal cysts are identified. The right
kidney is unremarkable. Adrenal glands appear normal.

Stomach/Bowel: Stomach is within normal limits. Appendix appears
normal. No evidence of bowel wall thickening, distention, or
inflammatory changes.

Vascular/Lymphatic: Aortic atherosclerosis. No enlarged abdominal or
pelvic lymph nodes.

Reproductive: Prostate is unremarkable.

Other: No ascites.  Small fat containing umbilical hernia noted.

Musculoskeletal: No fracture or focal lesion. Degenerative disc
disease L3-S1 noted.
IMPRESSION: Mild-to-moderate left hydronephrosis due to a 1-2 mm stone just
proximal to the left ureterovesical junction. No other urinary tract
stones. No other acute finding.

Gallstones without evidence cholecystitis.

Fatty infiltration of the liver.

Very small fat containing umbilical hernia.

## 2020-05-09 NOTE — Progress Notes (Signed)
Remote ICD transmission.   

## 2020-05-14 DIAGNOSIS — K8020 Calculus of gallbladder without cholecystitis without obstruction: Secondary | ICD-10-CM | POA: Diagnosis not present

## 2020-05-28 ENCOUNTER — Telehealth: Payer: Self-pay

## 2020-05-28 DIAGNOSIS — Z006 Encounter for examination for normal comparison and control in clinical research program: Secondary | ICD-10-CM

## 2020-05-28 NOTE — Telephone Encounter (Signed)
Calling to remind patient of his upcoming [place] 8 appointment. Patient verbalizes understanding of instructions, appt date/time.

## 2020-05-29 ENCOUNTER — Other Ambulatory Visit: Payer: Self-pay

## 2020-05-29 ENCOUNTER — Encounter: Payer: Medicare HMO | Admitting: *Deleted

## 2020-05-29 VITALS — BP 143/86 | HR 65 | Temp 97.6°F | Resp 18 | Wt 240.0 lb

## 2020-05-29 DIAGNOSIS — Z006 Encounter for examination for normal comparison and control in clinical research program: Secondary | ICD-10-CM

## 2020-05-29 NOTE — Research (Signed)
Patient came into the research lab today for Day 990 Visit for the [facility] While here the subject signed the most recent informed consent Version 03 Mar 2019.   Subject Name: Joe [name]  Subject met inclusion and exclusion criteria.  The informed consent form, study requirements and expectations were reviewed with the subject and questions and concerns were addressed prior to the signing of the consent form.  The subject verbalized understanding of the trial requirements.  The subject agreed to participate in the [place] 8 trial and signed the informed consent at 0807 on 05/29/20  The informed consent was obtained prior to performance of any protocol-specific procedures for the subject.  A copy of the signed informed consent was given to the subject and a copy was placed in the subject's medical record.   Preston Fleeting C   Subject's concomitant medications have been reviewed and updated if applicable. There are no new AE's or SAE's to report to sponsor at this time. Subject was given their IP injection subcutaneously into the right abdomen at 0824 and remained in the clinic for 30 minutes for observation. Subject tolerated well. Subject left the clinic at 0854. Next appointment was scheduled for Tuesday, October 18th, 2022 @ 0800.

## 2020-06-05 DIAGNOSIS — I252 Old myocardial infarction: Secondary | ICD-10-CM | POA: Diagnosis not present

## 2020-06-05 DIAGNOSIS — K8080 Other cholelithiasis without obstruction: Secondary | ICD-10-CM | POA: Diagnosis not present

## 2020-06-05 DIAGNOSIS — I10 Essential (primary) hypertension: Secondary | ICD-10-CM | POA: Diagnosis not present

## 2020-06-05 DIAGNOSIS — I509 Heart failure, unspecified: Secondary | ICD-10-CM | POA: Diagnosis not present

## 2020-06-05 DIAGNOSIS — Z951 Presence of aortocoronary bypass graft: Secondary | ICD-10-CM | POA: Diagnosis not present

## 2020-06-05 DIAGNOSIS — K429 Umbilical hernia without obstruction or gangrene: Secondary | ICD-10-CM | POA: Diagnosis not present

## 2020-06-05 DIAGNOSIS — G4733 Obstructive sleep apnea (adult) (pediatric): Secondary | ICD-10-CM | POA: Diagnosis not present

## 2020-06-05 DIAGNOSIS — E669 Obesity, unspecified: Secondary | ICD-10-CM | POA: Diagnosis not present

## 2020-06-05 DIAGNOSIS — I2510 Atherosclerotic heart disease of native coronary artery without angina pectoris: Secondary | ICD-10-CM | POA: Diagnosis not present

## 2020-06-05 DIAGNOSIS — K8020 Calculus of gallbladder without cholecystitis without obstruction: Secondary | ICD-10-CM | POA: Diagnosis not present

## 2020-06-05 DIAGNOSIS — Z6830 Body mass index (BMI) 30.0-30.9, adult: Secondary | ICD-10-CM | POA: Diagnosis not present

## 2020-06-05 DIAGNOSIS — Z950 Presence of cardiac pacemaker: Secondary | ICD-10-CM | POA: Diagnosis not present

## 2020-06-05 DIAGNOSIS — I110 Hypertensive heart disease with heart failure: Secondary | ICD-10-CM | POA: Diagnosis not present

## 2020-07-24 ENCOUNTER — Ambulatory Visit (INDEPENDENT_AMBULATORY_CARE_PROVIDER_SITE_OTHER): Payer: Medicare HMO

## 2020-07-24 DIAGNOSIS — I255 Ischemic cardiomyopathy: Secondary | ICD-10-CM | POA: Diagnosis not present

## 2020-07-24 LAB — CUP PACEART REMOTE DEVICE CHECK
Battery Remaining Longevity: 26 mo
Battery Remaining Percentage: 22 %
Battery Voltage: 2.8 V
Brady Statistic RV Percent Paced: 1 %
Date Time Interrogation Session: 20220712053615
HighPow Impedance: 62 Ohm
HighPow Impedance: 62 Ohm
Implantable Lead Implant Date: 20121227
Implantable Lead Location: 753860
Implantable Lead Model: 7122
Implantable Pulse Generator Implant Date: 20121227
Lead Channel Impedance Value: 400 Ohm
Lead Channel Pacing Threshold Amplitude: 1.25 V
Lead Channel Pacing Threshold Pulse Width: 0.5 ms
Lead Channel Sensing Intrinsic Amplitude: 11.8 mV
Lead Channel Setting Pacing Amplitude: 2.5 V
Lead Channel Setting Pacing Pulse Width: 0.5 ms
Lead Channel Setting Sensing Sensitivity: 0.5 mV
Pulse Gen Serial Number: 1037513

## 2020-08-16 NOTE — Progress Notes (Signed)
Remote ICD transmission.   

## 2020-10-02 ENCOUNTER — Other Ambulatory Visit: Payer: Self-pay | Admitting: Internal Medicine

## 2020-10-02 ENCOUNTER — Other Ambulatory Visit: Payer: Self-pay | Admitting: Cardiology

## 2020-10-03 ENCOUNTER — Other Ambulatory Visit: Payer: Self-pay

## 2020-10-03 VITALS — BP 144/75 | HR 61 | Wt 234.4 lb

## 2020-10-03 DIAGNOSIS — Z006 Encounter for examination for normal comparison and control in clinical research program: Secondary | ICD-10-CM

## 2020-10-03 NOTE — Research (Signed)
Subject came in today for his day 1080 EOS visit. Concomitant medications have been reviewed and updated if applicable. Since last visit, Pt stated he had his gallbladder removed on Memorial Day due to gallstones. Lab work was drawn and urine collected. Dr.Stuckey examined the pt and answered all his questions.

## 2020-10-08 ENCOUNTER — Other Ambulatory Visit: Payer: Self-pay | Admitting: Internal Medicine

## 2020-10-10 NOTE — Progress Notes (Signed)
Electrophysiology Office Note Date: 10/11/2020  ID:  Jaxsun, Ciampi 03-09-1955, MRN [id number]  PCP: Briscoe Deutscher, MD Primary Cardiologist: Fransico Him, MD Electrophysiologist: Cristopher Peru, MD   CC: Routine ICD follow-up  Joe [name] is a 65 y.o. male seen today for Cristopher Peru, MD for routine electrophysiology followup.  Since last being seen in our clinic the patient reports doing well. He recently finished the [place] study and is working to try and get the drug through pharmacy.  he denies chest pain, palpitations, dyspnea, PND, orthopnea, nausea, vomiting, dizziness, syncope, edema, weight gain, or early satiety. He has not had ICD shocks.   Device History: St. Jude Single Chamber ICD implanted 2012 for Chronic systolic CHF, ICM  Past Medical History:  Diagnosis Date   Chronic systolic CHF (congestive heart failure), NYHA class 1 (HCC)    EF 37% by nuclear stress test   Coronary artery disease    s/p PCI mild LAD (BMS) in setting of AWMI complicated by Vfib arrest with 40-50% residual disesae in left circ   Hyperlipidemia    Ischemic cardiomyopathy    A. 01/09/2011 - s/p St. Jude Fortify ST VR [place] AICD   OSA (obstructive sleep apnea)    severe with AHI 26/hr intolerant to CPAP   VF (ventricular fibrillation) ([place])    arrest in setting of AMI 6/11   Past Surgical History:  Procedure Laterality Date   BACK SURGERY     BACK SURGERY     IMPLANTABLE CARDIOVERTER DEFIBRILLATOR IMPLANT N/A 01/09/2011   Procedure: IMPLANTABLE CARDIOVERTER DEFIBRILLATOR IMPLANT;  Surgeon: Evans Lance, MD;  Location: [facility] CATH LAB;  Service: Cardiovascular;  Laterality: N/A;   KNEE ARTHROSCOPY     right   PACEMAKER INSERTION  2012   St Jude    Current Outpatient Medications  Medication Sig Dispense Refill   AMBULATORY NON FORMULARY MEDICATION Inject 300 mg into the skin as directed. Medication Name: Inclisiran sodium vs placebo     aspirin 81 MG tablet Take 81 mg by  mouth daily.       carvedilol (COREG) 12.5 MG tablet TAKE 1 TABLET BY MOUTH TWICE DAILY WITH MEALS 60 tablet 0   fenofibrate 160 MG tablet TAKE 1 TABLET BY MOUTH DAILY 30 tablet 0   fish oil-omega-3 fatty acids 1000 MG capsule Take 4 g by mouth daily.      losartan (COZAAR) 50 MG tablet TAKE 1 TABLET(50 MG) BY MOUTH DAILY 30 tablet 0   nitroGLYCERIN (NITROSTAT) 0.4 MG SL tablet Place 1 tablet (0.4 mg total) under the tongue every 5 (five) minutes as needed for chest pain. Up to 3 doses 25 tablet 3   No current facility-administered medications for this visit.    Allergies:   Isosorbide dinitrate, Rosuvastatin, and Atorvastatin   Social History: Social History   Socioeconomic History   Marital status: Single    Spouse name: Not on file   Number of children: Not on file   Years of education: Not on file   Highest education level: Not on file  Occupational History   Not on file  Tobacco Use   Smoking status: Every Day    Packs/day: 1.00    Years: 34.00    Pack years: 34.00    Types: Cigarettes    Last attempt to quit: 06/13/2009    Years since quitting: 11.3   Smokeless tobacco: Never   Tobacco comments:    Socially  Substance and Sexual Activity  Alcohol use: Yes    Comment: occasional   Drug use: No   Sexual activity: Yes  Other Topics Concern   Not on file  Social History Narrative   Not on file   Social Determinants of Health   Financial Resource Strain: Not on file  Food Insecurity: Not on file  Transportation Needs: Not on file  Physical Activity: Not on file  Stress: Not on file  Social Connections: Not on file  Intimate Partner Violence: Not on file    Family History: Family History  Problem Relation Age of Onset   Aneurysm Mother     Review of Systems: All other systems reviewed and are otherwise negative except as noted above.   Physical Exam: Vitals:   10/11/20 0808  BP: 138/80  Pulse: (!) 56  SpO2: 98%  Weight: 237 lb 9.6 oz (107.8 kg)   Height: 6\' 2"  (1.88 m)     GEN- The patient is well appearing, alert and oriented x 3 today.   HEENT: normocephalic, atraumatic; sclera clear, conjunctiva pink; hearing intact; oropharynx clear; neck supple, no JVP Lymph- no cervical lymphadenopathy Lungs- Clear to ausculation bilaterally, normal work of breathing.  No wheezes, rales, rhonchi Heart- Regular rate and rhythm, no murmurs, rubs or gallops, PMI not laterally displaced GI- soft, non-tender, non-distended, bowel sounds present, no hepatosplenomegaly Extremities- no clubbing or cyanosis. No edema; DP/PT/radial pulses 2+ bilaterally MS- no significant deformity or atrophy Skin- warm and dry, no rash or lesion; ICD pocket well healed Psych- euthymic mood, full affect Neuro- strength and sensation are intact  ICD interrogation- reviewed in detail today,  See PACEART report  EKG:  EKG is ordered today. Personal review of EKG ordered today shows Sinus bradycardia at 56 bpm  Recent Labs: No results found for requested labs within last 8760 hours.   Wt Readings from Last 3 Encounters:  10/11/20 237 lb 9.6 oz (107.8 kg)  10/03/20 234 lb 6.4 oz (106.3 kg)  05/29/20 240 lb (108.9 kg)     Other studies Reviewed: Additional studies/ records that were reviewed today include: Previous EP office notes.   Assessment and Plan:  1.  Chronic systolic dysfunction s/p St. Jude single chamber ICD  euvolemic today Stable on an appropriate medical regimen Normal ICD function See Pace Art report No changes today  2. CAD Denies s/s ischemia  3. HLD Prior auth form signed for [place] study drug. (Study over). Jinny Blossom says it will take about 1 week to hear back.   Current medicines are reviewed at length with the patient today.    Labs/ tests ordered today include:  Orders Placed This Encounter  Procedures   Basic metabolic panel   CBC   EKG 12-Lead   Disposition:   Follow up with Dr. Lovena Le in 12 months  Signed, Shirley Friar, PA-C  10/11/2020 8:12 AM  [facility] [address] [place] [address] [phone] (office) [phone] (fax)

## 2020-10-11 ENCOUNTER — Ambulatory Visit: Payer: Medicare HMO | Admitting: Student

## 2020-10-11 ENCOUNTER — Encounter: Payer: Self-pay | Admitting: Student

## 2020-10-11 ENCOUNTER — Telehealth: Payer: Self-pay | Admitting: Pharmacist

## 2020-10-11 ENCOUNTER — Other Ambulatory Visit: Payer: Self-pay

## 2020-10-11 VITALS — BP 138/80 | HR 56 | Ht 74.0 in | Wt 237.6 lb

## 2020-10-11 DIAGNOSIS — I255 Ischemic cardiomyopathy: Secondary | ICD-10-CM

## 2020-10-11 DIAGNOSIS — Z95810 Presence of automatic (implantable) cardiac defibrillator: Secondary | ICD-10-CM | POA: Diagnosis not present

## 2020-10-11 DIAGNOSIS — I2510 Atherosclerotic heart disease of native coronary artery without angina pectoris: Secondary | ICD-10-CM | POA: Diagnosis not present

## 2020-10-11 DIAGNOSIS — I10 Essential (primary) hypertension: Secondary | ICD-10-CM

## 2020-10-11 LAB — CBC
Hematocrit: 52.2 % — ABNORMAL HIGH (ref 37.5–51.0)
Hemoglobin: 17.5 g/dL (ref 13.0–17.7)
MCH: 31.3 pg (ref 26.6–33.0)
MCHC: 33.5 g/dL (ref 31.5–35.7)
MCV: 93 fL (ref 79–97)
Platelets: 233 10*3/uL (ref 150–450)
RBC: 5.59 x10E6/uL (ref 4.14–5.80)
RDW: 12.1 % (ref 11.6–15.4)
WBC: 7.4 10*3/uL (ref 3.4–10.8)

## 2020-10-11 LAB — CUP PACEART INCLINIC DEVICE CHECK
Battery Remaining Longevity: 24 mo
Brady Statistic RV Percent Paced: 0.14 %
Date Time Interrogation Session: 20220929083135
HighPow Impedance: 67.5 Ohm
Implantable Lead Implant Date: 20121227
Implantable Lead Location: 753860
Implantable Lead Model: 7122
Implantable Pulse Generator Implant Date: 20121227
Lead Channel Impedance Value: 450 Ohm
Lead Channel Pacing Threshold Amplitude: 0.75 V
Lead Channel Pacing Threshold Amplitude: 0.75 V
Lead Channel Pacing Threshold Pulse Width: 0.5 ms
Lead Channel Pacing Threshold Pulse Width: 0.5 ms
Lead Channel Sensing Intrinsic Amplitude: 11.8 mV
Lead Channel Setting Pacing Amplitude: 2.5 V
Lead Channel Setting Pacing Pulse Width: 0.5 ms
Lead Channel Setting Sensing Sensitivity: 0.5 mV
Pulse Gen Serial Number: 1037513

## 2020-10-11 LAB — BASIC METABOLIC PANEL
BUN/Creatinine Ratio: 18 (ref 10–24)
BUN: 17 mg/dL (ref 8–27)
CO2: 24 mmol/L (ref 20–29)
Calcium: 9.5 mg/dL (ref 8.6–10.2)
Chloride: 106 mmol/L (ref 96–106)
Creatinine, Ser: 0.93 mg/dL (ref 0.76–1.27)
Glucose: 106 mg/dL — ABNORMAL HIGH (ref 70–99)
Potassium: 4.9 mmol/L (ref 3.5–5.2)
Sodium: 144 mmol/L (ref 134–144)
eGFR: 92 mL/min/{1.73_m2} (ref >59)

## 2020-10-11 NOTE — Telephone Encounter (Signed)
Pt previously enrolled in [place] 8 trial which has now ended. He is hoping to continue on inclisiran injections. He signed benefit investigation form today while in office for a visit with Jonni Sanger. I have faxed this to [employer] and will call pt once copay information is known.

## 2020-10-11 NOTE — Patient Instructions (Signed)
Medication Instructions:  Your physician recommends that you continue on your current medications as directed. Please refer to the Current Medication list given to you today.  *If you need a refill on your cardiac medications before your next appointment, please call your pharmacy*   Lab Work: TODAY: BMET, CBC  If you have labs (blood work) drawn today and your tests are completely normal, you will receive your results only by: [place] (if you have MyChart) OR A paper copy in the mail If you have any lab test that is abnormal or we need to change your treatment, we will call you to review the results.   Follow-Up: At [facility], you and your health needs are our priority.  As part of our continuing mission to provide you with exceptional heart care, we have created designated Provider Care Teams.  These Care Teams include your primary Cardiologist (physician) and Advanced Practice Providers (APPs -  Physician Assistants and Nurse Practitioners) who all work together to provide you with the care you need, when you need it.  We recommend signing up for the patient portal called "MyChart".  Sign up information is provided on this After Visit Summary.  MyChart is used to connect with patients for Virtual Visits (Telemedicine).  Patients are able to view lab/test results, encounter notes, upcoming appointments, etc.  Non-urgent messages can be sent to your provider as well.   To learn more about what you can do with MyChart, go to [email or web address].    Your next appointment:   1 year(s)  The format for your next appointment:   In Person  Provider:   You may see Cristopher Peru, MD or one of the following Advanced Practice Providers on your designated Care Team:   Tommye Standard, [place] "[facility]" [place], [place]

## 2020-10-17 NOTE — Telephone Encounter (Signed)
Joe [name] is covered at 80% until pt reaches $3900 out of pocket max, then will be covered at 100%. Has met $435 of deductible so far this year.  Called pt as Joe [name] is unaffordable with his current plan ([facility] upcharges each dose for ~$6000 per injection so he would be responsible for > $1,000 per dose). He reports receiving last injection of Leqvio in July so he will have appropriate lipid lowering through December. Discussed changing to a Medicare supplement next year or trying PCSK9i therapy which would be ~$45/month. Would need updated baseline labs in January once Leqvio washes out of his system before insurance would cover PCSK9i. Will call pt in January to reassess.

## 2020-10-23 ENCOUNTER — Ambulatory Visit (INDEPENDENT_AMBULATORY_CARE_PROVIDER_SITE_OTHER): Payer: Medicare HMO

## 2020-10-23 DIAGNOSIS — I255 Ischemic cardiomyopathy: Secondary | ICD-10-CM

## 2020-10-23 LAB — CUP PACEART REMOTE DEVICE CHECK
Battery Remaining Longevity: 24 mo
Battery Remaining Percentage: 20 %
Battery Voltage: 2.77 V
Brady Statistic RV Percent Paced: 1 %
Date Time Interrogation Session: 20221011040547
HighPow Impedance: 65 Ohm
HighPow Impedance: 65 Ohm
Implantable Lead Implant Date: 20121227
Implantable Lead Location: 753860
Implantable Lead Model: 7122
Implantable Pulse Generator Implant Date: 20121227
Lead Channel Impedance Value: 450 Ohm
Lead Channel Pacing Threshold Amplitude: 0.75 V
Lead Channel Pacing Threshold Pulse Width: 0.5 ms
Lead Channel Sensing Intrinsic Amplitude: 11.8 mV
Lead Channel Setting Pacing Amplitude: 2.5 V
Lead Channel Setting Pacing Pulse Width: 0.5 ms
Lead Channel Setting Sensing Sensitivity: 0.5 mV
Pulse Gen Serial Number: 1037513

## 2020-10-31 NOTE — Progress Notes (Signed)
Remote ICD transmission.   

## 2020-11-01 ENCOUNTER — Other Ambulatory Visit: Payer: Self-pay | Admitting: Internal Medicine

## 2020-11-01 NOTE — Research (Signed)
Follow up reviewed.  Patient did follow up regarding lipid options with [facility] as we requested.

## 2020-12-04 ENCOUNTER — Other Ambulatory Visit: Payer: Self-pay | Admitting: Internal Medicine

## 2020-12-04 NOTE — Research (Signed)
Late entry Study drug was completed per protocol on May 29, 2020.

## 2020-12-04 NOTE — Addendum Note (Signed)
Addended by: Sandie Ano D on: 12/04/2020 02:31 PM   Modules accepted: Orders

## 2021-01-22 ENCOUNTER — Ambulatory Visit (INDEPENDENT_AMBULATORY_CARE_PROVIDER_SITE_OTHER): Payer: Medicare HMO

## 2021-01-22 DIAGNOSIS — I255 Ischemic cardiomyopathy: Secondary | ICD-10-CM | POA: Diagnosis not present

## 2021-01-22 LAB — CUP PACEART REMOTE DEVICE CHECK
Battery Remaining Longevity: 19 mo
Battery Remaining Percentage: 16 %
Battery Voltage: 2.74 V
Brady Statistic RV Percent Paced: 1 %
Date Time Interrogation Session: 20230110114210
HighPow Impedance: 61 Ohm
HighPow Impedance: 61 Ohm
Implantable Lead Implant Date: 20121227
Implantable Lead Location: 753860
Implantable Lead Model: 7122
Implantable Pulse Generator Implant Date: 20121227
Lead Channel Impedance Value: 400 Ohm
Lead Channel Pacing Threshold Amplitude: 0.75 V
Lead Channel Pacing Threshold Pulse Width: 0.5 ms
Lead Channel Sensing Intrinsic Amplitude: 11.8 mV
Lead Channel Setting Pacing Amplitude: 2.5 V
Lead Channel Setting Pacing Pulse Width: 0.5 ms
Lead Channel Setting Sensing Sensitivity: 0.5 mV
Pulse Gen Serial Number: 1037513

## 2021-01-28 ENCOUNTER — Telehealth: Payer: Self-pay | Admitting: Pharmacist

## 2021-01-28 DIAGNOSIS — I2510 Atherosclerotic heart disease of native coronary artery without angina pectoris: Secondary | ICD-10-CM

## 2021-01-28 NOTE — Telephone Encounter (Signed)
Spoke with pt regarding lipids (see 10/11/20 phone note for details). He has [employer] Part D insurance. Marion Downer is cost prohibitive but pt is willing to try Repatha. He needs updated labs since he's no longer in the [name] 8 study. He will come for fasting lipid panel this Wednesday and I'll submit prior authorization for Repatha after.

## 2021-01-30 ENCOUNTER — Other Ambulatory Visit: Payer: Medicare HMO | Admitting: *Deleted

## 2021-01-30 ENCOUNTER — Other Ambulatory Visit: Payer: Self-pay

## 2021-01-30 DIAGNOSIS — I2510 Atherosclerotic heart disease of native coronary artery without angina pectoris: Secondary | ICD-10-CM

## 2021-01-30 LAB — LIPID PANEL
Chol/HDL Ratio: 2.4 ratio (ref 0.0–5.0)
Cholesterol, Total: 166 mg/dL (ref 100–199)
HDL: 69 mg/dL (ref >39)
LDL Chol Calc (NIH): 82 mg/dL (ref 0–99)
Triglycerides: 79 mg/dL (ref 0–149)
VLDL Cholesterol Cal: 15 mg/dL (ref 5–40)

## 2021-01-31 MED ORDER — REPATHA SURECLICK 140 MG/ML ~~LOC~~ SOAJ: 1.0000 "pen " | SUBCUTANEOUS | 11 refills | Status: DC

## 2021-01-31 NOTE — Progress Notes (Signed)
Remote ICD transmission.   

## 2021-01-31 NOTE — Telephone Encounter (Signed)
Updated labs show LDL at 82 above goal < 55 given premature ASCVD. Repatha prior authorization has been submitted.

## 2021-01-31 NOTE — Telephone Encounter (Signed)
Repatha prior authorization approved through 07/30/21. Rx sent to pharmacy. Pt would like to give first injection in clinic. He will call once he picks up his prescription to coordinate a time for [place] to help with injection (will not need full office visit). Will need f/u labs scheduled at that time.

## 2021-04-03 DIAGNOSIS — J069 Acute upper respiratory infection, unspecified: Secondary | ICD-10-CM | POA: Diagnosis not present

## 2021-04-03 DIAGNOSIS — R0981 Nasal congestion: Secondary | ICD-10-CM | POA: Diagnosis not present

## 2021-04-03 DIAGNOSIS — R051 Acute cough: Secondary | ICD-10-CM | POA: Diagnosis not present

## 2021-04-22 DIAGNOSIS — H6123 Impacted cerumen, bilateral: Secondary | ICD-10-CM | POA: Diagnosis not present

## 2021-04-22 DIAGNOSIS — J019 Acute sinusitis, unspecified: Secondary | ICD-10-CM | POA: Diagnosis not present

## 2021-04-23 ENCOUNTER — Ambulatory Visit (INDEPENDENT_AMBULATORY_CARE_PROVIDER_SITE_OTHER): Payer: Medicare HMO

## 2021-04-23 DIAGNOSIS — I255 Ischemic cardiomyopathy: Secondary | ICD-10-CM

## 2021-04-23 LAB — CUP PACEART REMOTE DEVICE CHECK
Battery Remaining Longevity: 14 mo
Battery Remaining Percentage: 11 %
Battery Voltage: 2.69 V
Brady Statistic RV Percent Paced: 1 %
Date Time Interrogation Session: 20230411071132
HighPow Impedance: 69 Ohm
HighPow Impedance: 69 Ohm
Implantable Lead Implant Date: 20121227
Implantable Lead Location: 753860
Implantable Lead Model: 7122
Implantable Pulse Generator Implant Date: 20121227
Lead Channel Impedance Value: 480 Ohm
Lead Channel Pacing Threshold Amplitude: 0.75 V
Lead Channel Pacing Threshold Pulse Width: 0.5 ms
Lead Channel Sensing Intrinsic Amplitude: 12 mV
Lead Channel Setting Pacing Amplitude: 2.5 V
Lead Channel Setting Pacing Pulse Width: 0.5 ms
Lead Channel Setting Sensing Sensitivity: 0.5 mV
Pulse Gen Serial Number: 1037513

## 2021-05-10 NOTE — Progress Notes (Signed)
Remote ICD transmission.   

## 2021-06-26 DIAGNOSIS — Z131 Encounter for screening for diabetes mellitus: Secondary | ICD-10-CM | POA: Diagnosis not present

## 2021-06-26 DIAGNOSIS — Z122 Encounter for screening for malignant neoplasm of respiratory organs: Secondary | ICD-10-CM | POA: Diagnosis not present

## 2021-06-26 DIAGNOSIS — Z125 Encounter for screening for malignant neoplasm of prostate: Secondary | ICD-10-CM | POA: Diagnosis not present

## 2021-06-26 DIAGNOSIS — E781 Pure hyperglyceridemia: Secondary | ICD-10-CM | POA: Diagnosis not present

## 2021-06-26 DIAGNOSIS — Z0000 Encounter for general adult medical examination without abnormal findings: Secondary | ICD-10-CM | POA: Diagnosis not present

## 2021-06-26 DIAGNOSIS — R7301 Impaired fasting glucose: Secondary | ICD-10-CM | POA: Diagnosis not present

## 2021-06-26 DIAGNOSIS — F17200 Nicotine dependence, unspecified, uncomplicated: Secondary | ICD-10-CM | POA: Diagnosis not present

## 2021-06-26 DIAGNOSIS — I10 Essential (primary) hypertension: Secondary | ICD-10-CM | POA: Diagnosis not present

## 2021-06-26 DIAGNOSIS — H6121 Impacted cerumen, right ear: Secondary | ICD-10-CM | POA: Diagnosis not present

## 2021-06-28 ENCOUNTER — Other Ambulatory Visit (HOSPITAL_BASED_OUTPATIENT_CLINIC_OR_DEPARTMENT_OTHER): Payer: Self-pay | Admitting: Family Medicine

## 2021-06-28 DIAGNOSIS — F17210 Nicotine dependence, cigarettes, uncomplicated: Secondary | ICD-10-CM

## 2021-07-03 ENCOUNTER — Telehealth (HOSPITAL_BASED_OUTPATIENT_CLINIC_OR_DEPARTMENT_OTHER): Payer: Self-pay

## 2021-07-19 ENCOUNTER — Other Ambulatory Visit: Payer: Self-pay

## 2021-07-19 MED ORDER — CARVEDILOL 12.5 MG PO TABS: 12.5000 mg | ORAL_TABLET | Freq: Two times a day (BID) | ORAL | 0 refills | Status: DC

## 2021-07-23 ENCOUNTER — Ambulatory Visit (INDEPENDENT_AMBULATORY_CARE_PROVIDER_SITE_OTHER): Payer: Medicare HMO

## 2021-07-23 DIAGNOSIS — I255 Ischemic cardiomyopathy: Secondary | ICD-10-CM | POA: Diagnosis not present

## 2021-07-25 LAB — CUP PACEART REMOTE DEVICE CHECK
Battery Remaining Longevity: 12 mo
Battery Remaining Percentage: 10 %
Battery Voltage: 2.69 V
Brady Statistic RV Percent Paced: 1 %
Date Time Interrogation Session: 20230713034128
HighPow Impedance: 61 Ohm
HighPow Impedance: 61 Ohm
Implantable Lead Implant Date: 20121227
Implantable Lead Location: 753860
Implantable Lead Model: 7122
Implantable Pulse Generator Implant Date: 20121227
Lead Channel Impedance Value: 390 Ohm
Lead Channel Pacing Threshold Amplitude: 0.75 V
Lead Channel Pacing Threshold Pulse Width: 0.5 ms
Lead Channel Sensing Intrinsic Amplitude: 11.8 mV
Lead Channel Setting Pacing Amplitude: 2.5 V
Lead Channel Setting Pacing Pulse Width: 0.5 ms
Lead Channel Setting Sensing Sensitivity: 0.5 mV
Pulse Gen Serial Number: 1037513

## 2021-08-14 NOTE — Progress Notes (Signed)
Remote ICD transmission.   

## 2021-09-01 ENCOUNTER — Other Ambulatory Visit: Payer: Self-pay | Admitting: Internal Medicine

## 2021-09-02 ENCOUNTER — Other Ambulatory Visit: Payer: Self-pay

## 2021-09-02 ENCOUNTER — Other Ambulatory Visit: Payer: Self-pay | Admitting: Internal Medicine

## 2021-09-02 MED ORDER — LOSARTAN POTASSIUM 50 MG PO TABS: ORAL_TABLET | ORAL | 1 refills | Status: DC

## 2021-10-01 ENCOUNTER — Other Ambulatory Visit: Payer: Self-pay | Admitting: Internal Medicine

## 2021-10-22 ENCOUNTER — Ambulatory Visit (INDEPENDENT_AMBULATORY_CARE_PROVIDER_SITE_OTHER): Payer: Medicare HMO

## 2021-10-22 DIAGNOSIS — I255 Ischemic cardiomyopathy: Secondary | ICD-10-CM

## 2021-10-22 LAB — CUP PACEART REMOTE DEVICE CHECK
Battery Remaining Longevity: 9 mo
Battery Remaining Percentage: 7 %
Battery Voltage: 2.65 V
Brady Statistic RV Percent Paced: 1 %
Date Time Interrogation Session: 20231010041121
HighPow Impedance: 65 Ohm
HighPow Impedance: 65 Ohm
Implantable Lead Implant Date: 20121227
Implantable Lead Location: 753860
Implantable Lead Model: 7122
Implantable Pulse Generator Implant Date: 20121227
Lead Channel Impedance Value: 460 Ohm
Lead Channel Pacing Threshold Amplitude: 0.75 V
Lead Channel Pacing Threshold Pulse Width: 0.5 ms
Lead Channel Sensing Intrinsic Amplitude: 12 mV
Lead Channel Setting Pacing Amplitude: 2.5 V
Lead Channel Setting Pacing Pulse Width: 0.5 ms
Lead Channel Setting Sensing Sensitivity: 0.5 mV
Pulse Gen Serial Number: 1037513

## 2021-10-31 ENCOUNTER — Other Ambulatory Visit: Payer: Self-pay | Admitting: Internal Medicine

## 2021-11-05 NOTE — Progress Notes (Signed)
Remote ICD transmission.   

## 2021-11-11 ENCOUNTER — Other Ambulatory Visit: Payer: Self-pay | Admitting: Internal Medicine

## 2021-11-11 ENCOUNTER — Telehealth: Payer: Self-pay | Admitting: Internal Medicine

## 2021-11-11 MED ORDER — FENOFIBRATE 160 MG PO TABS: 160.0000 mg | ORAL_TABLET | Freq: Every day | ORAL | 0 refills | Status: DC

## 2021-11-11 NOTE — Telephone Encounter (Signed)
*  STAT* If patient is at the pharmacy, call can be transferred to refill team.   1. Which medications need to be refilled? (please list name of each medication and dose if known)  carvedilol (COREG) 12.5 MG tablet losartan (COZAAR) 50 MG tablet fenofibrate 160 MG tablet  2. Which pharmacy/location (including street and city if local pharmacy) is medication to be sent to? [place], [address]  3. Do they need a 30 day or 90 day supply? Needs enough medication to last him until 12/01 appt.

## 2021-11-11 NOTE — Telephone Encounter (Signed)
Pt's medications were already sent to pt's pharmacy as requested. Confirmation received.  

## 2021-11-12 ENCOUNTER — Ambulatory Visit: Payer: Medicare HMO | Attending: Physician Assistant | Admitting: Physician Assistant

## 2021-11-12 ENCOUNTER — Encounter: Payer: Self-pay | Admitting: Physician Assistant

## 2021-11-12 VITALS — BP 130/80 | HR 59 | Ht 74.0 in | Wt 241.2 lb

## 2021-11-12 DIAGNOSIS — I10 Essential (primary) hypertension: Secondary | ICD-10-CM

## 2021-11-12 DIAGNOSIS — I2510 Atherosclerotic heart disease of native coronary artery without angina pectoris: Secondary | ICD-10-CM | POA: Diagnosis not present

## 2021-11-12 DIAGNOSIS — I5022 Chronic systolic (congestive) heart failure: Secondary | ICD-10-CM | POA: Diagnosis not present

## 2021-11-12 DIAGNOSIS — Z95810 Presence of automatic (implantable) cardiac defibrillator: Secondary | ICD-10-CM

## 2021-11-12 DIAGNOSIS — I255 Ischemic cardiomyopathy: Secondary | ICD-10-CM | POA: Diagnosis not present

## 2021-11-12 LAB — CUP PACEART INCLINIC DEVICE CHECK
Battery Remaining Longevity: 7 mo
Brady Statistic RV Percent Paced: 0.04 %
Date Time Interrogation Session: 20231031131630
HighPow Impedance: 70.875
Implantable Lead Connection Status: 753985
Implantable Lead Implant Date: 20121227
Implantable Lead Location: 753860
Implantable Lead Model: 7122
Implantable Pulse Generator Implant Date: 20121227
Lead Channel Impedance Value: 450 Ohm
Lead Channel Pacing Threshold Amplitude: 0.75 V
Lead Channel Pacing Threshold Amplitude: 0.75 V
Lead Channel Pacing Threshold Pulse Width: 0.5 ms
Lead Channel Pacing Threshold Pulse Width: 0.5 ms
Lead Channel Sensing Intrinsic Amplitude: 11.8 mV
Lead Channel Setting Pacing Amplitude: 2.5 V
Lead Channel Setting Pacing Pulse Width: 0.5 ms
Lead Channel Setting Sensing Sensitivity: 0.5 mV
Pulse Gen Serial Number: 1037513
Zone Setting Status: 755011

## 2021-11-12 MED ORDER — NITROGLYCERIN 0.4 MG SL SUBL: 0.4000 mg | SUBLINGUAL_TABLET | SUBLINGUAL | 2 refills | Status: AC | PRN

## 2021-11-12 MED ORDER — FENOFIBRATE 160 MG PO TABS: 160.0000 mg | ORAL_TABLET | Freq: Every day | ORAL | 3 refills | Status: DC

## 2021-11-12 MED ORDER — LOSARTAN POTASSIUM 50 MG PO TABS: ORAL_TABLET | ORAL | 3 refills | Status: DC

## 2021-11-12 MED ORDER — CARVEDILOL 12.5 MG PO TABS: 12.5000 mg | ORAL_TABLET | Freq: Two times a day (BID) | ORAL | 3 refills | Status: DC

## 2021-11-12 NOTE — Patient Instructions (Addendum)
Medication Instructions:   Your physician recommends that you continue on your current medications as directed. Please refer to the Current Medication list given to you today.  *If you need a refill on your cardiac medications before your next appointment, please call your pharmacy*   Lab Work: [place]    If you have labs (blood work) drawn today and your tests are completely normal, you will receive your results only by: [place] (if you have MyChart) OR A paper copy in the mail If you have any lab test that is abnormal or we need to change your treatment, we will call you to review the results.   Testing/Procedures: NONE ORDERED  TODAY   Follow-Up: At [facility], you and your health needs are our priority.  As part of our continuing mission to provide you with exceptional heart care, we have created designated Provider Care Teams.  These Care Teams include your primary Cardiologist (physician) and Advanced Practice Providers (APPs -  Physician Assistants and Nurse Practitioners) who all work together to provide you with the care you need, when you need it.  We recommend signing up for the patient portal called "MyChart".  Sign up information is provided on this After Visit Summary.  MyChart is used to connect with patients for Virtual Visits (Telemedicine).  Patients are able to view lab/test results, encounter notes, upcoming appointments, etc.  Non-urgent messages can be sent to your provider as well.   To learn more about what you can do with MyChart, go to [email or web address].     Your next appointment:  DR TURNER NEXT AVAILABLE  ( OVERDUE)  6 month(s)  The format for your next appointment:    In Person  Provider:    Cristopher Peru, MD   Other Instructions   Important Information About [place]

## 2021-11-12 NOTE — Progress Notes (Signed)
Cardiology Office Note Date:  11/12/2021  Patient ID:  Joe [name], Joe [name] 12/03/1955, MRN [id number] PCP:  Briscoe Deutscher, MD  Cardiologist:  Dr. Radford Pax Electrophysiologist: Dr. Lovena Le    Chief Complaint:  annual visit  History of Present Illness: Joe [name] is a 66 y.o. male with history of ICM w/ICD, chronic CHF (systolic), HTN, CAD w/PCI to LAD 6270 (complicated by VF arrest).    He saw Dr. Lovena Le Sept 2021.  He saw Jonni Sanger Sept 2022, had recently finished the [place] study and is working to try and get the drug through pharmacy.  Denied symptoms.  No changes were made.  Pharmacy team was on board with his lipid drug.  TODAY He is doing very well Denies any symptoms No CP, palpitations, SOB No shocks No syncope.  He had labs done in June via PMD or wellness visit  Device information: SJM single chamber ICD implanted 01/09/11, Dr. Lovena Le Device is on advisory (patient had received letter), We have previously demonstrated vibratory alert, and d/w patient that at this time industry recommendation is that the device not be changed out.  Discussed importance of remotes   Past Medical History:  Diagnosis Date   Chronic systolic CHF (congestive heart failure), NYHA class 1 (HCC)    EF 37% by nuclear stress test   Coronary artery disease    s/p PCI mild LAD (BMS) in setting of AWMI complicated by Vfib arrest with 40-50% residual disesae in left circ   Hyperlipidemia    Ischemic cardiomyopathy    A. 01/09/2011 - s/p St. Jude Fortify ST VR [place] AICD   OSA (obstructive sleep apnea)    severe with AHI 26/hr intolerant to CPAP   VF (ventricular fibrillation) ([place])    arrest in setting of AMI 6/11    Past Surgical History:  Procedure Laterality Date   BACK SURGERY     BACK SURGERY     IMPLANTABLE CARDIOVERTER DEFIBRILLATOR IMPLANT N/A 01/09/2011   Procedure: IMPLANTABLE CARDIOVERTER DEFIBRILLATOR IMPLANT;  Surgeon: Evans Lance, MD;  Location: [facility] CATH LAB;   Service: Cardiovascular;  Laterality: N/A;   KNEE ARTHROSCOPY     right   PACEMAKER INSERTION  2012   St Jude    Current Outpatient Medications  Medication Sig Dispense Refill   aspirin 81 MG tablet Take 81 mg by mouth daily.       Evolocumab (REPATHA SURECLICK) 350 MG/ML SOAJ Inject 1 pen into the skin every 14 (fourteen) days. 2 mL 11   fish oil-omega-3 fatty acids 1000 MG capsule Take 4 g by mouth daily.      carvedilol (COREG) 12.5 MG tablet Take 1 tablet (12.5 mg total) by mouth 2 (two) times daily with a meal. 180 tablet 3   fenofibrate 160 MG tablet Take 1 tablet (160 mg total) by mouth daily. 90 tablet 3   losartan (COZAAR) 50 MG tablet TAKE 1 TABLET(50 MG) BY MOUTH DAILY 90 tablet 3   nitroGLYCERIN (NITROSTAT) 0.4 MG SL tablet Place 1 tablet (0.4 mg total) under the tongue every 5 (five) minutes as needed for chest pain. Up to 3 doses 25 tablet 2   No current facility-administered medications for this visit.    Allergies:   Isosorbide dinitrate, Rosuvastatin, and Atorvastatin   Social History:  The patient  reports that he has been smoking cigarettes. He has a 34.00 pack-year smoking history. He has never used smokeless tobacco. He reports current alcohol use. He reports that he does not use  drugs.   Family History:  The patient's family history includes Aneurysm in his mother.  ROS:  Please see the history of present illness.  All other systems are reviewed and otherwise negative.   PHYSICAL EXAM:  VS:  BP 130/80   Pulse (!) 59   Ht '6\' 2"'$  (1.88 m)   Wt 241 lb 3.2 oz (109.4 kg)   SpO2 96%   BMI 30.97 kg/m  BMI: Body mass index is 30.97 kg/m. Well nourished, well developed, in no acute distress  HEENT: normocephalic, atraumatic  Neck: no JVD, carotid bruits or masses Cardiac:  RRR; no significant murmurs, no rubs, or gallops Lungs: CTA b/l, no wheezing, rhonchi or rales  Abd: soft, nontender MS: no deformity or atrophy Ext: no edema  Skin: warm and dry, no  rash Neuro:  No gross deficits appreciated Psych: euthymic mood, full affect  ICD site is stable, no tethering or discomfort  EKG:  Done today and reviewed by myself SB 59bpm LAD, poor R progression  ICD interrogation done today and reviewed by myself:  Battery estimate to ERI is 7.44moLead measurements are good No VT   05/08/15: TTE Study Conclusions  - Left ventricle: The cavity size was mildly dilated. Wall   thickness was normal. Systolic function was moderately to   severely reduced. The estimated ejection fraction was in the   range of 30% to 35%. Anteroseptal akinesis, mid to apical   inferoseptal akinesis, apical anterior akinesis, apical inferior   akinesis, akinesis of the true apex. Doppler parameters are   consistent with abnormal left ventricular relaxation (grade 1   diastolic dysfunction). - Aortic valve: There was no stenosis. - Aorta: Mildly dilated aortic root. Aortic root dimension: 38 mm   (ED). - Mitral valve: There was trivial regurgitation. - Left atrium: The atrium was mildly dilated. - Right ventricle: The cavity size was normal. Pacer wire or   catheter noted in right ventricle. Systolic function was normal. - Tricuspid valve: Peak RV-RA gradient (S): 23 mm Hg. - Pulmonary arteries: PA peak pressure: 31 mm Hg (S). - Systemic veins: IVC measured 2.4 cm with normal respirophasic   variation, suggesting RA pressure 8 mmHg. Impressions: - Mildly dilated LV. EF 30-35%. Wall motion abnormalities as noted   above in the distribution of LAD infarction. Normal RV size and   systolic function. No significant valvular abnormalities.     Recent Labs: No results found for requested labs within last 365 days.  01/30/2021: Chol/HDL Ratio 2.4; Cholesterol, Total 166; HDL 69; LDL Chol Calc (NIH) 82; Triglycerides 79   CrCl cannot be calculated (Patient's most recent lab result is older than the maximum 21 days allowed.).   Wt Readings from Last 3 Encounters:   11/12/21 241 lb 3.2 oz (109.4 kg)  10/11/20 237 lb 9.6 oz (107.8 kg)  10/03/20 234 lb 6.4 oz (106.3 kg)     Other studies reviewed: Additional studies/records reviewed today include: summarized above  ASSESSMENT AND PLAN:  1. ICD    Nearing ERI    Intact function    No programming changes made  2. ICM 3. Chronic CHF (systolic)     Well compensated     On BB/ARB  4. CAD     No anginal sounding complaints     Has not seen Dr. TRadford Paxin a long time  5. HTN     Looks OK, no changes   Disposition: Device clinic has adjusted his remotes to monthly checks, will have  him back in 53mo will be bear ERI by then likely.  Will have him scheduled to get back in track with Dr. TRadford Pax    Current medicines are reviewed at length with the patient today.  The patient did not have any concerns regarding medicines.  SHaywood Lasso PA-C 11/12/2021 12:44 PM     [place] [phone](office)  [phone](fax)

## 2021-12-13 ENCOUNTER — Encounter: Payer: Medicare HMO | Admitting: Physician Assistant

## 2021-12-13 ENCOUNTER — Ambulatory Visit (INDEPENDENT_AMBULATORY_CARE_PROVIDER_SITE_OTHER): Payer: Medicare HMO

## 2021-12-13 DIAGNOSIS — I255 Ischemic cardiomyopathy: Secondary | ICD-10-CM

## 2021-12-16 LAB — CUP PACEART REMOTE DEVICE CHECK
Date Time Interrogation Session: 20231201103547
Implantable Lead Connection Status: 753985
Implantable Lead Implant Date: 20121227
Implantable Lead Location: 753860
Implantable Lead Model: 7122
Implantable Pulse Generator Implant Date: 20121227
Pulse Gen Serial Number: 1037513

## 2021-12-24 DIAGNOSIS — J069 Acute upper respiratory infection, unspecified: Secondary | ICD-10-CM | POA: Diagnosis not present

## 2021-12-31 NOTE — Addendum Note (Signed)
Addended by: Cheri Kearns A on: 12/31/2021 01:02 PM   Modules accepted: Level of Service

## 2021-12-31 NOTE — Progress Notes (Signed)
Remote ICD transmission.   

## 2022-01-14 ENCOUNTER — Ambulatory Visit (INDEPENDENT_AMBULATORY_CARE_PROVIDER_SITE_OTHER): Payer: Medicare HMO

## 2022-01-14 DIAGNOSIS — I255 Ischemic cardiomyopathy: Secondary | ICD-10-CM

## 2022-01-17 LAB — CUP PACEART REMOTE DEVICE CHECK
Battery Remaining Longevity: 8 mo
Battery Remaining Percentage: 6 %
Battery Voltage: 2.63 V
Brady Statistic RV Percent Paced: 1 %
Date Time Interrogation Session: 20240105051603
HighPow Impedance: 70 Ohm
HighPow Impedance: 70 Ohm
Implantable Lead Connection Status: 753985
Implantable Lead Implant Date: 20121227
Implantable Lead Location: 753860
Implantable Lead Model: 7122
Implantable Pulse Generator Implant Date: 20121227
Lead Channel Impedance Value: 450 Ohm
Lead Channel Pacing Threshold Amplitude: 0.75 V
Lead Channel Pacing Threshold Pulse Width: 0.5 ms
Lead Channel Sensing Intrinsic Amplitude: 11.8 mV
Lead Channel Setting Pacing Amplitude: 2.5 V
Lead Channel Setting Pacing Pulse Width: 0.5 ms
Lead Channel Setting Sensing Sensitivity: 0.5 mV
Pulse Gen Serial Number: 1037513
Zone Setting Status: 755011

## 2022-01-22 ENCOUNTER — Other Ambulatory Visit (HOSPITAL_COMMUNITY): Payer: Self-pay

## 2022-01-26 ENCOUNTER — Emergency Department (HOSPITAL_COMMUNITY): Payer: Medicare HMO

## 2022-01-26 ENCOUNTER — Inpatient Hospital Stay (HOSPITAL_COMMUNITY): Payer: Medicare HMO | Admitting: Certified Registered Nurse Anesthetist

## 2022-01-26 ENCOUNTER — Encounter (HOSPITAL_COMMUNITY): Payer: Self-pay

## 2022-01-26 ENCOUNTER — Inpatient Hospital Stay (HOSPITAL_COMMUNITY)
Admission: EM | Admit: 2022-01-26 | Discharge: 2022-02-07 | DRG: 021 | Disposition: A | Discharge status: Skilled Nursing Facility | Payer: Medicare HMO | Attending: Neurosurgery | Admitting: Neurosurgery

## 2022-01-26 ENCOUNTER — Encounter (HOSPITAL_COMMUNITY)
Admission: EM | Disposition: A | Discharge status: Skilled Nursing Facility | Payer: Self-pay | Source: Home / Self Care | Attending: Neurosurgery

## 2022-01-26 ENCOUNTER — Other Ambulatory Visit: Payer: Self-pay

## 2022-01-26 DIAGNOSIS — Z9861 Coronary angioplasty status: Secondary | ICD-10-CM | POA: Diagnosis not present

## 2022-01-26 DIAGNOSIS — M6281 Muscle weakness (generalized): Secondary | ICD-10-CM | POA: Diagnosis not present

## 2022-01-26 DIAGNOSIS — I252 Old myocardial infarction: Secondary | ICD-10-CM

## 2022-01-26 DIAGNOSIS — S066XAA Traumatic subarachnoid hemorrhage with loss of consciousness status unknown, initial encounter: Secondary | ICD-10-CM | POA: Diagnosis not present

## 2022-01-26 DIAGNOSIS — I608 Other nontraumatic subarachnoid hemorrhage: Secondary | ICD-10-CM | POA: Diagnosis not present

## 2022-01-26 DIAGNOSIS — I69011 Memory deficit following nontraumatic subarachnoid hemorrhage: Secondary | ICD-10-CM | POA: Diagnosis not present

## 2022-01-26 DIAGNOSIS — I509 Heart failure, unspecified: Secondary | ICD-10-CM | POA: Diagnosis not present

## 2022-01-26 DIAGNOSIS — Z95810 Presence of automatic (implantable) cardiac defibrillator: Secondary | ICD-10-CM | POA: Diagnosis not present

## 2022-01-26 DIAGNOSIS — F05 Delirium due to known physiological condition: Secondary | ICD-10-CM | POA: Diagnosis not present

## 2022-01-26 DIAGNOSIS — I671 Cerebral aneurysm, nonruptured: Secondary | ICD-10-CM | POA: Diagnosis not present

## 2022-01-26 DIAGNOSIS — I604 Nontraumatic subarachnoid hemorrhage from basilar artery: Secondary | ICD-10-CM | POA: Diagnosis not present

## 2022-01-26 DIAGNOSIS — I725 Aneurysm of other precerebral arteries: Secondary | ICD-10-CM | POA: Diagnosis not present

## 2022-01-26 DIAGNOSIS — I5022 Chronic systolic (congestive) heart failure: Secondary | ICD-10-CM | POA: Diagnosis not present

## 2022-01-26 DIAGNOSIS — I69091 Dysphagia following nontraumatic subarachnoid hemorrhage: Secondary | ICD-10-CM | POA: Diagnosis not present

## 2022-01-26 DIAGNOSIS — I110 Hypertensive heart disease with heart failure: Secondary | ICD-10-CM

## 2022-01-26 DIAGNOSIS — G4733 Obstructive sleep apnea (adult) (pediatric): Secondary | ICD-10-CM | POA: Diagnosis present

## 2022-01-26 DIAGNOSIS — F17200 Nicotine dependence, unspecified, uncomplicated: Secondary | ICD-10-CM | POA: Diagnosis not present

## 2022-01-26 DIAGNOSIS — F17210 Nicotine dependence, cigarettes, uncomplicated: Secondary | ICD-10-CM | POA: Diagnosis not present

## 2022-01-26 DIAGNOSIS — G43909 Migraine, unspecified, not intractable, without status migrainosus: Secondary | ICD-10-CM | POA: Diagnosis not present

## 2022-01-26 DIAGNOSIS — I609 Nontraumatic subarachnoid hemorrhage, unspecified: Secondary | ICD-10-CM

## 2022-01-26 DIAGNOSIS — R339 Retention of urine, unspecified: Secondary | ICD-10-CM | POA: Diagnosis not present

## 2022-01-26 DIAGNOSIS — I2510 Atherosclerotic heart disease of native coronary artery without angina pectoris: Secondary | ICD-10-CM | POA: Diagnosis not present

## 2022-01-26 DIAGNOSIS — I6501 Occlusion and stenosis of right vertebral artery: Secondary | ICD-10-CM | POA: Diagnosis not present

## 2022-01-26 DIAGNOSIS — I4891 Unspecified atrial fibrillation: Secondary | ICD-10-CM | POA: Diagnosis not present

## 2022-01-26 DIAGNOSIS — Z8674 Personal history of sudden cardiac arrest: Secondary | ICD-10-CM

## 2022-01-26 DIAGNOSIS — Z7982 Long term (current) use of aspirin: Secondary | ICD-10-CM

## 2022-01-26 DIAGNOSIS — Z79899 Other long term (current) drug therapy: Secondary | ICD-10-CM | POA: Diagnosis not present

## 2022-01-26 DIAGNOSIS — Z8679 Personal history of other diseases of the circulatory system: Secondary | ICD-10-CM | POA: Diagnosis not present

## 2022-01-26 DIAGNOSIS — R42 Dizziness and giddiness: Secondary | ICD-10-CM | POA: Diagnosis not present

## 2022-01-26 DIAGNOSIS — E871 Hypo-osmolality and hyponatremia: Secondary | ICD-10-CM | POA: Diagnosis not present

## 2022-01-26 DIAGNOSIS — I255 Ischemic cardiomyopathy: Secondary | ICD-10-CM | POA: Diagnosis present

## 2022-01-26 DIAGNOSIS — R519 Headache, unspecified: Secondary | ICD-10-CM | POA: Diagnosis not present

## 2022-01-26 DIAGNOSIS — R279 Unspecified lack of coordination: Secondary | ICD-10-CM | POA: Diagnosis not present

## 2022-01-26 DIAGNOSIS — I10 Essential (primary) hypertension: Secondary | ICD-10-CM | POA: Diagnosis not present

## 2022-01-26 DIAGNOSIS — E785 Hyperlipidemia, unspecified: Secondary | ICD-10-CM | POA: Diagnosis present

## 2022-01-26 DIAGNOSIS — Z888 Allergy status to other drugs, medicaments and biological substances status: Secondary | ICD-10-CM | POA: Diagnosis not present

## 2022-01-26 DIAGNOSIS — I69022 Dysarthria following nontraumatic subarachnoid hemorrhage: Secondary | ICD-10-CM | POA: Diagnosis not present

## 2022-01-26 DIAGNOSIS — I6523 Occlusion and stenosis of bilateral carotid arteries: Secondary | ICD-10-CM | POA: Diagnosis not present

## 2022-01-26 HISTORY — PX: IR ANGIOGRAM FOLLOW UP STUDY: IMG697

## 2022-01-26 HISTORY — PX: IR TRANSCATH/EMBOLIZ: IMG695

## 2022-01-26 HISTORY — PX: IR ANGIO VERTEBRAL SEL VERTEBRAL UNI R MOD SED: IMG5368

## 2022-01-26 HISTORY — PX: IR ANGIO INTRA EXTRACRAN SEL INTERNAL CAROTID BILAT MOD SED: IMG5363

## 2022-01-26 HISTORY — PX: RADIOLOGY WITH ANESTHESIA: SHX6223

## 2022-01-26 HISTORY — PX: IR NEURO EACH ADD'L AFTER BASIC UNI LEFT (MS): IMG5373

## 2022-01-26 LAB — I-STAT CHEM 8, ED
BUN: 19 mg/dL (ref 8–23)
Calcium, Ion: 1.26 mmol/L (ref 1.15–1.40)
Chloride: 107 mmol/L (ref 98–111)
Creatinine, Ser: 0.8 mg/dL (ref 0.61–1.24)
Glucose, Bld: 145 mg/dL — ABNORMAL HIGH (ref 70–99)
HCT: 50 % (ref 39.0–52.0)
Hemoglobin: 17 g/dL (ref 13.0–17.0)
Potassium: 3.8 mmol/L (ref 3.5–5.1)
Sodium: 143 mmol/L (ref 135–145)
TCO2: 24 mmol/L (ref 22–32)

## 2022-01-26 LAB — DIFFERENTIAL
Abs Immature Granulocytes: 0.08 10*3/uL — ABNORMAL HIGH (ref 0.00–0.07)
Basophils Absolute: 0.2 10*3/uL — ABNORMAL HIGH (ref 0.0–0.1)
Basophils Relative: 1 %
Eosinophils Absolute: 0.4 10*3/uL (ref 0.0–0.5)
Eosinophils Relative: 3 %
Immature Granulocytes: 1 %
Lymphocytes Relative: 29 %
Lymphs Abs: 4.2 10*3/uL — ABNORMAL HIGH (ref 0.7–4.0)
Monocytes Absolute: 0.7 10*3/uL (ref 0.1–1.0)
Monocytes Relative: 5 %
Neutro Abs: 8.7 10*3/uL — ABNORMAL HIGH (ref 1.7–7.7)
Neutrophils Relative %: 61 %

## 2022-01-26 LAB — COMPREHENSIVE METABOLIC PANEL
ALT: 21 U/L (ref 0–44)
AST: 27 U/L (ref 15–41)
Albumin: 4.3 g/dL (ref 3.5–5.0)
Alkaline Phosphatase: 46 U/L (ref 38–126)
Anion gap: 9 (ref 5–15)
BUN: 20 mg/dL (ref 8–23)
CO2: 24 mmol/L (ref 22–32)
Calcium: 9.3 mg/dL (ref 8.9–10.3)
Chloride: 108 mmol/L (ref 98–111)
Creatinine, Ser: 0.86 mg/dL (ref 0.61–1.24)
GFR, Estimated: >60 mL/min (ref >60)
Glucose, Bld: 151 mg/dL — ABNORMAL HIGH (ref 70–99)
Potassium: 3.7 mmol/L (ref 3.5–5.1)
Sodium: 141 mmol/L (ref 135–145)
Total Bilirubin: 0.6 mg/dL (ref 0.3–1.2)
Total Protein: 7.1 g/dL (ref 6.5–8.1)

## 2022-01-26 LAB — CBC
HCT: 50.2 % (ref 39.0–52.0)
Hemoglobin: 17.1 g/dL — ABNORMAL HIGH (ref 13.0–17.0)
MCH: 31.6 pg (ref 26.0–34.0)
MCHC: 34.1 g/dL (ref 30.0–36.0)
MCV: 92.8 fL (ref 80.0–100.0)
Platelets: 260 10*3/uL (ref 150–400)
RBC: 5.41 MIL/uL (ref 4.22–5.81)
RDW: 12.5 % (ref 11.5–15.5)
WBC: 14.1 10*3/uL — ABNORMAL HIGH (ref 4.0–10.5)
nRBC: 0 % (ref 0.0–0.2)

## 2022-01-26 LAB — PROTIME-INR
INR: 1 (ref 0.8–1.2)
Prothrombin Time: 13.1 seconds (ref 11.4–15.2)

## 2022-01-26 LAB — APTT: aPTT: 24 seconds (ref 24–36)

## 2022-01-26 LAB — ETHANOL: Alcohol, Ethyl (B): <10 mg/dL (ref <10)

## 2022-01-26 LAB — MRSA NEXT GEN BY PCR, NASAL: MRSA by PCR Next Gen: NOT DETECTED

## 2022-01-26 SURGERY — IR WITH ANESTHESIA
Anesthesia: General

## 2022-01-26 MED ORDER — LABETALOL HCL 5 MG/ML IV SOLN: 20.0000 mg | Freq: Once | INTRAVENOUS | Status: DC

## 2022-01-26 MED ORDER — FENTANYL CITRATE (PF) 250 MCG/5ML IJ SOLN
INTRAMUSCULAR | Status: DC | PRN
  Administered 2022-01-26: 100 ug via INTRAVENOUS

## 2022-01-26 MED ORDER — ACETAMINOPHEN 160 MG/5ML PO SOLN: 650.0000 mg | ORAL | Status: DC | PRN

## 2022-01-26 MED ORDER — ONDANSETRON HCL 4 MG/2ML IJ SOLN
INTRAMUSCULAR | Status: DC | PRN
  Administered 2022-01-26: 4 mg via INTRAVENOUS

## 2022-01-26 MED ORDER — SUCCINYLCHOLINE CHLORIDE 200 MG/10ML IV SOSY
PREFILLED_SYRINGE | INTRAVENOUS | Status: DC | PRN
  Administered 2022-01-26: 140 mg via INTRAVENOUS

## 2022-01-26 MED ORDER — LACTATED RINGERS IV SOLN: INTRAVENOUS | Status: DC | PRN

## 2022-01-26 MED ORDER — CLEVIDIPINE BUTYRATE 0.5 MG/ML IV EMUL
INTRAVENOUS | Status: AC
  Filled 2022-01-26: qty 50

## 2022-01-26 MED ORDER — DOCUSATE SODIUM 100 MG PO CAPS
100.0000 mg | ORAL_CAPSULE | Freq: Two times a day (BID) | ORAL | Status: DC
  Administered 2022-01-27 – 2022-02-07 (×19): 100 mg via ORAL
  Filled 2022-01-26 (×20): qty 1

## 2022-01-26 MED ORDER — EPHEDRINE SULFATE (PRESSORS) 50 MG/ML IJ SOLN
INTRAMUSCULAR | Status: DC | PRN
  Administered 2022-01-26: 5 mg via INTRAVENOUS

## 2022-01-26 MED ORDER — ORAL CARE MOUTH RINSE: 15.0000 mL | Freq: Once | OROMUCOSAL | Status: AC

## 2022-01-26 MED ORDER — ACETAMINOPHEN-CODEINE 300-30 MG PO TABS
1.0000 | ORAL_TABLET | ORAL | Status: DC | PRN
  Administered 2022-01-26 – 2022-01-29 (×7): 2 via ORAL
  Administered 2022-01-29 (×2): 1 via ORAL
  Administered 2022-01-30 – 2022-02-07 (×11): 2 via ORAL
  Filled 2022-01-26 (×21): qty 2

## 2022-01-26 MED ORDER — PROPOFOL 10 MG/ML IV BOLUS
INTRAVENOUS | Status: DC | PRN
  Administered 2022-01-26 (×3): 20 mg via INTRAVENOUS

## 2022-01-26 MED ORDER — PANTOPRAZOLE SODIUM 40 MG PO TBEC
40.0000 mg | DELAYED_RELEASE_TABLET | Freq: Every day | ORAL | Status: DC
  Administered 2022-01-27 – 2022-02-07 (×12): 40 mg via ORAL
  Filled 2022-01-26 (×12): qty 1

## 2022-01-26 MED ORDER — ONDANSETRON HCL 4 MG/2ML IJ SOLN
4.0000 mg | Freq: Once | INTRAMUSCULAR | Status: AC
  Administered 2022-01-26: 4 mg via INTRAVENOUS
  Filled 2022-01-26: qty 2

## 2022-01-26 MED ORDER — IOHEXOL 350 MG/ML SOLN
75.0000 mL | Freq: Once | INTRAVENOUS | Status: AC | PRN
  Administered 2022-01-26: 75 mL via INTRAVENOUS

## 2022-01-26 MED ORDER — SUGAMMADEX SODIUM 200 MG/2ML IV SOLN
INTRAVENOUS | Status: DC | PRN
  Administered 2022-01-26: 200 mg via INTRAVENOUS

## 2022-01-26 MED ORDER — ACETAMINOPHEN 325 MG PO TABS
650.0000 mg | ORAL_TABLET | ORAL | Status: DC | PRN
  Administered 2022-01-26 – 2022-02-07 (×10): 650 mg via ORAL
  Filled 2022-01-26 (×10): qty 2

## 2022-01-26 MED ORDER — ONDANSETRON HCL 4 MG/2ML IJ SOLN: 4.0000 mg | Freq: Four times a day (QID) | INTRAMUSCULAR | Status: DC | PRN

## 2022-01-26 MED ORDER — FENTANYL CITRATE (PF) 100 MCG/2ML IJ SOLN
INTRAMUSCULAR | Status: AC
  Filled 2022-01-26: qty 2

## 2022-01-26 MED ORDER — LOSARTAN POTASSIUM 50 MG PO TABS
50.0000 mg | ORAL_TABLET | Freq: Every day | ORAL | Status: DC
  Administered 2022-01-27 – 2022-02-07 (×12): 50 mg via ORAL
  Filled 2022-01-26 (×12): qty 1

## 2022-01-26 MED ORDER — HYDROMORPHONE HCL 1 MG/ML IJ SOLN
1.0000 mg | Freq: Once | INTRAMUSCULAR | Status: AC
  Administered 2022-01-26: 1 mg via INTRAVENOUS
  Filled 2022-01-26: qty 1

## 2022-01-26 MED ORDER — ONDANSETRON HCL 4 MG/2ML IJ SOLN
4.0000 mg | Freq: Four times a day (QID) | INTRAMUSCULAR | Status: DC | PRN
  Administered 2022-01-26 – 2022-01-30 (×8): 4 mg via INTRAVENOUS
  Filled 2022-01-26 (×8): qty 2

## 2022-01-26 MED ORDER — STROKE: EARLY STAGES OF RECOVERY BOOK
Freq: Once | Status: AC
  Administered 2022-01-27: 1
  Filled 2022-01-26: qty 1

## 2022-01-26 MED ORDER — SODIUM CHLORIDE 0.9 % IV SOLN: INTRAVENOUS | Status: DC

## 2022-01-26 MED ORDER — CHLORHEXIDINE GLUCONATE 0.12 % MT SOLN
OROMUCOSAL | Status: AC
  Filled 2022-01-26: qty 15

## 2022-01-26 MED ORDER — OXYCODONE HCL 5 MG PO TABS: 5.0000 mg | ORAL_TABLET | Freq: Once | ORAL | Status: DC | PRN

## 2022-01-26 MED ORDER — CHLORHEXIDINE GLUCONATE 0.12 % MT SOLN
15.0000 mL | Freq: Once | OROMUCOSAL | Status: AC
  Administered 2022-01-26: 15 mL via OROMUCOSAL

## 2022-01-26 MED ORDER — ETOMIDATE 2 MG/ML IV SOLN
INTRAVENOUS | Status: DC | PRN
  Administered 2022-01-26: 20 mg via INTRAVENOUS

## 2022-01-26 MED ORDER — LABETALOL HCL 5 MG/ML IV SOLN
20.0000 mg | Freq: Once | INTRAVENOUS | Status: AC
  Administered 2022-01-26: 20 mg via INTRAVENOUS
  Filled 2022-01-26: qty 4

## 2022-01-26 MED ORDER — CARVEDILOL 12.5 MG PO TABS
12.5000 mg | ORAL_TABLET | Freq: Two times a day (BID) | ORAL | Status: DC
  Administered 2022-01-27 – 2022-01-29 (×5): 12.5 mg via ORAL
  Filled 2022-01-26 (×5): qty 1

## 2022-01-26 MED ORDER — OXYCODONE HCL 5 MG/5ML PO SOLN: 5.0000 mg | Freq: Once | ORAL | Status: DC | PRN

## 2022-01-26 MED ORDER — NIMODIPINE 30 MG PO CAPS
60.0000 mg | ORAL_CAPSULE | ORAL | Status: DC
  Administered 2022-01-26 – 2022-02-07 (×71): 60 mg via ORAL
  Filled 2022-01-26 (×72): qty 2

## 2022-01-26 MED ORDER — MORPHINE SULFATE (PF) 2 MG/ML IV SOLN
2.0000 mg | INTRAVENOUS | Status: DC | PRN
  Administered 2022-01-26 – 2022-02-03 (×10): 2 mg via INTRAVENOUS
  Filled 2022-01-26 (×10): qty 1

## 2022-01-26 MED ORDER — PANTOPRAZOLE SODIUM 40 MG IV SOLR
40.0000 mg | Freq: Every day | INTRAVENOUS | Status: DC
  Administered 2022-01-26: 40 mg via INTRAVENOUS
  Filled 2022-01-26: qty 10

## 2022-01-26 MED ORDER — NIMODIPINE 6 MG/ML PO SOLN: 60.0000 mg | ORAL | Status: DC

## 2022-01-26 MED ORDER — NITROGLYCERIN 0.4 MG SL SUBL: 0.4000 mg | SUBLINGUAL_TABLET | SUBLINGUAL | Status: DC | PRN

## 2022-01-26 MED ORDER — FENOFIBRATE 160 MG PO TABS
160.0000 mg | ORAL_TABLET | Freq: Every day | ORAL | Status: DC
  Administered 2022-01-27 – 2022-02-07 (×12): 160 mg via ORAL
  Filled 2022-01-26 (×12): qty 1

## 2022-01-26 MED ORDER — ROCURONIUM BROMIDE 100 MG/10ML IV SOLN
INTRAVENOUS | Status: DC | PRN
  Administered 2022-01-26: 10 mg via INTRAVENOUS
  Administered 2022-01-26: 50 mg via INTRAVENOUS

## 2022-01-26 MED ORDER — SODIUM CHLORIDE 0.9 % IV SOLN: INTRAVENOUS | Status: DC | PRN

## 2022-01-26 MED ORDER — CLEVIDIPINE BUTYRATE 0.5 MG/ML IV EMUL
0.0000 mg/h | INTRAVENOUS | Status: DC
  Administered 2022-01-26: 27 mg/h via INTRAVENOUS
  Administered 2022-02-02: 14 mg/h via INTRAVENOUS
  Administered 2022-02-02: 2 mg/h via INTRAVENOUS
  Administered 2022-02-02: 14 mg/h via INTRAVENOUS
  Administered 2022-02-02: 10 mg/h via INTRAVENOUS
  Administered 2022-02-03: 22 mg/h via INTRAVENOUS
  Administered 2022-02-03: 17 mg/h via INTRAVENOUS
  Administered 2022-02-03: 30 mg/h via INTRAVENOUS
  Administered 2022-02-03: 26 mg/h via INTRAVENOUS
  Administered 2022-02-03 (×2): 32 mg/h via INTRAVENOUS
  Administered 2022-02-03: 18 mg/h via INTRAVENOUS
  Administered 2022-02-03: 24 mg/h via INTRAVENOUS
  Filled 2022-01-26 (×2): qty 100
  Filled 2022-01-26: qty 50
  Filled 2022-01-26: qty 100
  Filled 2022-01-26: qty 50
  Filled 2022-01-26 (×3): qty 100
  Filled 2022-01-26: qty 50
  Filled 2022-01-26 (×5): qty 100
  Filled 2022-01-26 (×3): qty 50

## 2022-01-26 MED ORDER — ACETAMINOPHEN 650 MG RE SUPP: 650.0000 mg | RECTAL | Status: DC | PRN

## 2022-01-26 MED ORDER — LIDOCAINE HCL (CARDIAC) PF 100 MG/5ML IV SOSY
PREFILLED_SYRINGE | INTRAVENOUS | Status: DC | PRN
  Administered 2022-01-26: 60 mg via INTRATRACHEAL

## 2022-01-26 MED ORDER — IOHEXOL 300 MG/ML  SOLN
150.0000 mL | Freq: Once | INTRAMUSCULAR | Status: AC | PRN
  Administered 2022-01-26: 64 mL via INTRA_ARTERIAL

## 2022-01-26 MED ORDER — FENTANYL CITRATE (PF) 100 MCG/2ML IJ SOLN: 25.0000 ug | INTRAMUSCULAR | Status: DC | PRN

## 2022-01-26 MED ORDER — CLEVIDIPINE BUTYRATE 0.5 MG/ML IV EMUL
0.0000 mg/h | INTRAVENOUS | Status: DC
  Administered 2022-01-26: 2 mg/h via INTRAVENOUS
  Filled 2022-01-26: qty 50

## 2022-01-26 MED ORDER — ONDANSETRON 4 MG PO TBDP
4.0000 mg | ORAL_TABLET | Freq: Four times a day (QID) | ORAL | Status: DC | PRN
  Administered 2022-01-27 – 2022-01-29 (×3): 4 mg via ORAL
  Filled 2022-01-26 (×4): qty 1

## 2022-01-26 MED ORDER — PHENYLEPHRINE HCL-NACL 20-0.9 MG/250ML-% IV SOLN
INTRAVENOUS | Status: DC | PRN
  Administered 2022-01-26: 40 ug/min via INTRAVENOUS

## 2022-01-26 MED ORDER — HYDROMORPHONE HCL 1 MG/ML IJ SOLN
0.5000 mg | Freq: Once | INTRAMUSCULAR | Status: AC
  Administered 2022-01-26: 0.5 mg via INTRAVENOUS
  Filled 2022-01-26: qty 0.5

## 2022-01-26 NOTE — Transfer of Care (Signed)
Immediate Anesthesia Transfer of Care Note  Patient: Joe [name]  Procedure(s) Performed: IR WITH ANESTHESIA  Patient Location: PACU  Anesthesia Type:General  Level of Consciousness: awake, alert , and oriented  Airway & Oxygen Therapy: Patient Spontanous Breathing and Patient connected to face mask oxygen  Post-op Assessment: Report given to RN and Post -op Vital signs reviewed and stable  Post vital signs: Reviewed and stable  Last Vitals:  Vitals Value Taken Time  BP 137/83 01/26/22 1816  Temp    Pulse 66 01/26/22 1822  Resp 16 01/26/22 1824  SpO2 93 % 01/26/22 1822  Vitals shown include unvalidated device data.  Last Pain:  Vitals:   01/26/22 1422  TempSrc: Oral  PainSc:          Complications: No notable events documented.

## 2022-01-26 NOTE — H&P (Addendum)
Chief Complaint   Chief Complaint  Patient presents with   Weakness   Migraine    History of Present Illness  Joe [name] is a 67 y.o. 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  He notes onset of severe headache.  By the time he got to the emergency department headache had somewhat improved.  He apparently had an acute change with somnolence while in the emergency department although has apparently again improved somewhat.  At the present time the patient reports some continued headache although improved from this morning.  He also reports blurry vision.  No complaint of numbness tingling or weakness of the extremities.  Of note, the patient does have a history of congestive heart failure and previous heart attack with stent angioplasty more than 10 years ago.  Patient also has a history of ventricular fibrillation associated with his heart attack for which he underwent placement of an AICD.  He is currently maintained on aspirin.  Patient also has a history of dyslipidemia.  He is an active smoker.  No known family history of intracranial aneurysms.  Past Medical History   Past Medical History:  Diagnosis Date   Chronic systolic CHF (congestive heart failure), NYHA class 1 (HCC)    EF 37% by nuclear stress test   Coronary artery disease    s/p PCI mild LAD (BMS) in setting of AWMI complicated by Vfib arrest with 40-50% residual disesae in left circ   Hyperlipidemia    Ischemic cardiomyopathy    A. 01/09/2011 - s/p St. Jude Fortify ST VR [place] AICD   OSA (obstructive sleep apnea)    severe with AHI 26/hr intolerant to CPAP   VF (ventricular fibrillation) ([place])    arrest in setting of AMI 6/11    Past Surgical History   Past Surgical History:  Procedure Laterality Date   BACK SURGERY     BACK SURGERY     IMPLANTABLE CARDIOVERTER DEFIBRILLATOR IMPLANT N/A 01/09/2011   Procedure:  IMPLANTABLE CARDIOVERTER DEFIBRILLATOR IMPLANT;  Surgeon: Evans Lance, MD;  Location: [facility] CATH LAB;  Service: Cardiovascular;  Laterality: N/A;   KNEE ARTHROSCOPY     right   PACEMAKER INSERTION  2012   St Jude    Social History   Social History   Tobacco Use   Smoking status: Every Day    Packs/day: 1.00    Years: 34.00    Total pack years: 34.00    Types: Cigarettes    Last attempt to quit: 06/13/2009    Years since quitting: 12.6   Smokeless tobacco: Never   Tobacco comments:    Socially  Substance Use Topics   Alcohol use: Yes    Comment: occasional   Drug use: No    Medications   Prior to Admission medications   Medication Sig Start Date End Date Taking? Authorizing Provider  aspirin 81 MG tablet Take 81 mg by mouth daily.     Yes [provider]  carvedilol (COREG) 12.5 MG tablet Take 1 tablet (12.5 mg total) by mouth 2 (two) times daily with a meal. 11/12/21  Yes Baldwin Jamaica, PA-C  Evolocumab (REPATHA SURECLICK) 998 MG/ML SOAJ Inject 1 pen into the skin every 14 (fourteen) days. 01/31/21   Evans Lance, MD  fenofibrate 160 MG tablet Take 1 tablet (160 mg total) by mouth daily. 11/12/21   Baldwin Jamaica, PA-C  fish oil-omega-3  fatty acids 1000 MG capsule Take 4 g by mouth daily.     [provider]  losartan (COZAAR) 50 MG tablet TAKE 1 TABLET(50 MG) BY MOUTH DAILY 11/12/21   Baldwin Jamaica, PA-C  nitroGLYCERIN (NITROSTAT) 0.4 MG SL tablet Place 1 tablet (0.4 mg total) under the tongue every 5 (five) minutes as needed for chest pain. Up to 3 doses 11/12/21   Baldwin Jamaica, PA-C    Allergies   Allergies  Allergen Reactions   Isosorbide Dinitrate Other (See Comments)    headache  headache   Rosuvastatin Other (See Comments)    Muscle aches on 5 mg qd and 20 mg qd Muscle aches on 5 mg qd and 20 mg qd Muscle aches on 5 mg qd and 20 mg qd   Atorvastatin Anxiety    Made his irritable and anxious Made his irritable and anxious     Review of Systems  ROS  Neurologic Exam  Drowsy but easily arouses, oriented, able to provide history Speech fluent, appropriate CN grossly intact Motor exam: Upper Extremities Deltoid Bicep Tricep Grip  Right 5/5 5/5 5/5 5/5  Left 5/5 5/5 5/5 5/5   Lower Extremities IP Quad PF DF EHL  Right 5/5 5/5 5/5 5/5 5/5  Left 5/5 5/5 5/5 5/5 5/5   Sensation grossly intact to LT  Imaging  CT scan of the head was personally reviewed and demonstrates diffuse basal subarachnoid hemorrhage.  There is a small amount of blood within the fourth ventricle.  There are prominent temporal horns but no overt hydrocephalus.  CT angiogram of the head also personally reviewed.  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 I do not identify any other intracranial aneurysms.  Vertebral arteries appear to be codominant, although the right is somewhat larger than the left.  There is some calcific ostial disease at the origin of the right vertebral artery.  Impression  - 67 y.o. 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 every 4 hours x 21 days -Systolic blood pressure less than 140 mmHg  I have reviewed the situation with the patient.  We have discussed the imaging findings and the need for aneurysm treatment.  I have reviewed the details of the procedure as well as the associated risks with the patient.  All of his questions were answered.  He provided consent to proceed. I have also reviewed the situation with the patient's son and daughter who is a Immunologist in [place] over the phone including the details above. All their questions were answered.   Consuella Lose, MD [facility]

## 2022-01-26 NOTE — ED Notes (Signed)
ED Provider at bedside. 

## 2022-01-26 NOTE — Anesthesia Preprocedure Evaluation (Signed)
Anesthesia Evaluation  Patient identified by MRN, date of birth, ID band Patient awake    Reviewed: Allergy & Precautions, H&P , NPO status , Patient's Chart, lab work & pertinent test results  Airway Mallampati: II   Neck ROM: full    Dental   Pulmonary sleep apnea , Current Smoker   breath sounds clear to auscultation       Cardiovascular hypertension, + CAD, + Past MI, + Cardiac Stents and +CHF  + Cardiac Defibrillator  Rhythm:regular Rate:Normal  TTE (2017): EF 35%   Neuro/Psych Subarachnoid hemorrhage    GI/Hepatic   Endo/Other    Renal/GU      Musculoskeletal   Abdominal   Peds  Hematology   Anesthesia Other Findings   Reproductive/Obstetrics                             Anesthesia Physical Anesthesia Plan  ASA: 3 and emergent  Anesthesia Plan: General   Post-op Pain Management:    Induction: Intravenous  PONV Risk Score and Plan: 1 and Ondansetron, Dexamethasone, Midazolam and Treatment may vary due to age or medical condition  Airway Management Planned: Oral ETT  Additional Equipment: Arterial line  Intra-op Plan:   Post-operative Plan: Extubation in OR and Possible Post-op intubation/ventilation  Informed Consent: I have reviewed the patients History and Physical, chart, labs and discussed the procedure including the risks, benefits and alternatives for the proposed anesthesia with the patient or authorized representative who has indicated his/her understanding and acceptance.     Dental advisory given  Plan Discussed with: CRNA, Anesthesiologist and Surgeon  Anesthesia Plan Comments:        Anesthesia Quick Evaluation

## 2022-01-26 NOTE — ED Triage Notes (Signed)
Pt BIB [facility] from home after sudden onset weakness, N/V, diaphoresis, severe headache. Pt endorses bending over and headache began when he stood back up. Hypertensive with EMS.  CBG 117 pta 18 g L AC Pt has a pacemaker, hx of "widow maker" about 15 years ago.  ED provider was asked to come assess pt by [facility], provider stated he would get to him when he could.

## 2022-01-26 NOTE — Brief Op Note (Signed)
  NEUROSURGERY BRIEF OPERATIVE  NOTE   PREOP DX: Subarachnoid Hemorrhage  POSTOP DX: Same  PROCEDURE: Diagnostic cerebral angiogram, coil embolization of basilar apex aneurysm  SURGEON: Dr. Consuella Lose, MD  ANESTHESIA: GETA  APPROACH: Right trans-femoral  EBL: Minimal  SPECIMENS: None  COMPLICATIONS: None  CONDITION: Stable to recovery  FINDINGS (Full report in [facility]): 1. Successful coil embolization of basilar apex aneurysm 2. No other aneurysms, AVM, or fistulas seen.  3. No vasospasm seen   Consuella Lose, MD [facility]

## 2022-01-26 NOTE — ED Notes (Signed)
Pt not responding verbally to staff, but has his eyes open. Pt crying. Pt will follow commands to squeeze my hand when asked. Pt taken directly to CT with Chalmers Guest, RN at bedside. CT called and made aware of need for stat CT due to sudden decline in mental status.

## 2022-01-26 NOTE — ED Notes (Signed)
[facility] on unit to transport pt to [facility].

## 2022-01-26 NOTE — Anesthesia Procedure Notes (Signed)
Procedure Name: Intubation Date/Time: 01/26/2022 4:25 PM  Performed by: Clovis Cao, CRNAPre-anesthesia Checklist: Patient identified, Emergency Drugs available, Suction available and Patient being monitored Patient Re-evaluated:Patient Re-evaluated prior to induction Oxygen Delivery Method: Circle system utilized Preoxygenation: Pre-oxygenation with 100% oxygen Induction Type: IV induction, Rapid sequence and Cricoid Pressure applied Laryngoscope Size: Miller and 2 Grade View: Grade I Tube type: Oral Tube size: 8.0 mm Number of attempts: 1 Airway Equipment and Method: Stylet Placement Confirmation: ETT inserted through vocal cords under direct vision, positive ETCO2 and breath sounds checked- equal and bilateral Secured at: 22 cm Tube secured with: Tape Dental Injury: Teeth and Oropharynx as per pre-operative assessment

## 2022-01-26 NOTE — Plan of Care (Signed)
Called by EDP-patient presented with severe headache.  CT head with aneurysmal subarachnoid.  Recommended stat neurosurgical consultation and stat CT angiography head and neck. Further management and admission per neurosurgery.   -- Amie Portland, MD Neurologist [facility] Pager: [phone]

## 2022-01-26 NOTE — ED Provider Notes (Addendum)
[facility] EMERGENCY DEPARTMENT Provider Note   CSN: [id number] Arrival date & time: 01/26/22  1144     History  Chief Complaint  Patient presents with   Weakness   Migraine    Joe [name] is a 66 y.o. male.  Patient brought in by EMS.  Patient was out at his property locally.  He bent over stood up got a severe headache and felt dizzy.  When patient first arrived here I saw him out in the hallway 2.  Headache then was 2 out of 10 it had been much worse.  And patient had no neurodeficit and was mentating fine.  Patient was then moved over to bed 16 in the process of moving to that patient became nonverbal.  And was rushed to CT.  Had already put in the code stroke order set.  But patient had not had anything done yet.  Head CT done I was present to see that consistent with subarachnoid hemorrhage with blood on the base of the brain probably due to aneurysmal bleed.  We did not have vital signs or anything on the patient at this point in time.  Patient brought back to 16.  Blood pressure is a little elevated so started the labetalol and clevopine.  His mental status he is a little more foggy at this point in time.  Past medical history significant for ischemic cardiomyopathy.  History of atrial fibrillation during arrest in 2011.  Hyperlipidemia obstructive sleep apnea chronic systolic congestive heart failure.  Patient is an everyday smoker.        Home Medications Prior to Admission medications   Medication Sig Start Date End Date Taking? Authorizing Provider  aspirin 81 MG tablet Take 81 mg by mouth daily.      [provider]  carvedilol (COREG) 12.5 MG tablet Take 1 tablet (12.5 mg total) by mouth 2 (two) times daily with a meal. 11/12/21   Baldwin Jamaica, PA-C  Evolocumab (REPATHA SURECLICK) 063 MG/ML SOAJ Inject 1 pen into the skin every 14 (fourteen) days. 01/31/21   Evans Lance, MD  fenofibrate 160 MG tablet Take 1 tablet (160 mg total) by mouth daily.  11/12/21   Baldwin Jamaica, PA-C  fish oil-omega-3 fatty acids 1000 MG capsule Take 4 g by mouth daily.     [provider]  losartan (COZAAR) 50 MG tablet TAKE 1 TABLET(50 MG) BY MOUTH DAILY 11/12/21   Baldwin Jamaica, PA-C  nitroGLYCERIN (NITROSTAT) 0.4 MG SL tablet Place 1 tablet (0.4 mg total) under the tongue every 5 (five) minutes as needed for chest pain. Up to 3 doses 11/12/21   Baldwin Jamaica, PA-C      Allergies    Isosorbide dinitrate, Rosuvastatin, and Atorvastatin    Review of Systems   Review of Systems  Constitutional:  Negative for chills and fever.  HENT:  Negative for rhinorrhea and sore throat.   Eyes:  Negative for visual disturbance.  Respiratory:  Negative for cough and shortness of breath.   Cardiovascular:  Negative for chest pain and leg swelling.  Gastrointestinal:  Negative for abdominal pain, diarrhea, nausea and vomiting.  Genitourinary:  Negative for dysuria.  Musculoskeletal:  Negative for back pain and neck pain.  Skin:  Negative for rash.  Neurological:  Positive for dizziness and headaches. Negative for light-headedness.  Hematological:  Does not bruise/bleed easily.  Psychiatric/Behavioral:  Negative for confusion.     Physical Exam Updated Vital Signs BP (!) 147/84  Pulse (!) 50   Temp 97.6 F (36.4 C) (Oral)   Resp 13   SpO2 (!) 87% Comment: placed on [place] '@4L'$  Physical Exam Vitals and nursing note reviewed.  Constitutional:      General: He is in acute distress.     Appearance: Normal appearance. He is well-developed.  HENT:     Head: Normocephalic and atraumatic.  Eyes:     Extraocular Movements: Extraocular movements intact.     Conjunctiva/sclera: Conjunctivae normal.  Cardiovascular:     Rate and Rhythm: Normal rate and regular rhythm.     Heart sounds: No murmur heard. Pulmonary:     Effort: Pulmonary effort is normal. No respiratory distress.     Breath sounds: Normal breath sounds.  Abdominal:     Palpations:  Abdomen is soft.     Tenderness: There is no abdominal tenderness.  Musculoskeletal:        General: No swelling.     Cervical back: Neck supple.  Skin:    General: Skin is warm and dry.     Capillary Refill: Capillary refill takes less than 2 seconds.  Neurological:     General: No focal deficit present.     Mental Status: He is alert and oriented to person, place, and time.     Cranial Nerves: No cranial nerve deficit.     Sensory: No sensory deficit.     Motor: No weakness.  Psychiatric:        Mood and Affect: Mood normal.     ED Results / Procedures / Treatments   Labs (all labs ordered are listed, but only abnormal results are displayed) Labs Reviewed  CBC - Abnormal; Notable for the following components:      Result Value   WBC 14.1 (*)    Hemoglobin 17.1 (*)    All other components within normal limits  DIFFERENTIAL - Abnormal; Notable for the following components:   Neutro Abs 8.7 (*)    Lymphs Abs 4.2 (*)    Basophils Absolute 0.2 (*)    Abs Immature Granulocytes 0.08 (*)    All other components within normal limits  COMPREHENSIVE METABOLIC PANEL - Abnormal; Notable for the following components:   Glucose, Bld 151 (*)    All other components within normal limits  I-STAT CHEM 8, ED - Abnormal; Notable for the following components:   Glucose, Bld 145 (*)    All other components within normal limits  ETHANOL  PROTIME-INR  APTT  RAPID URINE DRUG SCREEN, HOSP PERFORMED  URINALYSIS, ROUTINE W REFLEX MICROSCOPIC    EKG EKG Interpretation  Date/Time:  Sunday January 26 2022 12:00:56 EST Ventricular Rate:  60 PR Interval:  204 QRS Duration: 162 QT Interval:  436 QTC Calculation: 436 R Axis:   -71 Text Interpretation: Normal sinus rhythm Left axis deviation Non-specific intra-ventricular conduction block Left ventricular hypertrophy with repolarization abnormality ( R in aVL , Cornell product , Romhilt-Estes ) Cannot rule out Anterior infarct (cited on or  before 13-Jun-2009) Abnormal ECG When compared with ECG of 14-Jun-2009 03:19, QRS duration has increased T wave inversion now evident in Lateral leads Confirmed by Fredia Sorrow [phone]) on 01/26/2022 12:08:21 PM  Radiology CT HEAD WO CONTRAST  Result Date: 01/26/2022 CLINICAL DATA:  67 year old male with sudden severe headache, decreased responsiveness now. Reportedly an onset of severe headache at 11 a.m. when bending over. EXAM: CT HEAD WITHOUT CONTRAST TECHNIQUE: Contiguous axial images were obtained from the base of the skull through the vertex  without intravenous contrast. RADIATION DOSE REDUCTION: This exam was performed according to the departmental dose-optimization program which includes automated exposure control, adjustment of the mA and/or kV according to patient size and/or use of iterative reconstruction technique. COMPARISON:  None Available. FINDINGS: Brain: Large volume acute subarachnoid hemorrhage. Basilar cistern predominance in an aneurysmal rupture pattern. Small to moderate volume intraventricular hemorrhage with mild generalized ventriculomegaly. Probably some early transependymal edema along the lateral temporal horns. No midline shift. Blood throughout the basilar cisterns. No intra-axial hemorrhage or hematoma identified. No subdural blood identified. No superimposed No cortically based acute infarct identified. Vascular: Calcified atherosclerosis at the skull base. Skull: Intact, No acute osseous abnormality identified. Sinuses/Orbits: Visible paranasal sinuses are well aerated. Tympanic cavities are clear. There is a mild left mastoid effusion. Other: Visualized scalp soft tissues are within normal limits. Visualized orbit soft tissues are within normal limits. IMPRESSION: 1. Large volume of Acute Subarachnoid Hemorrhage in an Aneurysmal Rupture Pattern. 2. Intraventricular blood with mild acute generalized ventriculomegaly. 3. No midline shift. No intra-axial or subdural hematoma  identified. Critical Value/emergent results were called by telephone at the time of interpretation on 01/26/2022 at 12:35 pm to Dr. Fredia Sorrow , who verbally acknowledged these results. Electronically Signed   By: Genevie Ann M.D.   On: 01/26/2022 12:38    Procedures Procedures    Medications Ordered in ED Medications  labetalol (NORMODYNE) injection 20 mg (20 mg Intravenous Given 01/26/22 1254)    And  clevidipine (CLEVIPREX) infusion 0.5 mg/mL (has no administration in time range)  iohexol (OMNIPAQUE) 350 MG/ML injection 75 mL (has no administration in time range)  HYDROmorphone (DILAUDID) injection 1 mg (1 mg Intravenous Given 01/26/22 1252)  ondansetron (ZOFRAN) injection 4 mg (4 mg Intravenous Given 01/26/22 1249)    ED Course/ Medical Decision Making/ A&P                             Medical Decision Making Amount and/or Complexity of Data Reviewed Labs: ordered. Radiology: ordered.  Risk Prescription drug management.   CRITICAL CARE Performed by: Fredia Sorrow Total critical care time: 95 minutes Critical care time was exclusive of separately billable procedures and treating other patients. Critical care was necessary to treat or prevent imminent or life-threatening deterioration. Critical care was time spent personally by me on the following activities: development of treatment plan with patient and/or surrogate as well as nursing, discussions with consultants, evaluation of patient's response to treatment, examination of patient, obtaining history from patient or surrogate, ordering and performing treatments and interventions, ordering and review of laboratory studies, ordering and review of radiographic studies, pulse oximetry and re-evaluation of patient's condition.  CT head done through the stroke order set consistent with subarachnoid hemorrhage blood on the base of the brain fairly extensive most likely aneurysmal bleed.  Patient started on blood pressure control  per protocol.  Patient started on the Cleviprex and started on labetalol.  Patient received labetalol initially.  Dropping his heart rate down some.  Pressures still currently at 150 so the Cleviprex will be started.  Patient's mentation still good.  Patient did receive Zofran and hydromorphone for the pain.  Contacted neurology and neurosurgery.  Spoke to Dr. Arnoldo Morale.  Neurology recommended getting CT angio head and neck which has been ordered and is completed but results pending.  Spoke to Dr. Arnoldo Morale neurosurgery will transfer patient ED to ED to [facility].  Carol ankle aware.  Excepting ED physician  is Dr. Maryan Rued and Dr. Melina Copa.  Currently patient maintaining his airway fine.  Will monitor the bradycardia.   Final Clinical Impression(s) / ED Diagnoses Final diagnoses:  Subarachnoid hemorrhage [facility])    Rx / DC Orders ED Discharge Orders     None         Fredia Sorrow, MD 01/26/22 1329   Addendum:   Care currently to transport him.  He remains neurologically stable.  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  Dr. Arnoldo Morale must be aware because he is scheduled the patient to go to preop.  [facility] is taking him to preop.  So he will go ahead and bypass the ED at [facility].  Which is very appropriate.     Fredia Sorrow, MD 01/26/22 1419

## 2022-01-26 NOTE — Anesthesia Procedure Notes (Signed)
Arterial Line Insertion Start/End1/14/2024 4:00 PM, 01/26/2022 4:20 PM Performed by: Griffin Dakin, RN, CRNA  Patient location: OOR procedure area. Preanesthetic checklist: patient identified, IV checked, site marked, risks and benefits discussed, surgical consent, monitors and equipment checked, pre-op evaluation, timeout performed and anesthesia consent Lidocaine 1% used for infiltration Left, radial was placed Catheter size: 20 G Hand hygiene performed  and maximum sterile barriers used   Attempts: 1 Procedure performed without using ultrasound guided technique. Ultrasound Notes:anatomy identified, needle tip was noted to be adjacent to the nerve/plexus identified and no ultrasound evidence of intravascular and/or intraneural injection Following insertion, dressing applied and Biopatch. Post procedure assessment: normal and unchanged  Patient tolerated the procedure well with no immediate complications.

## 2022-01-26 NOTE — ED Notes (Signed)
Dr Rory Percy paged to Dr Rogene Houston @ [phone]

## 2022-01-26 NOTE — ED Notes (Signed)
Pt transported to CTA

## 2022-01-27 ENCOUNTER — Inpatient Hospital Stay (HOSPITAL_COMMUNITY): Payer: Medicare HMO

## 2022-01-27 ENCOUNTER — Encounter (HOSPITAL_COMMUNITY): Payer: Self-pay | Admitting: Neurosurgery

## 2022-01-27 DIAGNOSIS — I608 Other nontraumatic subarachnoid hemorrhage: Secondary | ICD-10-CM | POA: Diagnosis not present

## 2022-01-27 LAB — HIV ANTIBODY (ROUTINE TESTING W REFLEX): HIV Screen 4th Generation wRfx: NONREACTIVE

## 2022-01-27 MED ORDER — CHLORHEXIDINE GLUCONATE CLOTH 2 % EX PADS
6.0000 | MEDICATED_PAD | Freq: Every day | CUTANEOUS | Status: DC
  Administered 2022-01-27 – 2022-02-07 (×12): 6 via TOPICAL

## 2022-01-27 NOTE — Evaluation (Signed)
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 01/09/2011 - s/p St[employer] Jude Fortify ST [employer] [place] AICD   OSA (obstructive sleep apnea)    severe with AHI 26/hr intolerant to CPAP   VF (ventricular fibrillation) ([place])    arrest in setting of AMI 6/11   Past Surgical History:  Past Surgical History:  Procedure Laterality Date   BACK SURGERY     BACK SURGERY     IMPLANTABLE CARDIOVERTER DEFIBRILLATOR IMPLANT N/A 01/09/2011   Procedure: IMPLANTABLE CARDIOVERTER DEFIBRILLATOR IMPLANT;  Surgeon: Evans Lance, MD;  Location[facility] C[facility] LAB;  Service: Cardiovascular;  Laterality: N/A;   IR ANGIO INTRA EXTRACRAN SEL INTERNAL CAROTID BILAT MOD SED  01/26/2022   IR ANGIO VERTEBRAL SEL VERTEBRAL UNI R MOD SED  01/26/2022   IR ANGIOGRAM FOLLOW UP STUDY  01/26/2022   IR NEURO EACH ADD'L AFTER BASIC UNI LEFT (MS)  01/26/2022   IR TRANSCATH/EMBOLIZ  01/26/2022   KNEE ARTHROSCOPY     right    PACEMAKER INSERTION  2012   St Jude   HPI:  Pt is a 67 y.o. male who presented with sudden onset of headache. CT head: large volume of acute subarachnoid hemorrhage in an aneurysmal rupture pattern. 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 PMH: CHF, heart attack, OSA, dyslipidemia.   Assessment / Plan / Recommendation Clinical Impression  Pt participated in speech-language-cognition evaluation. He reported that he completed high school, is retired, and lives alone. Pt denied any baseline deficits in speech, language, or cognition. He reported that his speech is not as clear as at baseline and indicated that he believes it is now approximately 80% back to baseline. [facility] Mental Status Examination was completed to evaluate the pt's cognitive-linguistic skills. He achieved a score of 14/30 which is below the normal limits of 27 or more out of 30. He exhibited deficits in the areas of awareness, attention, memory, and executive function. He also presented with mild dysarthria characterized by reduced articulatory precision which negatively impacted speech intelligibility at the sentence and conversational levels. Skilled SLP services are clinically indicated at this time to improve motor speech and cognitive-linguistic function.    SLP Assessment  SLP Recommendation/Assessment: Patient needs continued Speech Lanaguage Pathology Services SLP Visit Diagnosis: Cognitive communication deficit (R41.841);Dysarthria and anarthria (R47.1)    Recommendations for follow up therapy are one component of a multi-disciplinary discharge planning process, led by the attending physician.  Recommendations may be  updated based on patient status, additional functional criteria and insurance authorization.    Follow Up Recommendations   (Continued SLP services at level of care recommended by PT/OT)    Assistance Recommended at Discharge  Intermittent Supervision/Assistance  Functional  Status Assessment Patient has had a recent decline in their functional status and demonstrates the ability to make significant improvements in function in a reasonable and predictable amount of time.  Frequency and Duration min 2x/week  2 weeks      SLP Evaluation Cognition  Overall Cognitive Status: Impaired/Different from baseline Arousal/Alertness: Awake/alert Orientation Level: Oriented to person;Oriented to place;Oriented to time;Disoriented to situation Year: 2024 Month: January Day of Week: Correct Attention: Focused;Sustained Focused Attention: Appears intact Memory: Impaired Memory Impairment: Storage deficit;Decreased recall of new information (Immediate: 5/5; delayed: 0/5; with cues: 2/5; paragraph: 2/8) Awareness: Impaired Awareness Impairment: Emergent impairment Problem Solving: Impaired Problem Solving Impairment: Verbal complex (money: 3/3 with self correction; time: 0/2) Executive Function: Sequencing;Organizing Sequencing: Impaired Sequencing Impairment: Verbal complex (clock: 0/4) Organizing: Impaired Organizing Impairment: Verbal complex (backward digit span: 2/2 with additional processing time and self-correction)       Comprehension  Auditory Comprehension Overall Auditory Comprehension: Appears within functional limits for tasks assessed Yes/No Questions: Within Functional Limits Commands: Within Functional Limits Conversation: Complex Interfering Components: Attention;Working memory;Processing speed    Expression Expression Primary Mode of Expression: Verbal Verbal Expression Overall Verbal Expression: Appears within functional limits for tasks assessed Initiation: No impairment Level of Generative/Spontaneous Verbalization: Conversation Repetition: No impairment Naming: No impairment Written Expression Dominant Hand: Right   Oral / Motor  Oral Motor/Sensory Function Overall Oral Motor/Sensory Function: Mild impairment Lingual Strength:  Reduced;Suspected CN XII (hypoglossal) dysfunction Motor Speech Overall Motor Speech: Impaired Respiration: Within functional limits Phonation: Low vocal intensity Resonance: Within functional limits Articulation: Impaired Level of Impairment: Conversation Intelligibility: Intelligibility reduced Word: 75-100% accurate Phrase: 75-100% accurate Sentence: 50-74% accurate Conversation: 50-74% accurate Motor Planning: Witnin functional limits Motor Speech Errors: Aware;Consistent           Tesa Meadors I. Hardin Negus, [place], [place] Office number [phone]  [name] 01/27/2022, 10:54 AM

## 2022-01-27 NOTE — Progress Notes (Signed)
Pt's arterial line dampened, and reading far from cuff pressure SBP. Called neurosurgery. Ordered to remove arterial line, and titrate blood pressure drip by the cuff pressure.

## 2022-01-27 NOTE — Evaluation (Signed)
Occupational Therapy Evaluation Patient Details Name: Joe [name] MRN: [id number] DOB: [birth year] Today's Date: 01/27/2022   History of Present Illness Patient is a 67 y/o male who presents on 1/14 with sudden onset of weakness, N/V, HA, diaphoresis. 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 PMH includes CHF, VF, OSA, AICD.   Clinical Impression   Pt currently min guard for simulated selfcare tasks sit to stand and for functional transfers simulated to the toilet.  He reported some diplopia with distal vision on the left side when looking at the clock with slight jerky tracking noted during scanning.  Peripheral fields intact with gross testing however he brushed up against multiple objects on the right side of the hall and when walking in his room.  Feel he will benefit from acute care OT at this time with recommendation for follow-up HHOT and 24 hr supervision based on results today.  Will update discharge recommendations as he progresses if 24 hr not recommended.  Pt lives alone and would have to arrange 24 hr if still needed.        Recommendations for follow up therapy are one component of a multi-disciplinary discharge planning process, led by the attending physician.  Recommendations may be updated based on patient status, additional functional criteria and insurance authorization.   Follow Up Recommendations  Home health OT     Assistance Recommended at Discharge Frequent or constant Supervision/Assistance  Patient can return home with the following Assistance with cooking/housework;Assist for transportation;Help with stairs or ramp for entrance    Functional Status Assessment  Patient has had a recent decline in their functional status and demonstrates the ability to make significant improvements in function in a reasonable and predictable amount of time.  Equipment Recommendations  None recommended by OT        Precautions / Restrictions Precautions Precautions: Fall;Other (comment) Precaution Comments: keep SBP <160 Restrictions Weight Bearing Restrictions: No      Mobility Bed Mobility Overal bed mobility: Needs Assistance Bed Mobility: Supine to Sit     Supine to sit: Supervision          Transfers Overall transfer level: Needs assistance Equipment used: None Transfers: Sit to/from Stand, Bed to chair/wheelchair/BSC Sit to Stand: Min guard     Step pivot transfers: Min guard     General transfer comment: Pt used UEs to push up from the surface with sit to stand.      Balance Overall balance assessment: Needs assistance Sitting-balance support: Feet supported Sitting balance-Leahy Scale: Good     Standing balance support: During functional activity, No upper extremity supported Standing balance-Leahy Scale: Fair Standing balance comment: Pt able to stand statically with supervsion and no UE support, min guard for mobility and transfers without an assistive device.                           ADL either performed or assessed with clinical judgement   ADL Overall ADL's : Needs assistance/impaired Eating/Feeding: Set up;Sitting   Grooming: Wash/dry face;Standing;Min guard Grooming Details (indicate cue type and reason): simulated Upper Body Bathing: Set up;Sitting   Lower Body Bathing: Min guard;Sit to/from stand   Upper Body Dressing : Set up;Sitting   Lower Body Dressing: Min guard;Sit to/from stand   Toilet Transfer: Min guard;Ambulation Toilet Transfer Details (indicate cue type and reason): simulated without device Toileting- Clothing Manipulation and Hygiene: Sit to/from stand;Min guard Toileting -  Clothing Manipulation Details (indicate cue type and reason): simulated     Functional mobility during ADLs: Min guard (no device) General ADL Comments: Pt with some dizziness noted with transition to sitting and standing which remained while  ambulating.  BP at 128/71 in supine and then 156/85 in sitting.  Pt perseverating on needing to urinate, even though catheter was in place.  Diplopia reported with far vision on the left but not stated after one episode.     Vision Baseline Vision/History: 1 Wears glasses (reading glasses) Ability to See in Adequate Light: 0 Adequate Patient Visual Report: Diplopia;Other (comment) (he did report slight diplopia when looking at the clock on the left side) Vision Assessment?: Yes Eye Alignment: Within Functional Limits Ocular Range of Motion: Within Functional Limits Alignment/Gaze Preference: Within Defined Limits Tracking/Visual Pursuits:  (slight jerkiness with tracking horizontally.) Saccades: Within functional limits Convergence: Within functional limits Visual Fields:  (TBA further) Additional Comments: Noted pt brushing up to items on the right side during mobility, however gross testing did not show left visual field defict.  Will continue to assess in treatment.         Hand Dominance Right   Extremity/Trunk Assessment Upper Extremity Assessment Upper Extremity Assessment: Overall WFL for tasks assessed   Lower Extremity Assessment Lower Extremity Assessment: Defer to PT evaluation   Cervical / Trunk Assessment Cervical / Trunk Assessment: Normal   Communication Communication Communication: No difficulties   Cognition Arousal/Alertness: Awake/alert Behavior During Therapy: WFL for tasks assessed/performed Overall Cognitive Status: Impaired/Different from baseline Area of Impairment: Orientation, Memory, Safety/judgement, Awareness, Problem solving (needed cueing and increased time for day of the week)                     Memory: Decreased short-term memory   Safety/Judgement: Decreased awareness of deficits Awareness: Intellectual Problem Solving: Requires verbal cues General Comments: Pt able to state 2/3 words after 2 min recall.  Perseverated on feeling  like he needed to pee throughout session even though he was told he had the catheter in and it was draining automatically.                Home Living Family/patient expects to be discharged to:: Private residence Living Arrangements: Alone Available Help at Discharge:  (son is close but works) Type of Home: House Home Access: Stairs to enter Technical brewer of Steps: 3 Entrance Stairs-Rails: Left;Right;Can reach both [place]: One level     Bathroom Shower/Tub: Occupational psychologist: Standard Bathroom Accessibility: Yes How Accessible: Accessible via [place]: Conservation officer, nature (2 wheels);Shower seat          Prior Functioning/Environment Prior Level of Function : Independent/Modified Independent                        OT Problem List: Impaired balance (sitting and/or standing);Decreased safety awareness;Decreased cognition;Impaired vision/perception;Decreased knowledge of use of DME or AE;Pain      OT Treatment/Interventions: Self-care/ADL training;Balance training;Therapeutic activities;DME and/or AE instruction;Cognitive remediation/compensation;Neuromuscular education;Patient/family education    OT Goals(Current goals can be found in the care plan section) Acute Rehab OT Goals Patient Stated Goal: Pt did not state but agreeable to working with OT. OT Goal Formulation: With patient Time For Goal Achievement: 02/10/22 Potential to Achieve Goals: Good  OT Frequency: Min 2X/week       AM-PAC OT "6 Clicks" Daily Activity     Outcome Measure Help from another person eating  meals?: None Help from another person taking care of personal grooming?: A Little Help from another person toileting, which includes using toliet, bedpan, or urinal?: A Little Help from another person bathing (including washing, rinsing, drying)?: A Little Help from another person to put on and taking off regular upper body clothing?: A Little Help from  another person to put on and taking off regular lower body clothing?: A Little 6 Click Score: 19   End of Session Equipment Utilized During Treatment: Gait belt Nurse Communication: Mobility status  Activity Tolerance: Patient tolerated treatment well Patient left: in chair;with call bell/phone within reach;Other (comment) (with PT in for next session)  OT Visit Diagnosis: Unsteadiness on feet (R26.81);Pain;Muscle weakness (generalized) (M62.81);Other abnormalities of gait and mobility (R26.89) Pain - part of body:  (headache)                Time: 5329-9242 OT Time Calculation (min): 41 min Charges:  OT General Charges $OT Visit: 1 Visit OT Evaluation $OT Eval Moderate Complexity: 1 Mod OT Treatments $Self Care/Home Management : 23-37 mins  Gracyn Allor OTR/L 01/27/2022, 11:45 AM

## 2022-01-27 NOTE — Evaluation (Addendum)
Physical Therapy Evaluation Patient Details Name: Joe [name] MRN: [id number] DOB: 04-Mar-1955 Today's Date: 01/27/2022  History of Present Illness  Patient is a 67 y/o male who presents on 1/14 with sudden onset of weakness, N/V, HA, diaphoresis. 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 PMH includes CHF, VF, OSA, AICD.  Clinical Impression  Patient presents with headache, nausea, dizziness, generalized weakness, decreased activity tolerance, impaired balance and impaired mobility s/p above. Pt lives alone and is independent for ADLs/IADLs and ambulation PTA. Today, pt requires Min guard-Min A for transfers and gait training using IV pole for support. Distance limited due to sudden worsening of headache/nausea with walking resulting in dry heaving and needing to sit. Also noted to have elevated BP with walking.   Pre activity BP 152/78 BP with walking 172/88 (worsened nausea/headache/dry heaving). BP post activity in supine 153/77.   RN made aware of need for pain meds and BP. Pt also reports worsened headache with changes in position esp sit to stand. Noted to have some cognitive deficits relating to attention, awareness, problem solving, memory and safety. Pt will likely need supervision at home initially pending progress in the acute setting but hoping he will progress well with activity once symptoms improve. Will follow acutely to maximize independence and mobility prior to return home.     Recommendations for follow up therapy are one component of a multi-disciplinary discharge planning process, led by the attending physician.  Recommendations may be updated based on patient status, additional functional criteria and insurance authorization.  Follow Up Recommendations Home health PT (pending progress)      Assistance Recommended at Discharge Intermittent Supervision/Assistance  Patient can return home with the following  A  little help with walking and/or transfers;A little help with bathing/dressing/bathroom;Help with stairs or ramp for entrance;Assist for transportation;Assistance with cooking/housework    Equipment Recommendations Other (comment) (TBA, likely nothing)  Recommendations for Other Services       Functional Status Assessment Patient has had a recent decline in their functional status and demonstrates the ability to make significant improvements in function in a reasonable and predictable amount of time.     Precautions / Restrictions Precautions Precautions: Fall;Other (comment) Precaution Comments: keep SBP <160 Restrictions Weight Bearing Restrictions: No      Mobility  Bed Mobility Overal bed mobility: Needs Assistance Bed Mobility: Sit to Supine       Sit to supine: Supervision   General bed mobility comments: Able to get into bed without assist.    Transfers Overall transfer level: Needs assistance Equipment used: None Transfers: Sit to/from Stand Sit to Stand: Min guard, Min assist           General transfer comment: MIn guard-Min A to steady in standing due to painful headache with change in position resulting in posterior bias/LOB with pt grabbing head. Stood from Pensions consultant from Insurance underwriter.    Ambulation/Gait Ambulation/Gait assistance: Min guard Gait Distance (Feet): 120 Feet Assistive device: IV Pole Gait Pattern/deviations: Step-through pattern, Decreased stride length, Drifts right/left Gait velocity: decreased Gait velocity interpretation: <1.8 ft/sec, indicate of risk for recurrent falls   General Gait Details: Slow, mildly unsteady gait with close Min guard for safety. Veers slightly right but does not bump into anything, cues for gaze stabilization due to reports of dizziness with head movement. 1 instance of Min A with sudden onset of nausea/worsened headache and dry heaving needing to sit. Elevated Systolic BP >458, so  rolled pt back to  room.  Stairs            Wheelchair Mobility    Modified Rankin (Stroke Patients Only) Modified Rankin (Stroke Patients Only) Pre-Morbid Rankin Score: No symptoms Modified Rankin: Moderately severe disability     Balance Overall balance assessment: Needs assistance Sitting-balance support: Feet supported, No upper extremity supported Sitting balance-Leahy Scale: Good     Standing balance support: During functional activity, Single extremity supported Standing balance-Leahy Scale: Fair Standing balance comment: Pt able to stand statically with supervsion and no UE support, min guard with moments of Min A for mobility and transfers without an assistive device.                             Pertinent Vitals/Pain Pain Assessment Pain Assessment: 0-10 Pain Score:  (11/10) Pain Location: head esp with changes in position Pain Descriptors / Indicators: Discomfort, Headache, Guarding, Grimacing, Throbbing Pain Intervention(s): Monitored during session, Repositioned, Limited activity within patient's tolerance, Patient requesting pain meds-RN notified    Home Living Family/patient expects to be discharged to:: Private residence Living Arrangements: Alone Available Help at Discharge:  (son lives ~.5 miles away) Type of Home: House Home Access: Stairs to enter Entrance Stairs-Rails: Left;Right;Can reach both Technical brewer of Steps: 3   Home Layout: One level Home Equipment: Conservation officer, nature (2 wheels);Shower seat      Prior Function Prior Level of Function : Independent/Modified Independent             Mobility Comments: Retired, 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, with cars etc ADLs Comments: independent     Hand Dominance   Dominant Hand: Right    Extremity/Trunk Assessment   Upper Extremity Assessment Upper Extremity Assessment: Defer to OT evaluation    Lower Extremity Assessment Lower Extremity Assessment: Generalized weakness (but  functional, no knee buckling with mobility)    Cervical / Trunk Assessment Cervical / Trunk Assessment: Normal  Communication   Communication: No difficulties  Cognition Arousal/Alertness: Awake/alert Behavior During Therapy: WFL for tasks assessed/performed Overall Cognitive Status: Impaired/Different from baseline Area of Impairment: Orientation, Attention, Memory, Following commands, Safety/judgement, Awareness, Problem solving                 Orientation Level: Disoriented to, Situation Current Attention Level: Sustained Memory: Decreased short-term memory Following Commands: Follows one step commands with increased time (repetition at times) Safety/Judgement: Decreased awareness of deficits Awareness: Intellectual Problem Solving: Slow processing, Decreased initiation, Requires verbal cues General Comments: Able to recall 2/3 words with contextual cues for both of them at end of session for STM. Slow processing. Impaired attention. needs repetition to follow more than 1 step commands/cues and increased time. Unsure of baseline cognition?        General Comments General comments (skin integrity, edema, etc.): Pre activity BP 152/78, BP post walk with worsened nausea headache 172/88, BP post activity in supine 153/77. RN made aware of need for pain meds.    Exercises     Assessment/Plan    PT Assessment Patient needs continued PT services  PT Problem List Decreased strength;Decreased mobility;Decreased safety awareness;Decreased activity tolerance;Decreased cognition;Cardiopulmonary status limiting activity;Pain;Decreased balance       PT Treatment Interventions DME instruction;Therapeutic activities;Cognitive remediation;Gait training;Therapeutic exercise;Patient/family education;Balance training;Stair training;Functional mobility training;Neuromuscular re-education    PT Goals (Current goals can be found in the Care Plan section)  Acute Rehab PT Goals Patient  Stated Goal: decrease pain PT Goal Formulation:  With patient Time For Goal Achievement: 02/10/22 Potential to Achieve Goals: Fair    Frequency Min 4X/week     Co-evaluation               AM-PAC PT "6 Clicks" Mobility  Outcome Measure Help needed turning from your back to your side while in a flat bed without using bedrails?: A Little Help needed moving from lying on your back to sitting on the side of a flat bed without using bedrails?: A Little Help needed moving to and from a bed to a chair (including a wheelchair)?: A Little Help needed standing up from a chair using your arms (e.g., wheelchair or bedside chair)?: A Little Help needed to walk in hospital room?: A Little Help needed climbing 3-5 steps with a railing? : A Little 6 Click Score: 18    End of Session Equipment Utilized During Treatment: Gait belt Activity Tolerance: Treatment limited secondary to medical complications (Comment) (nausea, headache, dizziness, elevated BP) Patient left: in bed;with call bell/phone within reach;with bed alarm set;with family/visitor present Nurse Communication: Mobility status;Patient requests pain meds PT Visit Diagnosis: Pain;Unsteadiness on feet (R26.81);Muscle weakness (generalized) (M62.81);Difficulty in walking, not elsewhere classified (R26.2) Pain - part of body:  (head)    Time: 1121-1200 PT Time Calculation (min) (ACUTE ONLY): 39 min   Charges:   PT Evaluation $PT Eval Moderate Complexity: 1 Mod PT Treatments $Gait Training: 8-22 mins $Therapeutic Activity: 8-22 mins        Marisa Severin, PT, DPT Acute Rehabilitation Services Secure chat preferred Office [place] 01/27/2022, 12:44 PM

## 2022-01-27 NOTE — Progress Notes (Signed)
  NEUROSURGERY PROGRESS NOTE   Pt seen and examined. No issues overnight.   EXAM: Temp:  [97.6 F (36.4 C)-98.8 F (37.1 C)] 98.1 F (36.7 C) (01/15 0800) Pulse Rate:  [45-82] 57 (01/15 0900) Resp:  [0-19] 19 (01/15 0900) BP: (114-162)/(40-94) 128/70 (01/15 0900) SpO2:  [87 %-99 %] 93 % (01/15 0900) Arterial Line BP: (115-159)/(44-109) 141/57 (01/14 2345) Weight:  [108.9 kg] 108.9 kg (01/14 1512) Intake/Output      01/14 0701 01/15 0700 01/15 0701 01/16 0700   I.V. (mL/kg) 1847.8 (17) 200 (1.8)   Total Intake(mL/kg) 1847.8 (17) 200 (1.8)   Urine (mL/kg/hr) 1950    Blood 100    Total Output 2050    Net -202.2 +200         Awake, alert, oriented Speech fluent CN intact MAE good strength Right groin site soft  LABS: Lab Results  Component Value Date   CREATININE 0.80 01/26/2022   BUN 19 01/26/2022   NA 143 01/26/2022   K 3.8 01/26/2022   CL 107 01/26/2022   CO2 24 01/26/2022   Lab Results  Component Value Date   WBC 14.1 (H) 01/26/2022   HGB 17.0 01/26/2022   HCT 50.0 01/26/2022   MCV 92.8 01/26/2022   PLT 260 01/26/2022    IMPRESSION: - 67 y.o. male SAH d#1 s/p coil embolization basilar apex aneurysm. Neurologically intact  PLAN: - SBP goal < 117mHg - d/c Foley - Begin to mobilize today - Cont Nimotop - TCD today   NConsuella Lose MD [facility]

## 2022-01-27 NOTE — Progress Notes (Signed)
Transcranial Doppler  Date POD PCO2 HCT BP  MCA ACA PCA OPHT SIPH VERT Basilar  1/15 RH   50 158/ 76 Right  Left   63  64   -33  -46   '29  25   21  27   '$ 92  51   -41  -36   -39           Right  Left                                            Right  Left                                             Right  Left                                             Right  Left                                            Right  Left                                            Right  Left                                        MCA = Middle Cerebral Artery      OPHT = Opthalmic Artery     BASILAR = Basilar Artery   ACA = Anterior Cerebral Artery     SIPH = Carotid Siphon PCA = Posterior Cerebral Artery   VERT = Verterbral Artery                   Normal MCA = 62+\-12 ACA = 50+\-12 PCA = 42+\-23   RT [place]: 2.42 LT [place]: 1.83  Darlin Coco, RDMS, RVT

## 2022-01-28 MED ORDER — HYDRALAZINE HCL 20 MG/ML IJ SOLN
10.0000 mg | INTRAMUSCULAR | Status: DC | PRN
  Administered 2022-01-28 – 2022-01-29 (×3): 10 mg via INTRAVENOUS
  Filled 2022-01-28 (×3): qty 1

## 2022-01-28 MED ORDER — LORAZEPAM 0.5 MG PO TABS
0.5000 mg | ORAL_TABLET | Freq: Three times a day (TID) | ORAL | Status: DC | PRN
  Administered 2022-01-28 – 2022-02-07 (×6): 0.5 mg via ORAL
  Filled 2022-01-28 (×6): qty 1

## 2022-01-28 NOTE — Anesthesia Postprocedure Evaluation (Signed)
Anesthesia Post Note  Patient: Joe [name]  Procedure(s) Performed: IR WITH ANESTHESIA     Patient location during evaluation: PACU Anesthesia Type: General Level of consciousness: awake and alert Pain management: pain level controlled Vital Signs Assessment: post-procedure vital signs reviewed and stable Respiratory status: spontaneous breathing, nonlabored ventilation, respiratory function stable and patient connected to nasal cannula oxygen Cardiovascular status: blood pressure returned to baseline and stable Postop Assessment: no apparent nausea or vomiting Anesthetic complications: no   No notable events documented.  Last Vitals:  Vitals:   01/28/22 1100 01/28/22 1200  BP: (!) 160/80 (!) 150/98  Pulse: (!) 49 61  Resp: 16 19  Temp:  36.7 C  SpO2: 96% 95%    Last Pain:  Vitals:   01/28/22 1200  TempSrc: Oral  PainSc: [place]

## 2022-01-28 NOTE — Progress Notes (Signed)
PT Cancellation Note  Patient Details Name: Joe [name] MRN: [id number] DOB: 1955/03/27   Cancelled Treatment:    Reason Eval/Treat Not Completed: Patient declined, no reason specified; just back to bed after walking hallway with nursing this am and sitting up in recliner.  Wants to rest and PT to return, but PT unable.  Encouraged to walk with nursing later today.  PT will continue attempts.    Reginia Naas 01/28/2022, 3:23 PM Magda Kiel, PT Acute Rehabilitation Services [phone] 01/28/2022

## 2022-01-28 NOTE — Progress Notes (Signed)
Pt complaining of anxiety. Pt's SBP is also >160. Paged Neurosurgery MD. New orders given.

## 2022-01-28 NOTE — Progress Notes (Signed)
  NEUROSURGERY PROGRESS NOTE   Pt seen and examined. No issues overnight. Somewhat more HA this am, also c/o blurry vision  EXAM: Temp:  [98.4 F (36.9 C)-98.6 F (37 C)] 98.4 F (36.9 C) (01/16 0400) Pulse Rate:  [46-67] 57 (01/16 0700) Resp:  [5-19] 14 (01/16 0700) BP: (127-163)/(63-100) 145/86 (01/16 0700) SpO2:  [87 %-97 %] 95 % (01/16 0700) Intake/Output      01/15 0701 01/16 0700 01/16 0701 01/17 0700   I.V. (mL/kg) 2173.8 (20)    Total Intake(mL/kg) 2173.8 (20)    Urine (mL/kg/hr) 500 (0.2)    Emesis/NG output 0    Blood     Total Output 500    Net +1673.8         Urine Occurrence 1 x    Emesis Occurrence 1 x     Awake, alert, oriented Speech fluent CN intact, EOMI MAE good strength Right groin site soft  LABS: Lab Results  Component Value Date   CREATININE 0.80 01/26/2022   BUN 19 01/26/2022   NA 143 01/26/2022   K 3.8 01/26/2022   CL 107 01/26/2022   CO2 24 01/26/2022   Lab Results  Component Value Date   WBC 14.1 (H) 01/26/2022   HGB 17.0 01/26/2022   HCT 50.0 01/26/2022   MCV 92.8 01/26/2022   PLT 260 01/26/2022   TCD: Date POD PCO2 HCT BP   MCA ACA PCA OPHT SIPH VERT Basilar  1/15 RH     50 158/ 76 Right  Left   63  64   -33  -46   '29  25   21  27   '$ 38  51   -41  -63   -84        IMPRESSION: - 67 y.o. male SAH d#2 s/p coil embolization basilar apex aneurysm. Neurologically intact  PLAN: - Cont to mobilize  - Cont Nimotop    Consuella Lose, MD [facility]

## 2022-01-29 ENCOUNTER — Inpatient Hospital Stay (HOSPITAL_COMMUNITY): Payer: Medicare HMO

## 2022-01-29 DIAGNOSIS — I608 Other nontraumatic subarachnoid hemorrhage: Secondary | ICD-10-CM

## 2022-01-29 MED ORDER — ONDANSETRON HCL 4 MG PO TABS
4.0000 mg | ORAL_TABLET | Freq: Three times a day (TID) | ORAL | Status: DC
  Administered 2022-01-29 – 2022-02-07 (×19): 4 mg via ORAL
  Filled 2022-01-29 (×19): qty 1

## 2022-01-29 MED ORDER — HYDRALAZINE HCL 20 MG/ML IJ SOLN
10.0000 mg | INTRAMUSCULAR | Status: DC | PRN
  Administered 2022-01-30 – 2022-02-05 (×10): 10 mg via INTRAVENOUS
  Filled 2022-01-29 (×12): qty 1

## 2022-01-29 NOTE — Progress Notes (Signed)
Occupational Therapy Treatment Patient Details Name: Joe [name] MRN: [id number] DOB: 1955/09/29 Today's Date: 01/29/2022   History of present illness Patient is a 67 y/o male who presents on 1/14 with sudden onset of weakness, N/V, HA, diaphoresis. 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 PMH includes CHF, VF, OSA, AICD.   OT comments  Pt making steady progress with OT this am.  Supervision for transfers in the room and for functional mobility in the hallway without an assistive device.  Completed selfcare sit to stand and grooming tasks in standing at the sink with supervision as well.  BP elevated at 156/72 in sitting and 182/92 and 184/96 in standing.  It came down to 160/78 with return to the bed.  Nursing made aware.  Pt with slight blurry vision and intermittent diplopia with far vision in the right lateral field.  Overall feel he is progressing well and goals to be upgraded to modified independent level for home.     Recommendations for follow up therapy are one component of a multi-disciplinary discharge planning process, led by the attending physician.  Recommendations may be updated based on patient status, additional functional criteria and insurance authorization.    Follow Up Recommendations  Home health OT     Assistance Recommended at Discharge Intermittent Supervision/Assistance  Patient can return home with the following  Assistance with cooking/housework;Assist for transportation   Equipment Recommendations  None recommended by OT       Precautions / Restrictions Precautions Precautions: Fall Precaution Comments: intermittent diplopia in far right visual field, monitor BP notify nursing/physician of systolic greater than 332 Restrictions Weight Bearing Restrictions: No       Mobility Bed Mobility   Bed Mobility: Sit to Supine       Sit to supine: Supervision        Transfers Overall transfer  level: Needs assistance Equipment used: None Transfers: Sit to/from Stand Sit to Stand: Supervision     Step pivot transfers: Supervision     General transfer comment: Pt able to mobilize around the hallway without assistive device and with close supervision while incorporating head turns.     Balance Overall balance assessment: Needs assistance Sitting-balance support: Feet supported, No upper extremity supported Sitting balance-Leahy Scale: Good     Standing balance support: No upper extremity supported Standing balance-Leahy Scale: Fair Standing balance comment: No LOB noted with functional mobility today                           ADL either performed or assessed with clinical judgement   ADL Overall ADL's : Needs assistance/impaired     Grooming: Wash/dry hands;Wash/dry face;Supervision/safety;Applying deodorant;Oral care;Sitting;Standing Grooming Details (indicate cue type and reason): stood at the sink for application of deodorant and for oral hygiene Upper Body Bathing: Supervision/ safety;Standing   Lower Body Bathing: Supervison/ safety;Sit to/from stand   Upper Body Dressing : Minimal assistance;Standing Upper Body Dressing Details (indicate cue type and reason): hospital gown Lower Body Dressing: Sit to/from stand;Supervision/safety   Toilet Transfer: Supervision/safety;Ambulation Toilet Transfer Details (indicate cue type and reason): simulated without device Toileting- Clothing Manipulation and Hygiene: Sit to/from stand;Supervision/safety Toileting - Clothing Manipulation Details (indicate cue type and reason): simulated     Functional mobility during ADLs: Supervision/safety (no device) General ADL Comments: Pt reporting some slight blurry vision and diplopia noted with eye gaze to the right when looking at items distally.  Not  present in any other field.  BP elevated in sitting and standing.  In sitting BP 156/72.  With standing and post mobility  BP 182/92 and 184/96.  Once back in the room sitting it decreased down to 161/100 and to 160/78 in supine.      Cognition Arousal/Alertness: Awake/alert Behavior During Therapy: WFL for tasks assessed/performed Overall Cognitive Status: Within Functional Limits for tasks assessed                     Current Attention Level: Sustained, Selective   Following Commands: Follows multi-step commands with increased time, Follows one step commands consistently       General Comments: Cognition much improved this session.  Pt still with some impulsivity wanting to get up and move before therapist had lines ready.  Oriented to day of the week and place                   Pertinent Vitals/ Pain       Pain Assessment Pain Assessment: Faces Faces Pain Scale: Hurts a little bit Pain Location: upper shoulders Pain Descriptors / Indicators: Discomfort, Headache Pain Intervention(s): Limited activity within patient's tolerance, Repositioned         Frequency  Min 2X/week        Progress Toward Goals  OT Goals(current goals can now be found in the care plan section)  Progress towards OT goals: Progressing toward goals;Goals met and updated - see care plan  Acute Rehab OT Goals Patient Stated Goal: Pt wants to get washed up OT Goal Formulation: With patient Time For Goal Achievement: 02/12/22 Potential to Achieve Goals: Good  Plan Discharge plan remains appropriate       AM-PAC OT "6 Clicks" Daily Activity     Outcome Measure   Help from another person eating meals?: None Help from another person taking care of personal grooming?: A Little Help from another person toileting, which includes using toliet, bedpan, or urinal?: A Little Help from another person bathing (including washing, rinsing, drying)?: A Little Help from another person to put on and taking off regular upper body clothing?: None Help from another person to put on and taking off regular lower body  clothing?: A Little 6 Click Score: 20    End of Session Equipment Utilized During Treatment: Gait belt  OT Visit Diagnosis: Unsteadiness on feet (R26.81);Pain;Muscle weakness (generalized) (M62.81);Other abnormalities of gait and mobility (R26.89) Pain - part of body:  (headache)   Activity Tolerance Patient tolerated treatment well   Patient Left in chair;with call bell/phone within reach;Other (comment)   Nurse Communication Mobility status        Time: 3086-5784 OT Time Calculation (min): 50 min  Charges: OT General Charges $OT Visit: 1 Visit OT Treatments $Self Care/Home Management : 38-52 mins  Eythan Jayne OTR/L 01/29/2022, 12:36 PM

## 2022-01-29 NOTE — Progress Notes (Signed)
  NEUROSURGERY PROGRESS NOTE   Pt seen and examined. No issues overnight. Somewhat more HA this am, also c/o blurry vision  EXAM: Temp:  [98 F (36.7 C)-98.5 F (36.9 C)] 98.4 F (36.9 C) (01/17 1600) Pulse Rate:  [50-61] 61 (01/17 1600) Resp:  [5-19] 16 (01/17 1600) BP: (124-176)/(53-114) 124/110 (01/17 1600) SpO2:  [90 %-98 %] 92 % (01/17 1600) Intake/Output      01/16 0701 01/17 0700 01/17 0701 01/18 0700   P.O. 480 600   I.V. (mL/kg) 349 (3.2)    Total Intake(mL/kg) 829 (7.6) 600 (5.5)   Urine (mL/kg/hr)     Emesis/NG output     Total Output     Net +829 +600        Urine Occurrence 6 x 2 x   Emesis Occurrence 2 x     Awake, alert, oriented Speech fluent CN intact, EOMI MAE good strength Right groin site soft  LABS: Lab Results  Component Value Date   CREATININE 0.80 01/26/2022   BUN 19 01/26/2022   NA 143 01/26/2022   K 3.8 01/26/2022   CL 107 01/26/2022   CO2 24 01/26/2022   Lab Results  Component Value Date   WBC 14.1 (H) 01/26/2022   HGB 17.0 01/26/2022   HCT 50.0 01/26/2022   MCV 92.8 01/26/2022   PLT 260 01/26/2022   TCD: Date POD PCO2 HCT BP   MCA ACA PCA OPHT SIPH VERT Basilar  1/15 RH     50 158/ 76 Right  Left   63  64   -33  -46   '29  25   21  27   '$ 17  51   -70  -60   -63        IMPRESSION: - 67 y.o. male SAH d#3 s/p coil embolization basilar apex aneurysm. Neurologically intact   PLAN: - Cont to mobilize  - Cont Nimotop - TCD today - Will add zofran around the clock    Consuella Lose, MD [facility]

## 2022-01-29 NOTE — Progress Notes (Signed)
Physical Therapy Treatment Patient Details Name: Joe [name] MRN: [id number] DOB: Jun 09, 1955 Today's Date: 01/29/2022   History of Present Illness Patient is a 67 y/o male who presents on 1/14 with sudden onset of weakness, N/V, HA, diaphoresis. 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 PMH includes CHF, VF, OSA, AICD.    PT Comments    Pt has made good progress toward goals, though today doesn't feel well.  Emphasis on safe transitions, sit to stands, standing balance and progression of gait.  Plan to complete DGI deferred by pt due to not felling well.    Recommendations for follow up therapy are one component of a multi-disciplinary discharge planning process, led by the attending physician.  Recommendations may be updated based on patient status, additional functional criteria and insurance authorization.  Follow Up Recommendations  Home health PT (pending progress)     Assistance Recommended at Discharge Intermittent Supervision/Assistance  Patient can return home with the following A little help with bathing/dressing/bathroom;Assistance with cooking/housework;Assist for transportation;Help with stairs or ramp for entrance   Equipment Recommendations  Other (comment);None recommended by PT (TBD)    Recommendations for Other Services       Precautions / Restrictions Precautions Precautions: Fall Precaution Comments: intermittent diplopia in far right visual field, monitor BP notify nursing/physician of systolic greater than 627 Restrictions Weight Bearing Restrictions: No     Mobility  Bed Mobility Overal bed mobility: Needs Assistance       Supine to sit: Supervision Sit to supine: Supervision   General bed mobility comments: Able to get into bed without assist.    Transfers Overall transfer level: Needs assistance Equipment used: None Transfers: Sit to/from Stand Sit to Stand: Supervision   Step  pivot transfers: Supervision            Ambulation/Gait Ambulation/Gait assistance: Supervision Gait Distance (Feet): 15 Feet (to bathroom, 500 feet in the halls) Assistive device: None Gait Pattern/deviations: Step-through pattern, Decreased stride length, Drifts right/left   Gait velocity interpretation: 1.31 - 2.62 ft/sec, indicative of limited community ambulator   General Gait Details: slower, generally steady, but not able to attain age appropriate speeds   Marine scientist Rankin (Stroke Patients Only) Modified Rankin (Stroke Patients Only) Modified Rankin: Moderate disability     Balance Overall balance assessment: Needs assistance   Sitting balance-Leahy Scale: Good       Standing balance-Leahy Scale: Fair (to good)                              Cognition Arousal/Alertness: Awake/alert Behavior During Therapy: WFL for tasks assessed/performed Overall Cognitive Status: Within Functional Limits for tasks assessed                                          Exercises      General Comments General comments (skin integrity, edema, etc.): attempted DGI, but pt not feeling well and not wanting to challenge himself      Pertinent Vitals/Pain Pain Assessment Pain Assessment: Faces Faces Pain Scale: Hurts a little bit Pain Location: head Pain Descriptors / Indicators: Aching Pain Intervention(s): Monitored during session    Home Living  Prior Function            PT Goals (current goals can now be found in the care plan section) Acute Rehab PT Goals Patient Stated Goal: decrease pain PT Goal Formulation: With patient Time For Goal Achievement: 02/10/22 Potential to Achieve Goals: Good Progress towards PT goals: Progressing toward goals    Frequency    Min 4X/week      PT Plan Current plan remains appropriate    Co-evaluation               AM-PAC PT "6 Clicks" Mobility   Outcome Measure  Help needed turning from your back to your side while in a flat bed without using bedrails?: A Little Help needed moving from lying on your back to sitting on the side of a flat bed without using bedrails?: A Little Help needed moving to and from a bed to a chair (including a wheelchair)?: A Little Help needed standing up from a chair using your arms (e.g., wheelchair or bedside chair)?: A Little Help needed to walk in hospital room?: A Little Help needed climbing 3-5 steps with a railing? : A Little 6 Click Score: 18    End of Session   Activity Tolerance: Patient tolerated treatment well Patient left: in bed;with call bell/phone within reach;with bed alarm set;with family/visitor present Nurse Communication: Mobility status PT Visit Diagnosis: Pain;Unsteadiness on feet (R26.81) Pain - part of body:  (head)     Time: 2694-8546 PT Time Calculation (min) (ACUTE ONLY): 14 min  Charges:  $Gait Training: 8-22 mins                     01/29/2022  Ginger Carne., PT Acute Rehabilitation Services [phone]  (office)   Tessie Fass Nayely Dingus 01/29/2022, 4:24 PM

## 2022-01-29 NOTE — Progress Notes (Signed)
Transcranial Doppler  Date POD PCO2 HCT BP  MCA ACA PCA OPHT SIPH VERT Basilar  1/15 RH   50 158/ 76 Right  Left   63  64   -33  -46   '29  25   21  27   '$ 92  51   -41  -36   -39      1/17 GC     Right  Left   52  64   -51  -28   30  -'20   19  23   27  24   '$ -32  -30   -45           Right  Left                                             Right  Left                                             Right  Left                                            Right  Left                                            Right  Left                                        MCA = Middle Cerebral Artery      OPHT = Opthalmic Artery     BASILAR = Basilar Artery   ACA = Anterior Cerebral Artery     SIPH = Carotid Siphon PCA = Posterior Cerebral Artery   VERT = Verterbral Artery                   Normal MCA = 62+\-12 ACA = 50+\-12 PCA = 42+\-23   RT [place]: 2.08 LT [place]: 3.05  01/29/22 2:08 PM Joe [name] RVT

## 2022-01-30 MED ORDER — PROCHLORPERAZINE EDISYLATE 10 MG/2ML IJ SOLN
5.0000 mg | INTRAMUSCULAR | Status: DC | PRN
  Administered 2022-01-30 – 2022-02-02 (×7): 5 mg via INTRAVENOUS
  Filled 2022-01-30 (×7): qty 2

## 2022-01-30 NOTE — Progress Notes (Signed)
Speech Language Pathology Treatment: Cognitive-Linquistic  Patient Details Name: Joe [name] MRN: [id number] DOB: November 22, 1955 Today's Date: 01/30/2022 Time: 3382-5053 SLP Time Calculation (min) (ACUTE ONLY): 20 min  Assessment / Plan / Recommendation Clinical Impression  Pt was seen cognitive-linguistic treatment. He was alert and cooperative during the session and reported that he believes his cognition is improved. Pt's processing speed was notably improved compared to when he was last seen. He required reduced prompts for sustained attention. He completed a medication management (prescription) task for 80% accuracy increasing to 100% with min prompts. Pt demonstrated recall of concrete information from recorded voice mails with 40% accuracy increasing to 60% with cues. Pt was educated on use of internal memory aids. Following education and his reported used of rehearsal, pt's accuracy improved to 60% with the same task increasing to 100% with prompts. SLP will continue to follow pt.     HPI HPI: Pt is a 67 y.o. male who presented with sudden onset of headache. CT head: large volume of acute subarachnoid hemorrhage in an aneurysmal rupture pattern. 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 PMH: CHF, heart attack, OSA, dyslipidemia.      SLP Plan  Continue with current plan of care      Recommendations for follow up therapy are one component of a multi-disciplinary discharge planning process, led by the attending physician.  Recommendations may be updated based on patient status, additional functional criteria and insurance authorization.    Recommendations                   Oral Care Recommendations: Oral care BID Follow Up Recommendations: Home health SLP Assistance recommended at discharge: Intermittent Supervision/Assistance SLP Visit Diagnosis: Cognitive communication deficit (R41.841);Dysarthria and anarthria (R47.1) Plan: Continue with current plan of  care         Kitrina Maurin I. Hardin Negus, [place], [place] Office number [phone]  [name]  01/30/2022, 12:49 PM

## 2022-01-30 NOTE — Progress Notes (Signed)
PT Cancellation Note  Patient Details Name: MATEJ SAPPENFIELD MRN: [id number] DOB: Sep 29, 1955   Cancelled Treatment:    Reason Eval/Treat Not Completed: Patient declined, no reason specified.  Pt and RN agreed pt has been sore and nauseated today.  Pt doesn't want to move. 01/30/2022  Ginger Carne., PT Acute Rehabilitation Services [phone]  (office)   Tessie Fass [place] 01/30/2022, 4:36 PM

## 2022-01-30 NOTE — Progress Notes (Signed)
Pt having ongoing issue with nausea.  Q6 PRN IV zofran given 2.5 hrs ago.  Dr. Reatha Armour paged in regards.  No new neurologic findings.  PRN order given, check MAR

## 2022-01-30 NOTE — Progress Notes (Signed)
  NEUROSURGERY PROGRESS NOTE   Pt seen and examined. No issues overnight. HA and neck pain persists  EXAM: Temp:  [98 F (36.7 C)-98.5 F (36.9 C)] 98.4 F (36.9 C) (01/17 1600) Pulse Rate:  [50-61] 61 (01/17 1600) Resp:  [5-19] 16 (01/17 1600) BP: (124-176)/(53-114) 124/110 (01/17 1600) SpO2:  [90 %-98 %] 92 % (01/17 1600) Intake/Output      01/16 0701 01/17 0700 01/17 0701 01/18 0700   P.O. 480 600   I.V. (mL/kg) 349 (3.2)    Total Intake(mL/kg) 829 (7.6) 600 (5.5)   Urine (mL/kg/hr)     Emesis/NG output     Total Output     Net +829 +600        Urine Occurrence 6 x 2 x   Emesis Occurrence 2 x     Awake, alert, oriented Speech fluent CN intact, EOMI MAE good strength   LABS: Lab Results  Component Value Date   CREATININE 0.80 01/26/2022   BUN 19 01/26/2022   NA 143 01/26/2022   K 3.8 01/26/2022   CL 107 01/26/2022   CO2 24 01/26/2022   Lab Results  Component Value Date   WBC 14.1 (H) 01/26/2022   HGB 17.0 01/26/2022   HCT 50.0 01/26/2022   MCV 92.8 01/26/2022   PLT 260 01/26/2022   TCD: Date POD PCO2 HCT BP   MCA ACA PCA OPHT SIPH VERT Basilar  1/15 RH     50 158/ 76 Right  Left   63  64   -33  -46   '29  25   21  27   '$ 92  51   -41  -36   -39       1/17 GC         Right  Left   52  64   -51  -28   30  -'20   19  23   27  24   '$ -11  -30   -45        IMPRESSION: - 67 y.o. male SAH d#4 s/p coil embolization basilar apex aneurysm. Neurologically intact   PLAN: - Cont to mobilize  - Cont Nimotop - Monitor exam for spasm    Consuella Lose, MD [facility]

## 2022-01-31 ENCOUNTER — Inpatient Hospital Stay (HOSPITAL_COMMUNITY): Payer: Medicare HMO

## 2022-01-31 DIAGNOSIS — I608 Other nontraumatic subarachnoid hemorrhage: Secondary | ICD-10-CM | POA: Diagnosis not present

## 2022-01-31 NOTE — Evaluation (Signed)
Clinical/Bedside Swallow Evaluation Patient Details  Name: Joe [name] MRN: [id number] Date of Birth: 1955/08/28  Today's Date: 01/31/2022 Time: SLP Start Time (ACUTE ONLY): 4174 SLP Stop Time (ACUTE ONLY): 0814 SLP Time Calculation (min) (ACUTE ONLY): 13 min  Past Medical History:  Past Medical History:  Diagnosis Date   Chronic systolic CHF (congestive heart failure), NYHA class 1 (HCC)    EF 37% by nuclear stress test   Coronary artery disease    s/p PCI mild LAD (BMS) in setting of AWMI complicated by Vfib arrest with 40-50% residual disesae in left circ   Hyperlipidemia    Ischemic cardiomyopathy    A. 01/09/2011 - s/p St[employer] Jude Fort[employer] [place] AICD   OSA (obstructive sleep apnea)    severe with AHI 26/hr intolerant to CPAP   VF (ventricular fibrillation) ([place])    arrest in setting of AMI 6/11   Past Surgical History:  Past Surgical History:  Procedure Laterality Date   BACK SURGERY     BACK SURGERY     IMPLANTABLE CARDIOVERTER DEFIBRILLATOR IMPLANT N/A 01/09/2011   Procedure: IMPLANTABLE CARDIOVERTER DEFIBRILLATOR IMPLANT;  Surgeon: Evans Lance, MD;  Location[facility] C[facility] LAB;  Service: Cardiovascular;  Laterality: N/A;   IR ANGIO INTRA EXTRACRAN SEL INTERNAL CAROTID BILAT MOD SED  01/26/2022   IR ANGIO VERTEBRAL SEL VERTEBRAL UNI R MOD SED  01/26/2022   IR ANGIOGRAM FOLLOW UP STUDY  01/26/2022   IR NEURO EACH ADD'L AFTER BASIC UNI LEFT (MS)  01/26/2022   IR TRANSCATH/EMBOLIZ  01/26/2022   KNEE ARTHROSCOPY     right   PACEMAKER INSERTION  2012   St Jude   RADIOLOGY WITH ANESTHESIA N/A 01/26/2022   Procedure: IR WITH ANESTHESIA;  Surgeon: Consuella Lose, MD;  Location: [place];  Service: Radiology;  Laterality: N/A;   HPI:  Pt is a 67 y.o. male who presented with sudden onset of headache. CT head: large volume of acute subarachnoid hemorrhage in an aneurysmal rupture pattern. 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 PMH: CHF, heart attack, OSA,  dyslipidemia.    Assessment / Plan / Recommendation  Clinical Impression  Pt was seen for bedside swallow evaluation and he denied a history of dysphagia. Oral mechanism exam was [facility]; dentition was adequate and natural. He exhibited intermittent subtle coughing with thin liquids as was reported to nursing yesterday, but his oropharyngeal swallow mechanism otherwise appeared [facility]. A modified barium swallow study will be conducted to further assess physiology and it is scheduled for this morning. SLP Visit Diagnosis: Dysphagia, unspecified (R13.10)    Aspiration Risk       Diet Recommendation Regular;Thin liquid   Liquid Administration via: Cup;Straw Medication Administration: Whole meds with liquid Compensations: Slow rate;Small sips/bites (avoid consecutive swallows) Postural Changes: Seated upright at 90 degrees    Other  Recommendations Oral Care Recommendations: Oral care BID    Recommendations for follow up therapy are one component of a multi-disciplinary discharge planning process, led by the attending physician.  Recommendations may be updated based on patient status, additional functional criteria and insurance authorization.  Follow up Recommendations Home health SLP      Assistance Recommended at Discharge    Functional Status Assessment Patient has had a recent decline in their functional status and demonstrates the ability to make significant improvements in function in a reasonable and predictable amount of time.  Frequency and Duration min 2x/week  2 weeks       Prognosis Prognosis for Safe  Diet Advancement: Good Barriers to Reach Goals: Cognitive deficits      Swallow Study   General Date of Onset: 01/30/22 HPI: Pt is a 67 y.o. male who presented with sudden onset of headache. CT head: large volume of acute subarachnoid hemorrhage in an aneurysmal rupture pattern. 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 PMH: CHF, heart attack, OSA, dyslipidemia. Type  of Study: Bedside Swallow Evaluation Previous Swallow Assessment: none Diet Prior to this Study: Regular;Thin liquids Temperature Spikes Noted: No Respiratory Status: Room air History of Recent Intubation: No Behavior/Cognition: Alert;Cooperative;Pleasant mood Oral Cavity Assessment: Within Functional Limits Oral Care Completed by SLP: No Oral Cavity - Dentition: Adequate natural dentition Vision: Functional for self-feeding Self-Feeding Abilities: Able to feed self Patient Positioning: Upright in bed;Upright in chair;Postural control adequate for testing Baseline Vocal Quality: Normal Volitional Cough: Strong Volitional Swallow: Able to elicit    Oral/Motor/Sensory Function Overall Oral Motor/Sensory Function: Within functional limits   Ice Chips Ice chips: Within functional limits Presentation: Spoon   Thin Liquid Thin Liquid: Impaired Presentation: Straw Pharyngeal  Phase Impairments: Cough - Immediate;Cough - Delayed    Nectar Thick Nectar Thick Liquid: Not tested   Honey Thick Honey Thick Liquid: Not tested   Puree Puree: Within functional limits Presentation: Spoon   Solid     Solid: Within functional limits Presentation: [place] I. Hardin Negus, [place], [place] Office number [phone]  [name] 01/31/2022,8:47 AM

## 2022-01-31 NOTE — Progress Notes (Signed)
  Transition of Care [facility]) Screening Note   Patient Details  Name: Joe [name] Date of Birth: October 17, 1955   Transition of Care [facility] CM/SW Contact:    [name], LCSW Phone Number: 01/31/2022, 4:22 PM    Transition of Care Department [facility]) has reviewed patient. We will continue to monitor patient advancement through interdisciplinary progression rounds. If new patient transition needs arise, please place a TOC consult.

## 2022-01-31 NOTE — Progress Notes (Signed)
Modified Barium Swallow Progress Note  Patient Details  Name: Joe [name] MRN: [id number] Date of Birth: Jun 08, 1955  Today's Date: 01/31/2022  Modified Barium Swallow completed.  Full report located under Chart Review in the Imaging Section.  Brief recommendations include the following:  Clinical Impression  Pt was seen in radiology suite for modified barium swallow study. Trials of puree solids, regular texture solids, a 61m barium tablet, and thin liquids via cup and straw were administered. Pt's oropharyngeal swallow mechanism was within functional limits. Coughing was occasionally noted during the study as was demonstrated at bedside; however, no instances of penetration or aspiration were observed despite challenges of large boluses and consecutive swallows. Trace esophageal stasis was noted following the barium tablet, but no difficulty was noted with esophageal transport of the tablet. It is recommended that a regular texture diet with thin liquids be continued at this time. Further skilled SLP services are clinically indicated for swallowing, but will be continued for cognition.   Swallow Evaluation Recommendations       SLP Diet Recommendations: Regular solids;Thin liquid   Liquid Administration via: Cup;Straw   Medication Administration: Whole meds with liquid   Supervision: Patient able to self feed   Compensations: Slow rate;Small sips/bites (avoid consecutive swallows)   Postural Changes: Seated upright at 90 degrees   Oral Care Recommendations: Oral care BID      Tijuan Dantes I. PHardin Negus MSebring CPaxtonOffice number [phone]  SHorton Marshall1/19/2024,10:05 AM

## 2022-01-31 NOTE — Progress Notes (Signed)
PT Cancellation Note  Patient Details Name: Joe [name] MRN: [id number] DOB: 03-09-1955   Cancelled Treatment:    Reason Eval/Treat Not Completed: Patient declined, no reason specified.  Pt relays again not feeling well and declining. 01/31/2022  Ginger Carne., PT Acute Rehabilitation Services [phone]  (office)   Tessie Fass Chantille Navarrete 01/31/2022, 5:07 PM

## 2022-01-31 NOTE — Progress Notes (Signed)
Transcranial Doppler  Date POD PCO2 HCT BP  MCA ACA PCA OPHT SIPH VERT Basilar  1/15 RH   50 158/ 76 Right  Left   63  64   -33  -46   '29  25   21  27   '$ 92  51   -41  -36   -39      1/17 GC     Right  Left   52  64   -51  -28   30  -'20   19  23   27  24   '$ -32  -30   -45      1/17JH    171/75 Right  Left   51  53   -24  -41   -32  '18   14  20    '$ *  20   -23  -28   -34            Right  Left                                             Right  Left                                            Right  Left                                            Right  Left                                        MCA = Middle Cerebral Artery      OPHT = Opthalmic Artery     BASILAR = Basilar Artery   ACA = Anterior Cerebral Artery     SIPH = Carotid Siphon PCA = Posterior Cerebral Artery   VERT = Verterbral Artery                   Normal MCA = 62+\-12 ACA = 50+\-12 PCA = 42+\-23   RT [place]:1.64LT [place]: 2.30  Results can be found under chart review under CV PROC. 01/31/2022 5:35 PM Jaymi Tinner RVT, RDMS

## 2022-01-31 NOTE — Progress Notes (Signed)
  NEUROSURGERY PROGRESS NOTE   Pt seen and examined. No issues overnight. HA and neck pain persists but improved today  EXAM: Temp:  [97.9 F (36.6 C)-99.4 F (37.4 C)] 99.4 F (37.4 C) (01/19 0800) Pulse Rate:  [49-70] 70 (01/19 1100) Resp:  [0-21] 12 (01/19 1100) BP: (151-200)/(57-94) 177/93 (01/19 1100) SpO2:  [87 %-96 %] 93 % (01/19 1100) Intake/Output      01/18 0701 01/19 0700 01/19 0701 01/20 0700   P.O.  480   Total Intake(mL/kg)  480 (4.4)   Urine (mL/kg/hr)     Emesis/NG output     Total Output     Net  +480        Urine Occurrence 4 x 1 x   Emesis Occurrence 3 x     Awake, alert, oriented Speech fluent CN intact, EOMI MAE good strength   LABS: Lab Results  Component Value Date   CREATININE 0.80 01/26/2022   BUN 19 01/26/2022   NA 143 01/26/2022   K 3.8 01/26/2022   CL 107 01/26/2022   CO2 24 01/26/2022   Lab Results  Component Value Date   WBC 14.1 (H) 01/26/2022   HGB 17.0 01/26/2022   HCT 50.0 01/26/2022   MCV 92.8 01/26/2022   PLT 260 01/26/2022   TCD: Date POD PCO2 HCT BP   MCA ACA PCA OPHT SIPH VERT Basilar  1/15 RH     50 158/ 76 Right  Left   63  64   -33  -46   '29  25   21  27   '$ 92  51   -41  -36   -39       1/17 GC         Right  Left   52  64   -51  -28   30  -'20   19  23   27  24   '$ -19  -30   -92        IMPRESSION: - 67 y.o. 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 Neurologically intact   PLAN: - Cont to mobilize  - Cont Nimotop - TCD today - Monitor exam for spasm    Consuella Lose, MD [facility]

## 2022-02-01 MED ORDER — SALINE SPRAY 0.65 % NA SOLN
1.0000 | NASAL | Status: DC | PRN
  Filled 2022-02-01: qty 44

## 2022-02-01 NOTE — Progress Notes (Signed)
Physical Therapy Treatment Patient Details Name: Joe [name] MRN: [id number] DOB: 1955/05/12 Today's Date: 02/01/2022   History of Present Illness Patient is a 67 y/o male who presents on 1/14 with sudden onset of weakness, N/V, HA, diaphoresis. 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 PMH includes CHF, VF, OSA, AICD.    PT Comments    Pt required supervision bed mobility, supervision transfers, and min guard assist ambulation 700' without AD. Mildly unsteady gait, drifting R/L. Pt with c/o blurry vision bilat eyes, and pain/pressure behind his eyes. BP 175/75 prior to mobility and 186/90 upon return to room. Pt assisted in bathroom to void prior to return to supine in bed. Continue to recommend HHPT due to unsteady gait. If pt continues to improve and transportation is available, transition to OPPT would be appropriate. Unable to assess higher level balance this session due to pt's pain level and elevated BP.   Recommendations for follow up therapy are one component of a multi-disciplinary discharge planning process, led by the attending physician.  Recommendations may be updated based on patient status, additional functional criteria and insurance authorization.  Follow Up Recommendations  Home health PT (pending progress)     Assistance Recommended at Discharge Intermittent Supervision/Assistance  Patient can return home with the following A little help with bathing/dressing/bathroom;Assistance with cooking/housework;Assist for transportation;Help with stairs or ramp for entrance   Equipment Recommendations  None recommended by PT    Recommendations for Other Services       Precautions / Restrictions Precautions Precautions: Fall Precaution Comments: intermittent diplopia in far right visual field, monitor BP notify nursing/physician of systolic greater than 976     Mobility  Bed Mobility Overal bed mobility: Needs  Assistance Bed Mobility: Supine to Sit, Sit to Supine     Supine to sit: Supervision Sit to supine: Supervision   General bed mobility comments: supervision for safety    Transfers Overall transfer level: Needs assistance Equipment used: None Transfers: Sit to/from Stand Sit to Stand: Supervision           General transfer comment: supervision for safety, no physical assist    Ambulation/Gait Ambulation/Gait assistance: Min guard Gait Distance (Feet): 700 Feet Assistive device: None Gait Pattern/deviations: Step-through pattern, Decreased stride length, Drifts right/left Gait velocity: decreased Gait velocity interpretation: 1.31 - 2.62 ft/sec, indicative of limited community ambulator   General Gait Details: mildly unsteady, no overt LOB   Stairs             Wheelchair Mobility    Modified Rankin (Stroke Patients Only)       Balance Overall balance assessment: Needs assistance Sitting-balance support: Feet supported, No upper extremity supported Sitting balance-Leahy Scale: Good     Standing balance support: No upper extremity supported, During functional activity Standing balance-Leahy Scale: Fair                              Cognition Arousal/Alertness: Awake/alert Behavior During Therapy: WFL for tasks assessed/performed (mildly impulsive) Overall Cognitive Status: Within Functional Limits for tasks assessed                                          Exercises      General Comments General comments (skin integrity, edema, etc.): BP 175/75 prior to mobility. BP 186/90 upon  return to room, then 184/85 after return to supine. RN notified.      Pertinent Vitals/Pain Pain Assessment Pain Assessment: Faces Faces Pain Scale: Hurts little more Pain Location: head/neck Pain Descriptors / Indicators: Pressure, Headache Pain Intervention(s): Monitored during session, Heat applied    Home Living                           Prior Function            PT Goals (current goals can now be found in the care plan section) Acute Rehab PT Goals Patient Stated Goal: decrease pain Progress towards PT goals: Progressing toward goals    Frequency    Min 4X/week      PT Plan Current plan remains appropriate    Co-evaluation              AM-PAC PT "6 Clicks" Mobility   Outcome Measure  Help needed turning from your back to your side while in a flat bed without using bedrails?: None Help needed moving from lying on your back to sitting on the side of a flat bed without using bedrails?: A Little Help needed moving to and from a bed to a chair (including a wheelchair)?: A Little Help needed standing up from a chair using your arms (e.g., wheelchair or bedside chair)?: A Little Help needed to walk in hospital room?: A Little Help needed climbing 3-5 steps with a railing? : A Lot 6 Click Score: 18    End of Session Equipment Utilized During Treatment: Gait belt Activity Tolerance: Patient tolerated treatment well Patient left: in bed;with call bell/phone within reach;with family/visitor present Nurse Communication: Mobility status PT Visit Diagnosis: Pain;Unsteadiness on feet (R26.81)     Time: 6789-3810 PT Time Calculation (min) (ACUTE ONLY): 29 min  Charges:  $Gait Training: 23-37 mins                     Lorrin Goodell, [place]  Office # [phone] Pager [phone]    Lorriane Shire 02/01/2022, 1:32 PM

## 2022-02-01 NOTE — Progress Notes (Signed)
Both peripheral Ivs had become dislodged from patient's arm.  RN placed an IV team consult.  Will give prn bp medications when new IV is obtained.

## 2022-02-01 NOTE — Progress Notes (Signed)
  NEUROSURGERY PROGRESS NOTE   Pt seen and examined. No issues overnight. HA and neck pain unchanged  EXAM: Temp:  [98.4 F (36.9 C)-99.6 F (37.6 C)] 99.6 F (37.6 C) (01/20 0800) Pulse Rate:  [50-70] 57 (01/20 0800) Resp:  [8-22] 14 (01/20 0800) BP: (153-190)/(61-143) 175/82 (01/20 0800) SpO2:  [84 %-96 %] 94 % (01/20 0800) Intake/Output      01/19 0701 01/20 0700 01/20 0701 01/21 0700   P.O. 960    Total Intake(mL/kg) 960 (8.8)    Net +960         Urine Occurrence 3 x     Awake, alert, oriented Speech fluent CN intact, EOMI MAE good strength   LABS: Lab Results  Component Value Date   CREATININE 0.80 01/26/2022   BUN 19 01/26/2022   NA 143 01/26/2022   K 3.8 01/26/2022   CL 107 01/26/2022   CO2 24 01/26/2022   Lab Results  Component Value Date   WBC 14.1 (H) 01/26/2022   HGB 17.0 01/26/2022   HCT 50.0 01/26/2022   MCV 92.8 01/26/2022   PLT 260 01/26/2022   TCD: Date POD PCO2 HCT BP   MCA ACA PCA OPHT SIPH VERT Basilar  1/15 RH     50 158/ 76 Right  Left   63  64   -33  -46   '29  25   21  27   '$ 92  51   -41  -36   -39       1/17 GC         Right  Left   52  64   -51  -28   30  -'20   19  23   27  24   '$ -32  -30   -45       1/17JH       171/75 Right  Left   51  53   -24  -41   -32  '18   14  20     '$ *  20   -23  -28   -34        IMPRESSION: - 67 y.o. male SAH d#6 s/p coil embolization basilar apex aneurysm. Neurologically intact   PLAN: - Cont to mobilize  - Cont Nimotop - Cont to Monitor exam for spasm - Would plan on d/c home early next week if stable    Consuella Lose, MD [facility]

## 2022-02-02 MED ORDER — LABETALOL HCL 5 MG/ML IV SOLN: 20.0000 mg | INTRAVENOUS | Status: DC | PRN

## 2022-02-02 MED ORDER — BISACODYL 10 MG RE SUPP: 10.0000 mg | Freq: Every day | RECTAL | Status: DC | PRN

## 2022-02-02 MED ORDER — POLYETHYLENE GLYCOL 3350 17 G PO PACK
17.0000 g | PACK | Freq: Every day | ORAL | Status: DC | PRN
  Administered 2022-02-02: 17 g via ORAL
  Filled 2022-02-02: qty 1

## 2022-02-02 MED ORDER — LABETALOL HCL 5 MG/ML IV SOLN
20.0000 mg | INTRAVENOUS | Status: DC | PRN
  Administered 2022-02-02 – 2022-02-05 (×3): 20 mg via INTRAVENOUS
  Filled 2022-02-02 (×3): qty 4

## 2022-02-02 MED ORDER — SENNA 8.6 MG PO TABS: 1.0000 | ORAL_TABLET | Freq: Every day | ORAL | Status: DC | PRN

## 2022-02-02 MED ORDER — DEXAMETHASONE SODIUM PHOSPHATE 4 MG/ML IJ SOLN
4.0000 mg | Freq: Four times a day (QID) | INTRAMUSCULAR | Status: AC
  Administered 2022-02-02 – 2022-02-04 (×7): 4 mg via INTRAVENOUS
  Filled 2022-02-02 (×8): qty 1

## 2022-02-02 MED ORDER — FLEET ENEMA 7-19 GM/118ML RE ENEM: 1.0000 | ENEMA | Freq: Every day | RECTAL | Status: DC | PRN

## 2022-02-02 NOTE — Progress Notes (Signed)
  NEUROSURGERY PROGRESS NOTE   Pt seen and examined. No issues overnight. HA worse this am, episodes of confusion overnight.  EXAM: Temp:  [97.9 F (36.6 C)-99.1 F (37.3 C)] 99.1 F (37.3 C) (01/21 0800) Pulse Rate:  [59-70] 69 (01/21 0800) Resp:  [10-20] 20 (01/21 0800) BP: (153-207)/(66-164) 182/89 (01/21 0800) SpO2:  [89 %-96 %] 90 % (01/21 0800) Intake/Output      01/20 0701 01/21 0700 01/21 0701 01/22 0700   P.O.     Total Intake(mL/kg)     Net          Urine Occurrence 3 x     Awake, alert Speech fluent CN intact, EOMI MAE good strength   LABS: Lab Results  Component Value Date   CREATININE 0.80 01/26/2022   BUN 19 01/26/2022   NA 143 01/26/2022   K 3.8 01/26/2022   CL 107 01/26/2022   CO2 24 01/26/2022   Lab Results  Component Value Date   WBC 14.1 (H) 01/26/2022   HGB 17.0 01/26/2022   HCT 50.0 01/26/2022   MCV 92.8 01/26/2022   PLT 260 01/26/2022   TCD: Date POD PCO2 HCT BP   MCA ACA PCA OPHT SIPH VERT Basilar  1/15 RH     50 158/ 76 Right  Left   63  64   -33  -46   '29  25   21  27   '$ 92  51   -41  -36   -39       1/17 GC         Right  Left   52  64   -51  -28   30  -'20   19  23   27  24   '$ -32  -30   -45       1/17JH       171/75 Right  Left   51  53   -24  -41   -32  '18   14  20     '$ *  20   -23  -28   -34        IMPRESSION: - 67 y.o. male SAH d#6 s/p coil embolization basilar apex aneurysm. Neurologically non-focal, likely ICU delirium  PLAN: - Cont to mobilize  - Cont Nimotop - Cont to Monitor exam for spasm - Can start steroids for HA - Will add senokot, enema PRN    Consuella Lose, MD [facility]

## 2022-02-02 NOTE — Progress Notes (Signed)
Pt hypertensive with SBP>180s despite multiple doses of ordered hydralazine.  Within 10 minutes of each IV hydralazine administration patient complains of "severe pounding headache". MD notified hydralazine changed to labetalol.

## 2022-02-03 ENCOUNTER — Inpatient Hospital Stay (HOSPITAL_COMMUNITY): Payer: Medicare HMO

## 2022-02-03 DIAGNOSIS — I608 Other nontraumatic subarachnoid hemorrhage: Secondary | ICD-10-CM

## 2022-02-03 LAB — BASIC METABOLIC PANEL
Anion gap: 14 (ref 5–15)
BUN: 21 mg/dL (ref 8–23)
CO2: 24 mmol/L (ref 22–32)
Calcium: 9.1 mg/dL (ref 8.9–10.3)
Chloride: 90 mmol/L — ABNORMAL LOW (ref 98–111)
Creatinine, Ser: 0.81 mg/dL (ref 0.61–1.24)
GFR, Estimated: >60 mL/min (ref >60)
Glucose, Bld: 143 mg/dL — ABNORMAL HIGH (ref 70–99)
Potassium: 3.2 mmol/L — ABNORMAL LOW (ref 3.5–5.1)
Sodium: 128 mmol/L — ABNORMAL LOW (ref 135–145)

## 2022-02-03 LAB — CBC
HCT: 45.5 % (ref 39.0–52.0)
Hemoglobin: 16.2 g/dL (ref 13.0–17.0)
MCH: 31.9 pg (ref 26.0–34.0)
MCHC: 35.6 g/dL (ref 30.0–36.0)
MCV: 89.6 fL (ref 80.0–100.0)
Platelets: 253 10*3/uL (ref 150–400)
RBC: 5.08 MIL/uL (ref 4.22–5.81)
RDW: 12 % (ref 11.5–15.5)
WBC: 13.2 10*3/uL — ABNORMAL HIGH (ref 4.0–10.5)
nRBC: 0 % (ref 0.0–0.2)

## 2022-02-03 NOTE — Progress Notes (Addendum)
OT Cancellation Note  Patient Details Name: Joe [name] MRN: [id number] DOB: Jun 06, 1955   Cancelled Treatment:    Reason Eval/Treat Not Completed: Other (comment)  Pt much different from previous sessions with therapy where he was working on selfcare in standing at supervision level and ambulating throughout the hallway at supervision greater than 700'.  Per PT and nursing pt now confused, severe posterior lean in sitting and standing, pulling at lines with self removal of IVs secondary to confusion.  Now with nursing having difficulty taking oral medications secondary to attention level.  Will hold off on OT today and check back tomorrow to see if he is closer to previous baseline and safe to continue treatment.    Elenora Hawbaker OTR/L 02/03/2022, 12:14 PM

## 2022-02-03 NOTE — Progress Notes (Signed)
  NEUROSURGERY PROGRESS NOTE   Pt seen and examined. Pt noted to have more confusion today, not able to work with PT/OT. Has had episodes of urinary retention today requiring straight cath  EXAM: Temp:  [98.3 F (36.8 C)-100.3 F (37.9 C)] 98.3 F (36.8 C) (01/22 1600) Pulse Rate:  [71-94] 76 (01/22 1700) Resp:  [11-27] 13 (01/22 1700) BP: (125-196)/(47-159) 151/47 (01/22 1700) SpO2:  [88 %-97 %] 93 % (01/22 1700) Intake/Output      01/21 0701 01/22 0700 01/22 0701 01/23 0700   I.V. (mL/kg) 408.1 (3.7) 537.1 (4.9)   Total Intake(mL/kg) 408.1 (3.7) 537.1 (4.9)   Urine (mL/kg/hr) 1 (0) 1050 (0.9)   Stool 0    Total Output 1 1050   Net +407.1 -512.9        Urine Occurrence 5 x 1 x   Stool Occurrence 2 x     Awake, alert, confused Speech fluent CN intact, EOMI MAE good strength, no drift   LABS: Lab Results  Component Value Date   CREATININE 0.80 01/26/2022   BUN 19 01/26/2022   NA 143 01/26/2022   K 3.8 01/26/2022   CL 107 01/26/2022   CO2 24 01/26/2022   Lab Results  Component Value Date   WBC 14.1 (H) 01/26/2022   HGB 17.0 01/26/2022   HCT 50.0 01/26/2022   MCV 92.8 01/26/2022   PLT 260 01/26/2022   TCD: Date POD PCO2 HCT BP   MCA ACA PCA OPHT SIPH VERT Basilar  1/15 RH     50 158/ 76 Right  Left   63  64   -33  -46   '29  25   21  27   '$ 92  51   -41  -36   -39       1/17 GC         Right  Left   52  64   -51  -28   30  -'20   19  23   27  24   '$ -32  -30   -45       1/17JH       171/75 Right  Left   51  53   -24  -41   -32  '18   14  20     '$ *  20   -23  -28   -34        IMPRESSION: - 67 y.o. male SAH d#7 s/p coil embolization basilar apex aneurysm. Neurologically non-focal, likely ICU delirium however with worsening and need for straight cath raises concern for infection. No fever but has been getting Tylenol.  PLAN: - Will check CBC/BMP and UA/micro. Pending WBC, may consider blood cultures as well. - Cont  Nimotop - Cont to Monitor exam for spasm    Consuella Lose, MD [facility]

## 2022-02-03 NOTE — Progress Notes (Signed)
Physical Therapy Treatment Patient Details Name: Joe [name] MRN: [id number] DOB: [birth year] Today's Date: 02/03/2022   History of Present Illness Patient is a 67 y/o male who presents on 1/14 with sudden onset of weakness, N/V, HA, diaphoresis. 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 PMH includes CHF, VF, OSA, AICD.    PT Comments    Pt with noted cognitive and mobility regression. Pt with confusion vs hospital delirium. Pt with severe posterior bias today inhibiting patients able to sit EOB and stand this date. Pt was amb 700' without AD 2 days ago (Saturday 1/20). Pt only oriented to self and hyper focused on "writing down these numbers to win the lottery." Pt re-oriented however pt with no recall. Pt restless and pulling at all lines. Pulse ox changed to L toe as pt kept pulling off pulse ox on R hand. Pt given activity belt and pen and paper. RN gave pain medicine for headache. Pt calmed down at end of session. MD notified of cognitive and functional change. Pending progress pt may need AIR upon d/c pending cognition. Acute PT to cont to follow.   Recommendations for follow up therapy are one component of a multi-disciplinary discharge planning process, led by the attending physician.  Recommendations may be updated based on patient status, additional functional criteria and insurance authorization.  Follow Up Recommendations  Home health PT (pending progress may need AIR)     Assistance Recommended at Discharge Frequent or constant Supervision/Assistance  Patient can return home with the following Assistance with cooking/housework;Assist for transportation;Help with stairs or ramp for entrance;A lot of help with walking and/or transfers;A lot of help with bathing/dressing/bathroom   Equipment Recommendations   (TBD)    Recommendations for Other Services       Precautions / Restrictions Precautions Precautions:  Fall Precaution Comments: SBP <180, pt with noted confusion Restrictions Weight Bearing Restrictions: No     Mobility  Bed Mobility Overal bed mobility: Needs Assistance Bed Mobility: Supine to Sit, Sit to Supine     Supine to sit: Supervision, Max assist, +2 for safety/equipment, HOB elevated Sit to supine: Max assist, +2 for physical assistance, +2 for safety/equipment, HOB elevated   General bed mobility comments: pt with posterior bias requiring max directional verbal and tactile cues in addition to physical assist to elevate trunk to EOB, max directional cues to return trunk to supine, maxA for LE management back into bed due to impaired comprehension of commands/task at hand, posterior bias and inability to sequence vs motor plan    Transfers Overall transfer level: Needs assistance Equipment used: 2 person hand held assist Transfers: Sit to/from Stand Sit to Stand: Supervision, Max assist, +2 physical assistance           General transfer comment: modAx2 to power up however pt with severe retrolpulsion/posterior bias requiring constant verbal cues to lean forward, pt with poor body awareness and unaware he is falling backwards. PT and RN attemped x3 to stand patient to assist with using urinal at bedside however unsuccessful and unable to maintain standing >10 sec    Ambulation/Gait               General Gait Details: unable this date   Stairs             Wheelchair Mobility    Modified Rankin (Stroke Patients Only) Modified Rankin (Stroke Patients Only) Pre-Morbid Rankin Score: No symptoms Modified Rankin: Severe disability  Balance Overall balance assessment: Needs assistance Sitting-balance support: Feet supported, Bilateral upper extremity supported Sitting balance-Leahy Scale: Poor Sitting balance - Comments: pt with severe retropulsion/posterior bias without ability to recognize or self correct despite max verbal and tactile cues    Standing balance support: During functional activity, Bilateral upper extremity supported Standing balance-Leahy Scale: Zero Standing balance comment: pt with severe posterior bias, weight on heels, unable to recognize falling backwards, reports feeling like he is falling forward not backwards                            Cognition Arousal/Alertness: Awake/alert Behavior During Therapy: Restless (mildly impulsive) Overall Cognitive Status: Impaired/Different from baseline Area of Impairment: Orientation, Attention, Memory, Following commands, Safety/judgement, Awareness, Problem solving                 Orientation Level: Disoriented to, Situation, Time, Place (states he is in [place] [address]) Current Attention Level: Focused Memory: Decreased short-term memory Following Commands: Follows one step commands inconsistently Safety/Judgement: Decreased awareness of deficits, Decreased awareness of safety Awareness: Emergent (states "I have to go to the bathroom") Problem Solving: Slow processing, Decreased initiation, Requires verbal cues, Difficulty sequencing, Requires tactile cues General Comments: pt with cognitive regression today. pt confused vs. hospital delirium, pt restless and attempting to pull off all lines. Pt oriented to self only, thinks we are in [place]. Hyperfocused on "writing down these numbers to win the lottery." pt with limited command follow due to overall impaired comprehension. pt with no body/spatial awareness this date        Exercises      General Comments General comments (skin integrity, edema, etc.): systolic BP 893Y, pt confused, only urinated minimally      Pertinent Vitals/Pain Pain Assessment Pain Assessment: 0-10 Pain Score:  (pt reports 11-12) Pain Location: head    Home Living                          Prior Function            PT Goals (current goals can now be found in the care  plan section) Acute Rehab PT Goals PT Goal Formulation: With patient Time For Goal Achievement: 02/10/22 Potential to Achieve Goals: Good Progress towards PT goals: Not progressing toward goals - comment (pt with cognitive regression)    Frequency    Min 4X/week      PT Plan Current plan remains appropriate    Co-evaluation              AM-PAC PT "6 Clicks" Mobility   Outcome Measure  Help needed turning from your back to your side while in a flat bed without using bedrails?: Total Help needed moving from lying on your back to sitting on the side of a flat bed without using bedrails?: Total Help needed moving to and from a bed to a chair (including a wheelchair)?: Total Help needed standing up from a chair using your arms (e.g., wheelchair or bedside chair)?: Total Help needed to walk in hospital room?: Total Help needed climbing 3-5 steps with a railing? : Total 6 Click Score: 6    End of Session Equipment Utilized During Treatment: Gait belt Activity Tolerance: Other (comment) (impaired by cognitive regression) Patient left: in bed;with call bell/phone within reach;with bed alarm set Nurse Communication: Mobility status PT Visit Diagnosis: Pain;Unsteadiness on feet (R26.81) Pain - part of  body:  (head)     Time: 1937-9024 PT Time Calculation (min) (ACUTE ONLY): 24 min  Charges:  $Therapeutic Activity: 23-37 mins                     Kittie Plater, PT, DPT Acute Rehabilitation Services Secure chat preferred Office #: [phone]    Berline Lopes 02/03/2022, 8:36 AM

## 2022-02-03 NOTE — Progress Notes (Signed)
Transcranial Doppler  Date POD PCO2 HCT BP  MCA ACA PCA OPHT SIPH VERT Basilar  1/15 RH   50 158/ 76 Right  Left   63  64   -33  -46   '29  25   21  27   '$ 92  51   -41  -36   -39      1/17 GC     Right  Left   52  64   -51  -28   30  -'20   19  23   27  24   '$ -32  -30   -45      1/19JH    171/75 Right  Left   51  53   -24  -41   -32  '18   14  20    '$ *  20   -23  -28   -34      1/22 RH    161/62 Right  Left   57  70   -33  -32   38  20   27  33   26  43   -41  -34   -43            Right  Left                                            Right  Left                                            Right  Left                                        MCA = Middle Cerebral Artery      OPHT = Opthalmic Artery     BASILAR = Basilar Artery   ACA = Anterior Cerebral Artery     SIPH = Carotid Siphon PCA = Posterior Cerebral Artery   VERT = Verterbral Artery                   Normal MCA = 62+\-12 ACA = 50+\-12 PCA = 42+\-23   RT [place]: 5.18 LT [place]: 4.38  02/03/2022 12:20 PM Darlin Coco, RDMS, RVT

## 2022-02-04 LAB — URINALYSIS, ROUTINE W REFLEX MICROSCOPIC
Bacteria, UA: NONE SEEN
Bilirubin Urine: NEGATIVE
Glucose, UA: NEGATIVE mg/dL
Ketones, ur: NEGATIVE mg/dL
Leukocytes,Ua: NEGATIVE
Nitrite: NEGATIVE
Protein, ur: 30 mg/dL — AB
Specific Gravity, Urine: 1.015 (ref 1.005–1.030)
pH: 6 (ref 5.0–8.0)

## 2022-02-04 LAB — TRIGLYCERIDES: Triglycerides: 83 mg/dL (ref <150)

## 2022-02-04 MED ORDER — SODIUM CHLORIDE 1 G PO TABS
1.0000 g | ORAL_TABLET | Freq: Three times a day (TID) | ORAL | Status: DC
  Administered 2022-02-04 – 2022-02-07 (×10): 1 g via ORAL
  Filled 2022-02-04 (×10): qty 1

## 2022-02-04 MED ORDER — POTASSIUM CHLORIDE CRYS ER 20 MEQ PO TBCR
40.0000 meq | EXTENDED_RELEASE_TABLET | Freq: Once | ORAL | Status: AC
  Administered 2022-02-04: 40 meq via ORAL
  Filled 2022-02-04: qty 2

## 2022-02-04 NOTE — Progress Notes (Signed)
Physical Therapy Treatment Patient Details Name: Joe [name] MRN: [id number] DOB: 02-Oct-1955 Today's Date: 02/04/2022   History of Present Illness Patient is a 67 y/o male who presents on 1/14 with sudden onset of weakness, N/V, HA, diaphoresis. 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 PMH includes CHF, VF, OSA, AICD.    PT Comments    Pt remains to have confusion vs hospital delirium however demonstrated improved functional ability today. Pt ambulated with min/modAx2 via bilat HHA. Pt with veering to the R, episodes of LOB to the R with narrow base of support, uncoordinated steps, and increased falls risk. Pt continues to have hallucinations "things flying around" and remains confused on place and situation. Pt easily distracted with poor attn span, impaired memory, impulsivity, and demonstrated decreased insight to safety and deficits. PT now recommending AIR upon d/c as pt has regressed both cognitively and functionally since Saturday and would benefit from aggressive rehab program to address above deficits as pt was indep PTA. Acute PT to cont to follow.    Recommendations for follow up therapy are one component of a multi-disciplinary discharge planning process, led by the attending physician.  Recommendations may be updated based on patient status, additional functional criteria and insurance authorization.  Follow Up Recommendations  Acute inpatient rehab (3hours/day)     Assistance Recommended at Discharge Frequent or constant Supervision/Assistance  Patient can return home with the following Assistance with cooking/housework;Assist for transportation;Help with stairs or ramp for entrance;A lot of help with walking and/or transfers;A lot of help with bathing/dressing/bathroom   Equipment Recommendations   (TBD at next venue)    Recommendations for Other Services       Precautions / Restrictions Precautions Precautions:  Fall Precaution Comments: SBP <180, pt with noted confusion Restrictions Weight Bearing Restrictions: No     Mobility  Bed Mobility Overal bed mobility: Needs Assistance Bed Mobility: Supine to Sit     Supine to sit: +2 for safety/equipment, HOB elevated, Min assist     General bed mobility comments: max directional verbal and tactile cues, minA to direct transfer and for trunk elevation, increased time    Transfers Overall transfer level: Needs assistance Equipment used: 2 person hand held assist Transfers: Sit to/from Stand Sit to Stand: Mod assist, +2 physical assistance           General transfer comment: modAx2 to power up, increased time, max directional verbal cues    Ambulation/Gait Ambulation/Gait assistance: Min assist, Mod assist, +2 physical assistance (RN followed with chair) Gait Distance (Feet): 150 Feet Assistive device: 2 person hand held assist Gait Pattern/deviations: Step-through pattern, Decreased stride length, Drifts right/left, Staggering right Gait velocity: decreased Gait velocity interpretation: <1.31 ft/sec, indicative of household ambulator   General Gait Details: pt unsteady with several LOB to the R, pt with R bias, veers R. with increased time pt able to follow directional cues to get back to his room. Pt continues with posterior bias requiring minA posteriorly to keep pt in neutral.   Stairs             Wheelchair Mobility    Modified Rankin (Stroke Patients Only) Modified Rankin (Stroke Patients Only) Pre-Morbid Rankin Score: No symptoms Modified Rankin: Moderately severe disability     Balance Overall balance assessment: Needs assistance Sitting-balance support: Feet supported, Bilateral upper extremity supported Sitting balance-Leahy Scale: Fair Sitting balance - Comments: pt much better with maintaining midline posture at EOB today, no posterior bias/retropulsion  Standing balance support: During functional  activity, Bilateral upper extremity supported Standing balance-Leahy Scale: Poor Standing balance comment: pt with slight posterior bias in standing, minA from PT posteriorly to maintain neutral weightbearing. when PT took away posterior A while pt washing hands at sink pt with LOB back wards requiring minA to regain balance, pt did stand to urinate with minA and no UE support                            Cognition Arousal/Alertness: Awake/alert Behavior During Therapy: Restless (impulsive) Overall Cognitive Status: Impaired/Different from baseline Area of Impairment: Orientation, Attention, Memory, Following commands, Safety/judgement, Awareness, Problem solving                 Orientation Level: Disoriented to, Situation, Time, Place (stated in [place], green & white building, reason for being in here was "tragedy". After being reoriented pt did  recall having an aneurysm in the brain but didn't state hospital. pt thought therapist was present to have "fun" that it wasn't a real job) Current Attention Level: Focused (easily distracted both externally and internally, noted hallucinations, things "flying around") Memory: Decreased short-term memory Following Commands: Follows one step commands with increased time, Follows multi-step commands inconsistently, Follows multi-step commands with increased time Safety/Judgement: Decreased awareness of deficits, Decreased awareness of safety Awareness: Emergent (states "I have to go to the bathroom") Problem Solving: Slow processing, Decreased initiation, Requires verbal cues, Difficulty sequencing, Requires tactile cues General Comments: pt remains to have delirium, hallucinations- seeing things "flying around". Hyperfocused on therapists name tag and scrub top. Pt with decreased attn span, impaired memory, impulsive and decreased safety awareness        Exercises      General Comments General comments (skin integrity, edema,  etc.): BP 184/78, RN present to give meds, BP 169/87 s/p 5 min after session otherwise VSS. Pt did request to use bathroom, + void      Pertinent Vitals/Pain Pain Assessment Pain Assessment: Faces Faces Pain Scale: No hurt    Home Living                          Prior Function            PT Goals (current goals can now be found in the care plan section) Acute Rehab PT Goals PT Goal Formulation: With patient Time For Goal Achievement: 02/10/22 Potential to Achieve Goals: Good Progress towards PT goals: Progressing toward goals    Frequency    Min 4X/week      PT Plan Discharge plan needs to be updated    Co-evaluation              AM-PAC PT "6 Clicks" Mobility   Outcome Measure  Help needed turning from your back to your side while in a flat bed without using bedrails?: A Lot Help needed moving from lying on your back to sitting on the side of a flat bed without using bedrails?: A Lot Help needed moving to and from a bed to a chair (including a wheelchair)?: A Lot Help needed standing up from a chair using your arms (e.g., wheelchair or bedside chair)?: A Lot Help needed to walk in hospital room?: A Lot Help needed climbing 3-5 steps with a railing? : Total 6 Click Score: 11    End of Session Equipment Utilized During Treatment: Gait belt Activity Tolerance: Patient tolerated treatment well Patient  left: with call bell/phone within reach;in chair;with chair alarm set;with nursing/sitter in room Nurse Communication: Mobility status PT Visit Diagnosis: Unsteadiness on feet (R26.81);Muscle weakness (generalized) (M62.81);Difficulty in walking, not elsewhere classified (R26.2)     Time: 0315-9458 PT Time Calculation (min) (ACUTE ONLY): 30 min  Charges:  $Gait Training: 8-22 mins $Therapeutic Activity: 8-22 mins                     Kittie Plater, PT, DPT Acute Rehabilitation Services Secure chat preferred Office #: [phone]    Joe [name] 02/04/2022, 9:09 AM

## 2022-02-04 NOTE — Progress Notes (Signed)
Inpatient Rehab Admissions Coordinator Note:   Per updated therapy recommendations patient was screened for CIR candidacy by Michel Santee, PT. At this time, pt appears to be a potential candidate for CIR. I will place an order for rehab consult for full assessment, per our protocol.  Please contact me any with questions.Shann Medal, PT, DPT [phone] 02/04/22 9:53 AM

## 2022-02-04 NOTE — Progress Notes (Signed)
Occupational Therapy Treatment Patient Details Name: Joe [name] MRN: [id number] DOB: 1955-09-10 Today's Date: 02/04/2022   History of present illness Patient is a 67 y/o male who presents on 1/14 with sudden onset of weakness, N/V, HA, diaphoresis. 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 PMH includes CHF, VF, OSA, AICD.   OT comments  Pt currently still confused demonstrating confabulation of stories and not being able to demonstrate intellectual awareness of where he is when told multiple times.  Mod assist for toileting tasks in standing and for completion of grooming tasks at the sink.  Feel he will continue to benefit from acute care OT at this time with transition to AIR level therapy to reach supervision/modified independent level.     Recommendations for follow up therapy are one component of a multi-disciplinary discharge planning process, led by the attending physician.  Recommendations may be updated based on patient status, additional functional criteria and insurance authorization.    Follow Up Recommendations  Acute inpatient rehab (3hours/day)     Assistance Recommended at Discharge Intermittent Supervision/Assistance  Patient can return home with the following  Assistance with cooking/housework;Assist for transportation   Equipment Recommendations  Other (comment) (TBD next venue of care)       Precautions / Restrictions Precautions Precautions: Fall Precaution Comments: SBP <180, pt with noted confusion Restrictions Weight Bearing Restrictions: No       Mobility Bed Mobility Overal bed mobility: Needs Assistance Bed Mobility: Supine to Sit     Supine to sit: Min assist Sit to supine: Min assist   General bed mobility comments: Min demonstrational cueing with min assist to transition LEs off of the EOB and then sit up.    Transfers Overall transfer level: Needs assistance Equipment used:  None Transfers: Sit to/from Stand Sit to Stand: Mod assist, +2 physical assistance     Step pivot transfers: Mod assist     General transfer comment: Mod assist for balance to ambulate in the room and to and from the bathroom sink to the bed.     Balance Overall balance assessment: Needs assistance Sitting-balance support: Feet supported, Bilateral upper extremity supported Sitting balance-Leahy Scale: Fair     Standing balance support: During functional activity, Bilateral upper extremity supported Standing balance-Leahy Scale: Poor Standing balance comment: LOB to the right and posterior in standing                           ADL either performed or assessed with clinical judgement   ADL Overall ADL's : Needs assistance/impaired Eating/Feeding: Set up;Bed level   Grooming: Wash/dry hands;Moderate assistance;Standing Grooming Details (indicate cue type and reason): standing at the sink                 Toilet Transfer: Moderate assistance;Ambulation Toilet Transfer Details (indicate cue type and reason): for standing and use of the urinal Toileting- Clothing Manipulation and Hygiene: Moderate assistance;Sit to/from stand Toileting - Clothing Manipulation Details (indicate cue type and reason): manage gown in standing     Functional mobility during ADLs: Moderate assistance (no device) General ADL Comments: Pt confused but able to re-direct to activities such as toileting, grooming, and eating.  Min assist for supine to sit with mod assist for standing balance and mobility in the room without the use of an assistive device.  Frequent LOB to the right.  Decreased problem solving as he was trying to wash his  hands while holding them under the paper towel dispenser instead of the faucet handle.      Cognition Arousal/Alertness: Awake/alert Behavior During Therapy: WFL for tasks assessed/performed Overall Cognitive Status: Impaired/Different from baseline Area  of Impairment: Orientation, Attention, Memory, Following commands, Safety/judgement, Awareness, Problem solving                 Orientation Level: Disoriented to, Place, Time, Situation Current Attention Level: Focused Memory: Decreased short-term memory Following Commands: Follows one step commands with increased time, Follows multi-step commands inconsistently Safety/Judgement: Decreased awareness of safety, Decreased awareness of deficits Awareness: Intellectual Problem Solving: Slow processing, Requires verbal cues General Comments: Pt with increased confusion, making off the wall statements about where he's at and confabulation of stories thinking I'm from [place] and he is in [place].                   Pertinent Vitals/ Pain       Pain Assessment Pain Assessment: Faces Pain Score: 0-No pain         Frequency  Min 2X/week        Progress Toward Goals  OT Goals(current goals can now be found in the care plan section)  Progress towards OT goals: Not progressing toward goals - comment;OT to reassess next treatment (Pt with medical change since last visit)  Acute Rehab OT Goals Patient Stated Goal: Pt wants to eat OT Goal Formulation: With patient Time For Goal Achievement: 02/12/22 Potential to Achieve Goals: Good  Plan Discharge plan remains appropriate       AM-PAC OT "6 Clicks" Daily Activity     Outcome Measure   Help from another person eating meals?: None Help from another person taking care of personal grooming?: A Lot Help from another person toileting, which includes using toliet, bedpan, or urinal?: A Lot Help from another person bathing (including washing, rinsing, drying)?: A Lot Help from another person to put on and taking off regular upper body clothing?: A Little Help from another person to put on and taking off regular lower body clothing?: A Lot 6 Click Score: 15    End of Session Equipment Utilized During Treatment: Gait  belt  OT Visit Diagnosis: Unsteadiness on feet (R26.81);Muscle weakness (generalized) (M62.81);Other abnormalities of gait and mobility (R26.89);Other symptoms and signs involving cognitive function   Activity Tolerance Patient tolerated treatment well   Patient Left in bed;with call bell/phone within reach   Nurse Communication Mobility status        Time: 6945-0388 OT Time Calculation (min): 27 min  Charges: OT General Charges $OT Visit: 1 Visit OT Treatments $Self Care/Home Management : 23-37 mins  Narelle Schoening OTR/L 02/04/2022, 2:16 PM

## 2022-02-04 NOTE — Progress Notes (Signed)
  NEUROSURGERY PROGRESS NOTE   Pt seen and examined. Somewhat improved confusion today, better able to work with PT/OT.  EXAM: Temp:  [98.3 F (36.8 C)-100.3 F (37.9 C)] 98.9 F (37.2 C) (01/23 0800) Pulse Rate:  [65-87] 70 (01/23 0600) Resp:  [7-22] 16 (01/23 0600) BP: (132-177)/(47-106) 132/99 (01/23 0600) SpO2:  [90 %-96 %] 93 % (01/23 0600) Intake/Output      01/22 0701 01/23 0700 01/23 0701 01/24 0700   I.V. (mL/kg) 741.8 (6.8)    Total Intake(mL/kg) 741.8 (6.8)    Urine (mL/kg/hr) 2550 (1) 700 (2)   Stool     Total Output 2550 700   Net -1808.2 -700        Urine Occurrence 1 x     Awake, alert, confused Speech fluent CN intact, EOMI MAE good strength, no drift   LABS: Lab Results  Component Value Date   CREATININE 0.81 02/03/2022   BUN 21 02/03/2022   NA 128 (L) 02/03/2022   K 3.2 (L) 02/03/2022   CL 90 (L) 02/03/2022   CO2 24 02/03/2022   Lab Results  Component Value Date   WBC 13.2 (H) 02/03/2022   HGB 16.2 02/03/2022   HCT 45.5 02/03/2022   MCV 89.6 02/03/2022   PLT 253 02/03/2022   TCD: Date POD PCO2 HCT BP   MCA ACA PCA OPHT SIPH VERT Basilar  1/15 RH     50 158/ 76 Right  Left   63  64   -33  -46   '29  25   21  27   '$ 92  51   -41  -36   -39       1/17 GC         Right  Left   52  64   -51  -28   30  -'20   19  23   27  24   '$ -32  -30   -45       1/19JH       171/75 Right  Left   51  53   -24  -41   -32  '18   14  20     '$ *  20   -23  -28   -34       1/22 RH       161/62 Right  Left   57  70   -33  -32   38  66   27  78   26  43   -41  -34   -44        IMPRESSION: - 67 y.o. male SAH d#9 s/p coil embolization basilar apex aneurysm. Neurologically non-focal, likely ICU delirium.  - Hyponatremia, ?SIADH  PLAN: - Will check UA/micro - Add salt tabs for now, prefer not to fluid restrict during spasm periord. - Cont Nimotop - Cont to Monitor exam for spasm    Consuella Lose,  MD [facility]

## 2022-02-05 ENCOUNTER — Inpatient Hospital Stay (HOSPITAL_COMMUNITY): Payer: Medicare HMO

## 2022-02-05 DIAGNOSIS — I608 Other nontraumatic subarachnoid hemorrhage: Secondary | ICD-10-CM

## 2022-02-05 LAB — SODIUM: Sodium: 134 mmol/L — ABNORMAL LOW (ref 135–145)

## 2022-02-05 NOTE — Progress Notes (Signed)
Inpatient Rehab Admissions Coordinator:    I spoke with pt in person and his son Samual Beals. Pt. And son state that family is not able to provide 24/7 supervision. I am unable to offer a CIR bed, as I do not anticipate Pt. Would reach mod I goals during short stay on CIR. Pt.'s son is in agreement to pursue SNF. I willl sign off and notify TOC.   Clemens Catholic, [place], [place] Admissions Coordinator  [phone] ([place]) [phone] (office)

## 2022-02-05 NOTE — Progress Notes (Signed)
Patient suddenly tachy in the 140s. EKG performed. Contacted neurosurgery.

## 2022-02-05 NOTE — Progress Notes (Signed)
Physical Therapy Treatment Patient Details Name: Joe [name] MRN: [id number] DOB: 1955/09/02 Today's Date: 02/05/2022   History of Present Illness Patient is a 67 y/o male who presents on 1/14 with sudden onset of weakness, N/V, HA, diaphoresis. 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 PMH includes CHF, VF, OSA, AICD.    PT Comments    Pt continues with confusion but is less restless than yesterday. Pt with c/o being "swimmy headed". We were only able to amb 10' today prior to L knee buckling and unable to support self and falling to the L requiring maxAX2 to assist to chair. Pt stating "I"m so swimmy headed." VSS and RN present. Pt continues to present with impaired coordination, impaired sequencing, decreased insight to deficits and safety, confusion, impaired balance, and  increased falls risk. Continue to recommend AIR upon d/c. Acute PT to cont to follow.    Recommendations for follow up therapy are one component of a multi-disciplinary discharge planning process, led by the attending physician.  Recommendations may be updated based on patient status, additional functional criteria and insurance authorization.  Follow Up Recommendations  Acute inpatient rehab (3hours/day)     Assistance Recommended at Discharge Frequent or constant Supervision/Assistance  Patient can return home with the following Assistance with cooking/housework;Assist for transportation;Help with stairs or ramp for entrance;A lot of help with walking and/or transfers;A lot of help with bathing/dressing/bathroom   Equipment Recommendations   (TBD at next venue)    Recommendations for Other Services       Precautions / Restrictions Precautions Precautions: Fall Precaution Comments: SBP <180, pt with noted confusion Restrictions Weight Bearing Restrictions: No     Mobility  Bed Mobility Overal bed mobility: Needs Assistance Bed Mobility: Sit to  Supine       Sit to supine: Min assist   General bed mobility comments: max directional verbal and tactile cues, minA for safety and to control descent of trunk to bed. Pt able to bring LEs up    Transfers Overall transfer level: Needs assistance Equipment used: 2 person hand held assist Transfers: Sit to/from Stand Sit to Stand: Mod assist, +2 physical assistance   Step pivot transfers: Mod assist, +2 physical assistance       General transfer comment: max directional verbal/tactile cue to sequence pvt from chair to commode with use of rail in bathroom. modA to power up from lower surface height, pt with wide base of support    Ambulation/Gait Ambulation/Gait assistance: Mod assist, +2 physical assistance (RN followed with chair) Gait Distance (Feet): 10 Feet (x2) Assistive device: 2 person hand held assist Gait Pattern/deviations: Step-through pattern, Decreased stride length, Decreased step length - left, Knee flexed in stance - left, Ataxic, Staggering left, Wide base of support Gait velocity: decreased Gait velocity interpretation: <1.31 ft/sec, indicative of household ambulator   General Gait Details: pt able to stand up right and initially was able to ambulate with R HHA, wide base of support and short step length, once pt got to the door pt's L LE began to buckle an pt unable to sequence stepping or maintain upright posture requiring maxAx2 to sit in chair. Pt with report of being "swimmy headed.". BP stable 160s/80s. RN present. pt then ambulated from commode in bathroom to bed, pt required modA posteriorly to prevent retropulsion and R HHA.   Stairs             Wheelchair Mobility    Modified Rankin (  Stroke Patients Only) Modified Rankin (Stroke Patients Only) Pre-Morbid Rankin Score: No symptoms Modified Rankin: Severe disability     Balance Overall balance assessment: Needs assistance Sitting-balance support: Feet supported, Bilateral upper extremity  supported Sitting balance-Leahy Scale: Fair Sitting balance - Comments: pt much better with maintaining midline posture at EOB today, no posterior bias/retropulsion   Standing balance support: During functional activity, Bilateral upper extremity supported Standing balance-Leahy Scale: Poor Standing balance comment: requires external support for standing, unable to stand and urinate today, had to sit on commode                            Cognition Arousal/Alertness: Awake/alert Behavior During Therapy: Impulsive Overall Cognitive Status: Impaired/Different from baseline Area of Impairment: Orientation, Attention, Memory, Following commands, Safety/judgement, Awareness, Problem solving                 Orientation Level: Disoriented to (with cues and increased time pt able to [facility], [place], and aneurysm, pt with noted word finding difficulty) Current Attention Level: Focused Memory: Decreased short-term memory Following Commands: Follows one step commands with increased time, Follows multi-step commands inconsistently Safety/Judgement: Decreased awareness of safety, Decreased awareness of deficits Awareness: Emergent (asked to go to the bathroom) Problem Solving: Slow processing, Requires verbal cues, Difficulty sequencing, Requires tactile cues General Comments: pt with continued confusion but better than yesterday. Pt with delayed processing, impaired motor planning, impaired sequencing, and decreased insight to safety. Pt not pulling at lines today.        Exercises      General Comments General comments (skin integrity, edema, etc.): VSS, RN present, pt with report of "swimmy headed". Pt did have BM and was able to perform hygiene with set up and minimal cues.      Pertinent Vitals/Pain Pain Assessment Pain Assessment: Faces Faces Pain Scale: Hurts little more Pain Location: head Pain Descriptors / Indicators: Grimacing (rubbing head with  hand) Pain Intervention(s): Monitored during session    Home Living                          Prior Function            PT Goals (current goals can now be found in the care plan section) Acute Rehab PT Goals PT Goal Formulation: With patient Time For Goal Achievement: 02/10/22 Potential to Achieve Goals: Good Progress towards PT goals: Progressing toward goals    Frequency    Min 4X/week      PT Plan Discharge plan needs to be updated    Co-evaluation              AM-PAC PT "6 Clicks" Mobility   Outcome Measure  Help needed turning from your back to your side while in a flat bed without using bedrails?: A Lot Help needed moving from lying on your back to sitting on the side of a flat bed without using bedrails?: A Lot Help needed moving to and from a bed to a chair (including a wheelchair)?: A Lot Help needed standing up from a chair using your arms (e.g., wheelchair or bedside chair)?: A Lot Help needed to walk in hospital room?: A Lot Help needed climbing 3-5 steps with a railing? : Total 6 Click Score: 11    End of Session Equipment Utilized During Treatment: Gait belt Activity Tolerance: Patient tolerated treatment well Patient left: with call bell/phone within reach;in chair;with chair  alarm set;with nursing/sitter in room Nurse Communication: Mobility status PT Visit Diagnosis: Unsteadiness on feet (R26.81);Muscle weakness (generalized) (M62.81);Difficulty in walking, not elsewhere classified (R26.2) Pain - part of body:  (head)     Time: 0865-7846 PT Time Calculation (min) (ACUTE ONLY): 32 min  Charges:  $Gait Training: 8-22 mins $Therapeutic Activity: 8-22 mins                     Kittie Plater, PT, DPT Acute Rehabilitation Services Secure chat preferred Office #: [phone]    Berline Lopes 02/05/2022, 8:32 AM

## 2022-02-05 NOTE — Progress Notes (Signed)
  NEUROSURGERY PROGRESS NOTE   Pt seen and examined. Somewhat improved confusion today  EXAM: Temp:  [97.8 F (36.6 C)-99.3 F (37.4 C)] 98.5 F (36.9 C) (01/24 0800) Pulse Rate:  [67-100] 86 (01/24 0800) Resp:  [14-23] 18 (01/24 0800) BP: (139-180)/(69-148) 168/79 (01/24 0800) SpO2:  [80 %-98 %] 95 % (01/24 0800) Intake/Output      01/23 0701 01/24 0700 01/24 0701 01/25 0700   I.V. (mL/kg)     Total Intake(mL/kg)     Urine (mL/kg/hr) 2850 (1.1)    Stool 0    Total Output 2850    Net -2850         Urine Occurrence 2 x 1 x   Stool Occurrence 1 x 1 x    Awake, alert, confused Speech fluent CN intact, EOMI MAE good strength, no drift   LABS: Lab Results  Component Value Date   CREATININE 0.81 02/03/2022   BUN 21 02/03/2022   NA 134 (L) 02/05/2022   K 3.2 (L) 02/03/2022   CL 90 (L) 02/03/2022   CO2 24 02/03/2022   Lab Results  Component Value Date   WBC 13.2 (H) 02/03/2022   HGB 16.2 02/03/2022   HCT 45.5 02/03/2022   MCV 89.6 02/03/2022   PLT 253 02/03/2022   TCD: Date POD PCO2 HCT BP   MCA ACA PCA OPHT SIPH VERT Basilar  1/15 RH     50 158/ 76 Right  Left   63  64   -33  -46   '29  25   21  27   '$ 92  51   -41  -36   -39       1/17 GC         Right  Left   52  64   -51  -28   30  -'20   19  23   27  24   '$ -32  -30   -45       1/19JH       171/75 Right  Left   51  53   -24  -41   -32  '18   14  20     '$ *  20   -23  -28   -34       1/22 RH       161/62 Right  Left   57  70   -33  -32   38  86   27  33   93  43   -41  -34   -60        IMPRESSION: - 67 y.o. male SAH d#10 s/p coil embolization basilar apex aneurysm. Neurologically non-focal, likely ICU delirium.  - Hyponatremia improving  PLAN: - Cont salt tabs, monitor Na - Cont Nimotop - Cont to Monitor exam for spasm - If no change in exam over next 24-48 hrs, would likely be stable for CIR    Consuella Lose, MD [facility]

## 2022-02-05 NOTE — TOC Initial Note (Signed)
Transition of Care [facility]) - Initial/Assessment Note    Patient Details  Name: Joe [name] MRN: [id number] Date of Birth: 1955-09-07  Transition of Care [facility]) CM/SW Contact:    [name], LCSW Phone Number: 02/05/2022, 2:12 PM  Clinical Narrative:                 Patient's son prefers SNF placement since no home support. CSW sent referral out to facilities in network and will present bed offers and ratings list as available.   Expected Discharge Plan: Skilled Nursing Facility Barriers to Discharge: Continued Medical Work up, SNF Pending bed offer, Insurance Authorization   Patient Goals and CMS Choice Patient states their goals for this hospitalization and ongoing recovery are:: Rehab CMS Medicare.gov Compare Post Acute Care list provided to:: Patient Represenative (must comment) Choice offered to / list presented to : Adult [place] ownership interest in [facility].provided to:: Adult Children    Expected Discharge Plan and Services In-house Referral: Clinical Social Work   Post Acute Care Choice: [place] Living arrangements for the past 2 months: [place]                                      Prior Living Arrangements/Services Living arrangements for the past 2 months: Single Family Home Lives with:: Self Patient language and need for interpreter reviewed:: Yes Do you feel safe going back to the place where you live?: Yes      Need for Family Participation in Patient Care: Yes (Comment) Care giver support system in place?: Yes (comment)   Criminal Activity/Legal Involvement Pertinent to Current Situation/Hospitalization: No - Comment as needed  Activities of Daily Living      Permission Sought/Granted Permission sought to share information with : Facility Sport and exercise psychologist, Family Supports Permission granted to share information with : No  Share Information with NAME: [name]  Permission granted  to share info w AGENCY: SNFs  Permission granted to share info w Relationship: Son  Permission granted to share info w Contact Information: [phone]  Emotional Assessment Appearance:: Appears stated age Attitude/Demeanor/Rapport: Unable to Assess Affect (typically observed): Unable to Assess Orientation: : Oriented to Self Alcohol / Substance Use: Not Applicable Psych Involvement: No (comment)  Admission diagnosis:  Subarachnoid hemorrhage (HCC) [I60.9] Nontraumatic subarachnoid hemorrhage ([place]) [I60.9] Patient Active Problem List   Diagnosis Date Noted   Nontraumatic subarachnoid hemorrhage ([place]) 01/26/2022   HTN (hypertension) 02/14/2016   Tobacco abuse 02/14/2016   Dyslipidemia 61/60/7371   Chronic systolic CHF (congestive heart failure), NYHA class 1 (HCC)    Coronary artery disease    Ischemic cardiomyopathy    OSA (obstructive sleep apnea)    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           

## 2022-02-05 NOTE — NC FL2 (Signed)
[place] LEVEL OF CARE FORM     IDENTIFICATION  Patient Name: Joe [name] Birthdate: Sep 04, 1955 Sex: male Admission Date (Current Location): 01/26/2022  [facility] and [place] Number:  Herbalist and Address:  [facility]. [facility], [place] [address] [place], [place] [address]      Provider Number: [id number]  Attending Physician Name and Address:  Consuella Lose, MD  Relative Name and Phone Number:       Current Level of Care: Hospital Recommended Level of Care: [place] Prior Approval Number:    Date Approved/Denied:   PASRR Number: [id number]  Discharge Plan: SNF    Current Diagnoses: Patient Active Problem List   Diagnosis Date Noted   Nontraumatic subarachnoid hemorrhage ([place]) 01/26/2022   HTN (hypertension) 02/14/2016   Tobacco abuse 02/14/2016   Dyslipidemia 52/77/8242   Chronic systolic CHF (congestive heart failure), NYHA class 1 (HCC)    Coronary artery disease    Ischemic cardiomyopathy    OSA (obstructive sleep apnea)    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   Inflammatory reaction due to cardiac device, implant, and graft 03/21/2011    Orientation RESPIRATION BLADDER Height & Weight     Self  Normal Continent Weight: 240 lb (108.9 kg) Height:  '6\' 2"'$  (188 cm)  BEHAVIORAL SYMPTOMS/MOOD NEUROLOGICAL BOWEL NUTRITION STATUS      Continent Diet (See dc summary)  AMBULATORY STATUS COMMUNICATION OF NEEDS Skin   Extensive Assist Verbally Other (Comment) (puncture on groin)                       Personal Care Assistance Level of Assistance  Bathing, Feeding, Dressing Bathing Assistance: Maximum assistance Feeding assistance: Limited assistance Dressing Assistance: Maximum assistance     Functional Limitations Info             SPECIAL CARE FACTORS FREQUENCY  PT (By licensed PT), OT (By licensed OT)     PT Frequency: 5x/week OT Frequency: 5x/week             Contractures Contractures Info: Not present    Additional Factors Info  Code Status, Allergies Code Status Info: Full Allergies Info: Allergies: Isosorbide Dinitrate, Rosuvastatin, Atorvastatin           Current Medications (02/05/2022):  This is the current hospital active medication list Current Facility-Administered Medications  Medication Dose Route Frequency Provider Last Rate Last Admin   acetaminophen (TYLENOL) tablet 650 mg  650 mg Oral Q4H PRN Consuella Lose, MD   650 mg at 02/05/22 3536   Or   acetaminophen (TYLENOL) 160 MG/5ML solution 650 mg  650 mg Per Tube Q4H PRN Consuella Lose, MD       Or   acetaminophen (TYLENOL) suppository 650 mg  650 mg Rectal Q4H PRN Consuella Lose, MD       acetaminophen-codeine (TYLENOL #3) 300-30 MG per tablet 1-2 tablet  1-2 tablet Oral Q4H PRN Consuella Lose, MD   2 tablet at 02/03/22 1443   bisacodyl (DULCOLAX) suppository 10 mg  10 mg Rectal Daily PRN Consuella Lose, MD       Chlorhexidine Gluconate Cloth 2 % PADS 6 each  6 each Topical Daily Consuella Lose, MD   6 each at 02/05/22 0956   docusate sodium (COLACE) capsule 100 mg  100 mg Oral BID Consuella Lose, MD   100 mg at 02/05/22 0956   fenofibrate tablet 160 mg  160 mg Oral Daily Consuella Lose,  MD   160 mg at 02/05/22 0956   hydrALAZINE (APRESOLINE) injection 10 mg  10 mg Intravenous Q2H PRN Consuella Lose, MD   10 mg at 02/05/22 1032   labetalol (NORMODYNE) injection 20 mg  20 mg Intravenous Q2H PRN Consuella Lose, MD   20 mg at 02/05/22 1309   LORazepam (ATIVAN) tablet 0.5 mg  0.5 mg Oral Q8H PRN Vallarie Mare, MD   0.5 mg at 02/05/22 0311   losartan (COZAAR) tablet 50 mg  50 mg Oral Daily Consuella Lose, MD   50 mg at 02/05/22 0956   morphine (PF) 2 MG/ML injection 2 mg  2 mg Intravenous Q2H PRN Consuella Lose, MD   2 mg at 02/03/22 0343   niMODipine (NIMOTOP) capsule 60 mg  60 mg Oral Q4H Consuella Lose, MD   60  mg at 02/05/22 1158   Or   niMODipine (NYMALIZE) 6 MG/ML oral solution 60 mg  60 mg Per Tube Q4H Consuella Lose, MD       nitroGLYCERIN (NITROSTAT) SL tablet 0.4 mg  0.4 mg Sublingual Q5 min PRN Consuella Lose, MD       ondansetron (ZOFRAN-ODT) disintegrating tablet 4 mg  4 mg Oral Q6H PRN Consuella Lose, MD   4 mg at 01/29/22 0905   Or   ondansetron (ZOFRAN) injection 4 mg  4 mg Intravenous Q6H PRN Consuella Lose, MD   4 mg at 01/30/22 1835   ondansetron (ZOFRAN) tablet 4 mg  4 mg Oral Q8H Consuella Lose, MD   4 mg at 02/03/22 0515   pantoprazole (PROTONIX) EC tablet 40 mg  40 mg Oral Daily Consuella Lose, MD   40 mg at 02/05/22 0956   polyethylene glycol (MIRALAX / GLYCOLAX) packet 17 g  17 g Oral Daily PRN Consuella Lose, MD   17 g at 02/02/22 1038   prochlorperazine (COMPAZINE) injection 5 mg  5 mg Intravenous Q4H PRN Dawley, Troy C, DO   5 mg at 02/02/22 0540   senna (SENOKOT) tablet 8.6 mg  1 tablet Oral Daily PRN Consuella Lose, MD       sodium chloride (OCEAN) 0.65 % nasal spray 1 spray  1 spray Each Nare PRN Consuella Lose, MD       sodium chloride tablet 1 g  1 g Oral TID WC Consuella Lose, MD   1 g at 02/05/22 1158   sodium phosphate (FLEET) 7-19 GM/118ML enema 1 enema  1 enema Rectal Daily PRN Consuella Lose, MD         Discharge Medications: Please see discharge summary for a list of discharge medications.  Relevant Imaging Results:  Relevant Lab Results:   Additional Information SSn: [place], [place]

## 2022-02-05 NOTE — Progress Notes (Signed)
Transcranial Doppler  Date POD PCO2 HCT BP  MCA ACA PCA OPHT SIPH VERT Basilar  1/15 RH   50 158/ 76 Right  Left   63  64   -33  -46   '29  25   21  27   '$ 92  51   -41  -36   -39      1/17 GC     Right  Left   52  64   -51  -28   30  -'20   19  23   27  24   '$ -32  -30   -45      1/19JH    171/75 Right  Left   51  53   -24  -41   -32  '18   14  20    '$ *  20   -23  -28   -34      1/22 RH    161/62 Right  Left   57  70   -33  -32   38  20   27  33   26  43   -41  -34   -43      1/24 RH    138/70 Right  Left   56  73   -36  -108   '20  23   18  18   '$ 32  35   -51  -33   -43           Right  Left                                            Right  Left                                        MCA = Middle Cerebral Artery      OPHT = Opthalmic Artery     BASILAR = Basilar Artery   ACA = Anterior Cerebral Artery     SIPH = Carotid Siphon PCA = Posterior Cerebral Artery   VERT = Verterbral Artery                   Normal MCA = 62+\-12 ACA = 50+\-12 PCA = 42+\-23   RT [place]: 3.11 LT [place]: 3.48  02/05/2022 4:39 PM Darlin Coco, RDMS, RVT

## 2022-02-06 LAB — SODIUM: Sodium: 135 mmol/L (ref 135–145)

## 2022-02-06 NOTE — TOC Progression Note (Signed)
Transition of Care [facility]) - Progression Note    Patient Details  Name: Joe [name] MRN: [id number] Date of Birth: 15-Mar-1955  [facility]) CM/SW [place], LCSW Phone Number: 02/06/2022, 2:27 PM  Clinical Narrative:    CSW met with patient, son, and two other family members at bedside and provided SNF bed offers. They will review list and let CSW know their preferences. CSW explained insurance process and how to apply for Medicaid if ever needed.    Expected Discharge Plan: [place] Barriers to Discharge: Continued Medical Work up, SNF Pending bed offer, Ship broker  Expected Discharge Plan and Services In-house Referral: Clinical Social Work   Post Acute Care Choice: [place] Living arrangements for the past 2 months: Single Family Home                                       Social Determinants of Health (SDOH) Interventions SDOH Screenings   Food Insecurity: No Food Insecurity (02/06/2022)  Housing: Low Risk  (02/06/2022)  Transportation Needs: No Transportation Needs (02/06/2022)  Utilities: Not At Risk (02/06/2022)  Tobacco Use: High Risk (01/27/2022)    Readmission Risk Interventions     No data to display

## 2022-02-06 NOTE — Progress Notes (Signed)
Speech Language Pathology Treatment: Cognitive-Linquistic  Patient Details Name: Joe [name] MRN: [id number] DOB: 07/10/1955 Today's Date: 02/06/2022 Time: 5462-7035 SLP Time Calculation (min) (ACUTE ONLY): 27 min  Assessment / Plan / Recommendation Clinical Impression  Pt was seen for cognitive-linguistic treatment. He exhibited some difficulty maintaining alertness for prolonged periods. Pt's overall cognition appears worse than when he was seen by SLP. It is noteworthy; however, that the pt's last session was conducted later in the day and that he was more alert at that time. The impact of this is considered. Pt required prompts for sustained attention and recall of conversational topics. He intermittently became distracted when attempting to select items from the menu to order for breakfast and some tangentiality was noted during conversation. He demonstrated 20% accuracy with time management problems increasing to 100% with verbal prompts for reasoning. Pt demonstrated increased awareness of impairments stating "I get really confused when I read." and "I been getting a bit foggy sometimes." SLP will continue to follow pt.     HPI HPI: Pt is a 67 y.o. male who presented with sudden onset of headache. CT head: large volume of acute subarachnoid hemorrhage in an aneurysmal rupture pattern. 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 PMH: CHF, heart attack, OSA, dyslipidemia.      SLP Plan         Recommendations for follow up therapy are one component of a multi-disciplinary discharge planning process, led by the attending physician.  Recommendations may be updated based on patient status, additional functional criteria and insurance authorization.    Recommendations  Diet recommendations: Thin liquid Liquids provided via: Cup;Straw Medication Administration: Whole meds with liquid Supervision: Patient able to self feed Compensations: Slow rate;Small sips/bites Postural  Changes and/or Swallow Maneuvers: Seated upright 90 degrees                Follow Up Recommendations: Skilled nursing-short term rehab (<3 hours/day) Assistance recommended at discharge: Frequent or constant Supervision/Assistance SLP Visit Diagnosis: Dysphagia, unspecified (R13.10)         Joe [name] I. Hardin Negus, [place], [place] Office number [phone]   Joe [name]  02/06/2022, 10:25 AM

## 2022-02-06 NOTE — Progress Notes (Signed)
Physical Therapy Treatment Patient Details Name: Joe [name] MRN: [id number] DOB: 11/10/1955 Today's Date: 02/06/2022   History of Present Illness Patient is a 67 y/o male who presents on 1/14 with sudden onset of weakness, N/V, HA, diaphoresis. 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 PMH includes CHF, VF, OSA, AICD.    PT Comments    Pt still confused, but conversant and participative.  Emphasis on standing exercise for strengthening/stability/balance,  sit to stand safety and progression of gait stability/safety with the RW   Recommendations for follow up therapy are one component of a multi-disciplinary discharge planning process, led by the attending physician.  Recommendations may be updated based on patient status, additional functional criteria and insurance authorization.  Follow Up Recommendations  Acute inpatient rehab (3hours/day)     Assistance Recommended at Discharge Frequent or constant Supervision/Assistance  Patient can return home with the following Assistance with cooking/housework;Assist for transportation;Help with stairs or ramp for entrance;A lot of help with walking and/or transfers;A lot of help with bathing/dressing/bathroom   Equipment Recommendations   (TBD at next venue)    Recommendations for Other Services       Precautions / Restrictions Precautions Precautions: Fall Precaution Comments: SBP <180, pt with noted confusion     Mobility  Bed Mobility                    Transfers Overall transfer level: Needs assistance Equipment used: Rolling walker (2 wheels) Transfers: Sit to/from Stand Sit to Stand: Mod assist           General transfer comment: moderate cues for direction/ hand placement, some boost and stability assist    Ambulation/Gait Ambulation/Gait assistance: Mod assist, Min assist Gait Distance (Feet): 130 Feet (approx x3 with RW and standing/sitting rest  to regroup) Assistive device: 2 person hand held assist Gait Pattern/deviations: Step-through pattern, Decreased stride length, Decreased step length - left, Ataxic, Staggering left, Wide base of support Gait velocity: decreased Gait velocity interpretation: <1.8 ft/sec, indicate of risk for recurrent falls   General Gait Details: generally unsteady with consistent list to the left, variable BOS,  slower, fatigues quickly, but ready to go more after short rest.   Stairs             Wheelchair Mobility    Modified Rankin (Stroke Patients Only) Modified Rankin (Stroke Patients Only) Pre-Morbid Rankin Score: No symptoms Modified Rankin: Moderately severe disability     Balance Overall balance assessment: Needs assistance Sitting-balance support: Feet supported, Bilateral upper extremity supported, Single extremity supported Sitting balance-Leahy Scale: Fair     Standing balance support: During functional activity, Bilateral upper extremity supported Standing balance-Leahy Scale: Poor Standing balance comment: reliant on a little external support and /or AD                            Cognition Arousal/Alertness: Awake/alert Behavior During Therapy: Impulsive Overall Cognitive Status: Impaired/Different from baseline Area of Impairment: Orientation, Attention, Memory, Following commands, Safety/judgement, Awareness, Problem solving                 Orientation Level: Disoriented to (with cues and increased time pt able to [facility], Max, and aneurysm, pt with noted word finding difficulty) Current Attention Level: Focused Memory: Decreased short-term memory Following Commands: Follows one step commands with increased time, Follows multi-step commands inconsistently Safety/Judgement: Decreased awareness of safety, Decreased awareness of deficits  Awareness: Emergent (asked to go to the bathroom) Problem Solving: Slow processing, Requires verbal  cues, Difficulty sequencing, Requires tactile cues          Exercises General Exercises - Lower Extremity Hip ABduction/ADduction: AROM, Strengthening, Both, 10 reps, Standing (min assist) Hip Flexion/Marching: AROM, Strengthening, Both, 10 reps, Standing (min assist) Mini-Sqauts: AROM, Both, 10 reps, Standing (min assist)    General Comments        Pertinent Vitals/Pain Pain Assessment Pain Assessment: Faces Faces Pain Scale: No hurt Pain Intervention(s): Monitored during session    Home Living                          Prior Function            PT Goals (current goals can now be found in the care plan section) Acute Rehab PT Goals Patient Stated Goal: decrease pain PT Goal Formulation: With patient Time For Goal Achievement: 02/10/22 Potential to Achieve Goals: Good    Frequency    Min 4X/week      PT Plan Discharge plan needs to be updated    Co-evaluation              AM-PAC PT "6 Clicks" Mobility   Outcome Measure  Help needed turning from your back to your side while in a flat bed without using bedrails?: A Lot Help needed moving from lying on your back to sitting on the side of a flat bed without using bedrails?: A Lot Help needed moving to and from a bed to a chair (including a wheelchair)?: A Lot Help needed standing up from a chair using your arms (e.g., wheelchair or bedside chair)?: A Lot Help needed to walk in hospital room?: A Lot Help needed climbing 3-5 steps with a railing? : Total 6 Click Score: 11    End of Session Equipment Utilized During Treatment: Gait belt Activity Tolerance: Patient tolerated treatment well Patient left: with call bell/phone within reach;in chair;with chair alarm set;with nursing/sitter in room Nurse Communication: Mobility status PT Visit Diagnosis: Unsteadiness on feet (R26.81);Muscle weakness (generalized) (M62.81);Difficulty in walking, not elsewhere classified (R26.2) Pain - part of body:   (head)     Time: 1025-8527 PT Time Calculation (min) (ACUTE ONLY): 22 min  Charges:  $Gait Training: 8-22 mins                     02/06/2022  Ginger Carne., PT Acute Rehabilitation Services [phone]  (office)   Tessie Fass Mckinnon Glick 02/06/2022, 5:29 PM

## 2022-02-06 NOTE — Progress Notes (Signed)
Patient ID: Joe [name], male   DOB: 03-Aug-1955, 67 y.o.   MRN: [id number] BP (!) 168/86   Pulse 75   Temp 98 F (36.7 C) (Oral)   Resp 18   Ht '6\' 2"'$  (1.88 m)   Wt 108.9 kg   SpO2 94%   BMI 30.81 kg/m  Alert, confused at times, speech is clear. Following commands Moving all extremities Possible CIR this weekend

## 2022-02-07 ENCOUNTER — Inpatient Hospital Stay (HOSPITAL_COMMUNITY): Payer: Medicare HMO

## 2022-02-07 DIAGNOSIS — I10 Essential (primary) hypertension: Secondary | ICD-10-CM | POA: Diagnosis not present

## 2022-02-07 DIAGNOSIS — R519 Headache, unspecified: Secondary | ICD-10-CM | POA: Diagnosis not present

## 2022-02-07 DIAGNOSIS — E871 Hypo-osmolality and hyponatremia: Secondary | ICD-10-CM | POA: Diagnosis not present

## 2022-02-07 DIAGNOSIS — I252 Old myocardial infarction: Secondary | ICD-10-CM | POA: Diagnosis not present

## 2022-02-07 DIAGNOSIS — I608 Other nontraumatic subarachnoid hemorrhage: Secondary | ICD-10-CM | POA: Diagnosis not present

## 2022-02-07 DIAGNOSIS — I609 Nontraumatic subarachnoid hemorrhage, unspecified: Secondary | ICD-10-CM | POA: Diagnosis not present

## 2022-02-07 DIAGNOSIS — R279 Unspecified lack of coordination: Secondary | ICD-10-CM | POA: Diagnosis not present

## 2022-02-07 DIAGNOSIS — I5022 Chronic systolic (congestive) heart failure: Secondary | ICD-10-CM | POA: Diagnosis not present

## 2022-02-07 DIAGNOSIS — I69022 Dysarthria following nontraumatic subarachnoid hemorrhage: Secondary | ICD-10-CM | POA: Diagnosis not present

## 2022-02-07 DIAGNOSIS — M6281 Muscle weakness (generalized): Secondary | ICD-10-CM | POA: Diagnosis not present

## 2022-02-07 DIAGNOSIS — I604 Nontraumatic subarachnoid hemorrhage from basilar artery: Secondary | ICD-10-CM | POA: Diagnosis not present

## 2022-02-07 DIAGNOSIS — I2510 Atherosclerotic heart disease of native coronary artery without angina pectoris: Secondary | ICD-10-CM | POA: Diagnosis not present

## 2022-02-07 DIAGNOSIS — E785 Hyperlipidemia, unspecified: Secondary | ICD-10-CM | POA: Diagnosis not present

## 2022-02-07 DIAGNOSIS — I69091 Dysphagia following nontraumatic subarachnoid hemorrhage: Secondary | ICD-10-CM | POA: Diagnosis not present

## 2022-02-07 DIAGNOSIS — I69011 Memory deficit following nontraumatic subarachnoid hemorrhage: Secondary | ICD-10-CM | POA: Diagnosis not present

## 2022-02-07 LAB — BASIC METABOLIC PANEL
Anion gap: 8 (ref 5–15)
BUN: 13 mg/dL (ref 8–23)
CO2: 27 mmol/L (ref 22–32)
Calcium: 9.1 mg/dL (ref 8.9–10.3)
Chloride: 100 mmol/L (ref 98–111)
Creatinine, Ser: 0.7 mg/dL (ref 0.61–1.24)
GFR, Estimated: >60 mL/min (ref >60)
Glucose, Bld: 122 mg/dL — ABNORMAL HIGH (ref 70–99)
Potassium: 4 mmol/L (ref 3.5–5.1)
Sodium: 135 mmol/L (ref 135–145)

## 2022-02-07 MED ORDER — ACETAMINOPHEN 325 MG PO TABS: 650.0000 mg | ORAL_TABLET | Freq: Four times a day (QID) | ORAL | 0 refills | Status: AC | PRN

## 2022-02-07 MED ORDER — SODIUM CHLORIDE 1 G PO TABS: 1.0000 g | ORAL_TABLET | Freq: Three times a day (TID) | ORAL | 0 refills | Status: DC

## 2022-02-07 NOTE — Progress Notes (Signed)
Per MD, pt adequate for discharge to SNF, [facility]; Report given to [place], [place] at [phone]; awaiting family member to transport patient to [facility].  Electronically signed: Royal Piedra 02/07/2022

## 2022-02-07 NOTE — TOC Progression Note (Addendum)
Transition of Care [facility]) - Progression Note    Patient Details  Name: Joe [name] MRN: [id number] Date of Birth: 09/28/1955  [facility] CM/SW [place], LCSW Phone Number: 02/07/2022, 12:21 PM  Clinical Narrative:    12pm-CSW met with patient's daughter and son at bedside. They have decided on SNF placement with a preference for [facility]. Patient has a CPAP machine at home but does not use per family; if it is on the DC Summary patient will need to bring it from home. Family also able to bring in [place] if needed. [facility] reviewing.   12:45pm-[facility] able to accept patient if stable this weekend. CSW submitted for insurance authorization and received approval, Ref# [id number], effective 02/08/2022-02/11/2022.   Of note, SNF would need DC Summary by 3:30pm on day of discharge and will need scripts printed and signed for any controlled substances.   Son updated and will be able to transport patient by car at discharge. Will continue to follow.    Expected Discharge Plan: [place] Barriers to Discharge: Continued Medical Work up, SNF Pending bed offer, Ship broker  Expected Discharge Plan and Services In-house Referral: Clinical Social Work   Post Acute Care Choice: [place] Living arrangements for the past 2 months: Single Family Home                                       Social Determinants of Health (SDOH) Interventions SDOH Screenings   Food Insecurity: No Food Insecurity (02/06/2022)  Housing: Low Risk  (02/06/2022)  Transportation Needs: No Transportation Needs (02/06/2022)  Utilities: Not At Risk (02/06/2022)  Tobacco Use: High Risk (01/27/2022)    Readmission Risk Interventions     No data to display

## 2022-02-07 NOTE — Discharge Summary (Signed)
Physician Discharge Summary  Patient ID: Joe [name] MRN: [id number] DOB/AGE: 07-31-1955 67 y.o.  Admit date: 01/26/2022 Discharge date: 02/07/2022  Admission Diagnoses:subarachnoid hemorrhage due to aneurysm rupture Basilar apex aneurysm Discharge Diagnoses: same Principal Problem:   Nontraumatic subarachnoid hemorrhage [facility])   Discharged Condition: good  Hospital Course: Joe [name] was admitted and taken to the angiography suite for diagnostic angiograms, and definitive coiling. Post coiling he has been slightly confused but has improved neurologically. Perrl, full eom, tongue and uvula midline, walking with assistance around the ICU, tolerating a regular diet. Ok for discharge  Treatments: surgery: endovascular coiling ruptured aneurysm  Discharge Exam: Blood pressure 133/68, pulse 72, temperature 98.1 F (36.7 C), temperature source Oral, resp. rate 15, height '6\' 2"'$  (1.88 m), weight 108.9 kg, SpO2 96 %. General appearance: alert, cooperative, appears stated age, and no distress Neurologic: Mental status: Alert, oriented, thought content appropriate, alertness: alert, orientation: time, date, person, affect: normal, thought content exhibits logical connections, loose associations Cranial nerves: normal Sensory: normal Motor: grossly normal Reflexes: 2+ and symmetric Coordination: normal Gait: Normal  Disposition: Discharge disposition: 03-Skilled Nursing Facility      [facility]  Allergies as of 02/07/2022       Reactions   Isosorbide Dinitrate Other (See Comments)   headache headache   Rosuvastatin Other (See Comments)   Muscle aches on 5 mg qd and 20 mg qd Muscle aches on 5 mg qd and 20 mg qd Muscle aches on 5 mg qd and 20 mg qd   Atorvastatin Anxiety   Made his irritable and anxious Made his irritable and anxious        Medication List     TAKE these medications    acetaminophen 325 MG tablet Commonly known as: TYLENOL Take 2 tablets (650 mg  total) by mouth every 6 (six) hours as needed for mild pain (or temp > 37.5 C (99.5 F)).   aspirin 81 MG tablet Take 81 mg by mouth daily.   carvedilol 12.5 MG tablet Commonly known as: COREG Take 1 tablet (12.5 mg total) by mouth 2 (two) times daily with a meal.   fenofibrate 160 MG tablet Take 1 tablet (160 mg total) by mouth daily.   fish oil-omega-3 fatty acids 1000 MG capsule Take 4 g by mouth daily.   losartan 50 MG tablet Commonly known as: COZAAR TAKE 1 TABLET(50 MG) BY MOUTH DAILY   Mucinex Fast-Max Cold Flu Nght 12.5-5-325 MG/10ML Liqd Generic drug: diphenhydrAMINE-PE-APAP Take 10 mLs by mouth every 6 (six) hours as needed (cold symptoms).   nitroGLYCERIN 0.4 MG SL tablet Commonly known as: NITROSTAT Place 1 tablet (0.4 mg total) under the tongue every 5 (five) minutes as needed for chest pain. Up to 3 doses   Repatha SureClick 409 MG/ML Soaj Generic drug: Evolocumab Inject 1 pen into the skin every 14 (fourteen) days.   sodium chloride 1 g tablet Take 1 tablet (1 g total) by mouth 3 (three) times daily with meals.         SignedAshok Pall 02/07/2022, 2:21 PM

## 2022-02-07 NOTE — Progress Notes (Signed)
Transcranial Doppler  Date POD PCO2 HCT BP  MCA ACA PCA OPHT SIPH VERT Basilar  1/15 RH   50 158/ 76 Right  Left   63  64   -33  -46   '29  25   21  27   '$ 92  51   -41  -36   -39      1/17 GC     Right  Left   52  64   -51  -28   30  -'20   19  23   27  24   '$ -32  -30   -45      1/19JH    171/75 Right  Left   51  53   -24  -41   -32  '18   14  20    '$ *  20   -23  -28   -34      1/22 RH    161/62 Right  Left   57  70   -33  -32   38  20   27  33   26  43   -41  -34   -43      1/24 RH    138/70 Right  Left   56  73   -36  -108   '20  23   18  18   '$ 32  35   -51  -33   -43      1/26 JH    169/78 Right  Left   55  59   -28  -30   -34  '28   18  19   '$ *  22   -43  -48   -48           Right  Left                                        MCA = Middle Cerebral Artery      OPHT = Opthalmic Artery     BASILAR = Basilar Artery   ACA = Anterior Cerebral Artery     SIPH = Carotid Siphon PCA = Posterior Cerebral Artery   VERT = Verterbral Artery                   Normal MCA = 62+\-12 ACA = 50+\-12 PCA = 42+\-23   RT [place]: 2.11  LT [place]:  2.26  Results can be found under chart review under CV PROC. 02/07/2022 5:12 PM Willi Borowiak RVT, RDMS

## 2022-02-07 NOTE — TOC Transition Note (Signed)
Transition of Care [facility]) - CM/SW Discharge Note   Patient Details  Name: Joe [name] MRN: [id number] Date of Birth: 1955/12/11  [facility] CM/SW Contact:  [name], LCSW Phone Number: 02/07/2022, 2:52 PM   Clinical Narrative:    Patient will DC to: [place] Anticipated DC date: 02/07/22 Family notified: [name] and Engineer, materials by: [name] by car   Per MD patient ready for DC to [facility]. RN to call report prior to discharge [phone]). RN, patient, patient's family, and facility notified of DC. Discharge Summary and FL2 sent to facility.   CSW will sign off for now as social work intervention is no longer needed. Please consult [place] again if new needs arise.     Final next level of care: Skilled Nursing Facility Barriers to Discharge: Barriers Resolved   Patient Goals and CMS Choice CMS Medicare.gov Compare Post Acute Care list provided to:: Patient Represenative (must comment) Choice offered to / list presented to : Adult Children  Discharge Placement     Existing PASRR number confirmed : 02/07/22          Patient chooses bed at: [facility] Patient to be transferred to facility by: car Name of family member notified: Son and daughter Patient and family notified of of transfer: 02/07/22  Discharge Plan and Services Additional resources added to the After Visit Summary for   In-house Referral: Clinical Social Work   Post Acute Care Choice: [place]                               Social Determinants of Health ([place]) Interventions SDOH Screenings   Food Insecurity: No Food Insecurity (02/06/2022)  Housing: Low Risk  (02/06/2022)  Transportation Needs: No Transportation Needs (02/06/2022)  Utilities: Not At Risk (02/06/2022)  Tobacco Use: High Risk (01/27/2022)     Readmission Risk Interventions     No data to display

## 2022-02-10 DIAGNOSIS — I69011 Memory deficit following nontraumatic subarachnoid hemorrhage: Secondary | ICD-10-CM | POA: Diagnosis not present

## 2022-02-10 DIAGNOSIS — I2510 Atherosclerotic heart disease of native coronary artery without angina pectoris: Secondary | ICD-10-CM | POA: Diagnosis not present

## 2022-02-10 DIAGNOSIS — E871 Hypo-osmolality and hyponatremia: Secondary | ICD-10-CM | POA: Diagnosis not present

## 2022-02-10 DIAGNOSIS — E785 Hyperlipidemia, unspecified: Secondary | ICD-10-CM | POA: Diagnosis not present

## 2022-02-10 DIAGNOSIS — I69022 Dysarthria following nontraumatic subarachnoid hemorrhage: Secondary | ICD-10-CM | POA: Diagnosis not present

## 2022-02-10 DIAGNOSIS — I10 Essential (primary) hypertension: Secondary | ICD-10-CM | POA: Diagnosis not present

## 2022-02-12 ENCOUNTER — Ambulatory Visit: Payer: Medicare HMO | Admitting: Cardiology

## 2022-02-12 DIAGNOSIS — I10 Essential (primary) hypertension: Secondary | ICD-10-CM | POA: Diagnosis not present

## 2022-02-12 DIAGNOSIS — R519 Headache, unspecified: Secondary | ICD-10-CM | POA: Diagnosis not present

## 2022-02-12 DIAGNOSIS — E871 Hypo-osmolality and hyponatremia: Secondary | ICD-10-CM | POA: Diagnosis not present

## 2022-02-14 DIAGNOSIS — R519 Headache, unspecified: Secondary | ICD-10-CM | POA: Diagnosis not present

## 2022-02-14 DIAGNOSIS — I10 Essential (primary) hypertension: Secondary | ICD-10-CM | POA: Diagnosis not present

## 2022-02-14 DIAGNOSIS — I2510 Atherosclerotic heart disease of native coronary artery without angina pectoris: Secondary | ICD-10-CM | POA: Diagnosis not present

## 2022-02-14 DIAGNOSIS — E871 Hypo-osmolality and hyponatremia: Secondary | ICD-10-CM | POA: Diagnosis not present

## 2022-02-14 DIAGNOSIS — I69022 Dysarthria following nontraumatic subarachnoid hemorrhage: Secondary | ICD-10-CM | POA: Diagnosis not present

## 2022-02-14 DIAGNOSIS — I69011 Memory deficit following nontraumatic subarachnoid hemorrhage: Secondary | ICD-10-CM | POA: Diagnosis not present

## 2022-02-14 NOTE — Progress Notes (Signed)
Remote ICD transmission.   

## 2022-02-17 DIAGNOSIS — I69011 Memory deficit following nontraumatic subarachnoid hemorrhage: Secondary | ICD-10-CM | POA: Diagnosis not present

## 2022-02-19 DIAGNOSIS — I69011 Memory deficit following nontraumatic subarachnoid hemorrhage: Secondary | ICD-10-CM | POA: Diagnosis not present

## 2022-02-19 DIAGNOSIS — I10 Essential (primary) hypertension: Secondary | ICD-10-CM | POA: Diagnosis not present

## 2022-02-19 DIAGNOSIS — I2510 Atherosclerotic heart disease of native coronary artery without angina pectoris: Secondary | ICD-10-CM | POA: Diagnosis not present

## 2022-02-19 DIAGNOSIS — I5022 Chronic systolic (congestive) heart failure: Secondary | ICD-10-CM | POA: Diagnosis not present

## 2022-02-19 DIAGNOSIS — I609 Nontraumatic subarachnoid hemorrhage, unspecified: Secondary | ICD-10-CM | POA: Diagnosis not present

## 2022-02-20 DIAGNOSIS — I69022 Dysarthria following nontraumatic subarachnoid hemorrhage: Secondary | ICD-10-CM | POA: Diagnosis not present

## 2022-02-20 DIAGNOSIS — I2510 Atherosclerotic heart disease of native coronary artery without angina pectoris: Secondary | ICD-10-CM | POA: Diagnosis not present

## 2022-02-20 DIAGNOSIS — I4901 Ventricular fibrillation: Secondary | ICD-10-CM | POA: Diagnosis not present

## 2022-02-20 DIAGNOSIS — I110 Hypertensive heart disease with heart failure: Secondary | ICD-10-CM | POA: Diagnosis not present

## 2022-02-20 DIAGNOSIS — I69011 Memory deficit following nontraumatic subarachnoid hemorrhage: Secondary | ICD-10-CM | POA: Diagnosis not present

## 2022-02-20 DIAGNOSIS — I69091 Dysphagia following nontraumatic subarachnoid hemorrhage: Secondary | ICD-10-CM | POA: Diagnosis not present

## 2022-02-20 DIAGNOSIS — I255 Ischemic cardiomyopathy: Secondary | ICD-10-CM | POA: Diagnosis not present

## 2022-02-20 DIAGNOSIS — E871 Hypo-osmolality and hyponatremia: Secondary | ICD-10-CM | POA: Diagnosis not present

## 2022-02-20 DIAGNOSIS — I5022 Chronic systolic (congestive) heart failure: Secondary | ICD-10-CM | POA: Diagnosis not present

## 2022-02-21 DIAGNOSIS — I110 Hypertensive heart disease with heart failure: Secondary | ICD-10-CM | POA: Diagnosis not present

## 2022-02-21 DIAGNOSIS — I4901 Ventricular fibrillation: Secondary | ICD-10-CM | POA: Diagnosis not present

## 2022-02-21 DIAGNOSIS — I5022 Chronic systolic (congestive) heart failure: Secondary | ICD-10-CM | POA: Diagnosis not present

## 2022-02-21 DIAGNOSIS — I2510 Atherosclerotic heart disease of native coronary artery without angina pectoris: Secondary | ICD-10-CM | POA: Diagnosis not present

## 2022-02-21 DIAGNOSIS — I69022 Dysarthria following nontraumatic subarachnoid hemorrhage: Secondary | ICD-10-CM | POA: Diagnosis not present

## 2022-02-21 DIAGNOSIS — I255 Ischemic cardiomyopathy: Secondary | ICD-10-CM | POA: Diagnosis not present

## 2022-02-21 DIAGNOSIS — I69091 Dysphagia following nontraumatic subarachnoid hemorrhage: Secondary | ICD-10-CM | POA: Diagnosis not present

## 2022-02-21 DIAGNOSIS — E871 Hypo-osmolality and hyponatremia: Secondary | ICD-10-CM | POA: Diagnosis not present

## 2022-02-21 DIAGNOSIS — I69011 Memory deficit following nontraumatic subarachnoid hemorrhage: Secondary | ICD-10-CM | POA: Diagnosis not present

## 2022-02-24 DIAGNOSIS — I5022 Chronic systolic (congestive) heart failure: Secondary | ICD-10-CM | POA: Diagnosis not present

## 2022-02-24 DIAGNOSIS — I4901 Ventricular fibrillation: Secondary | ICD-10-CM | POA: Diagnosis not present

## 2022-02-24 DIAGNOSIS — I255 Ischemic cardiomyopathy: Secondary | ICD-10-CM | POA: Diagnosis not present

## 2022-02-24 DIAGNOSIS — I69011 Memory deficit following nontraumatic subarachnoid hemorrhage: Secondary | ICD-10-CM | POA: Diagnosis not present

## 2022-02-24 DIAGNOSIS — I110 Hypertensive heart disease with heart failure: Secondary | ICD-10-CM | POA: Diagnosis not present

## 2022-02-24 DIAGNOSIS — E871 Hypo-osmolality and hyponatremia: Secondary | ICD-10-CM | POA: Diagnosis not present

## 2022-02-24 DIAGNOSIS — I2510 Atherosclerotic heart disease of native coronary artery without angina pectoris: Secondary | ICD-10-CM | POA: Diagnosis not present

## 2022-02-24 DIAGNOSIS — I69091 Dysphagia following nontraumatic subarachnoid hemorrhage: Secondary | ICD-10-CM | POA: Diagnosis not present

## 2022-02-24 DIAGNOSIS — I69022 Dysarthria following nontraumatic subarachnoid hemorrhage: Secondary | ICD-10-CM | POA: Diagnosis not present

## 2022-02-25 DIAGNOSIS — I5022 Chronic systolic (congestive) heart failure: Secondary | ICD-10-CM | POA: Diagnosis not present

## 2022-02-25 DIAGNOSIS — I69091 Dysphagia following nontraumatic subarachnoid hemorrhage: Secondary | ICD-10-CM | POA: Diagnosis not present

## 2022-02-25 DIAGNOSIS — I4901 Ventricular fibrillation: Secondary | ICD-10-CM | POA: Diagnosis not present

## 2022-02-25 DIAGNOSIS — I69022 Dysarthria following nontraumatic subarachnoid hemorrhage: Secondary | ICD-10-CM | POA: Diagnosis not present

## 2022-02-25 DIAGNOSIS — I110 Hypertensive heart disease with heart failure: Secondary | ICD-10-CM | POA: Diagnosis not present

## 2022-02-25 DIAGNOSIS — E871 Hypo-osmolality and hyponatremia: Secondary | ICD-10-CM | POA: Diagnosis not present

## 2022-02-25 DIAGNOSIS — I2510 Atherosclerotic heart disease of native coronary artery without angina pectoris: Secondary | ICD-10-CM | POA: Diagnosis not present

## 2022-02-25 DIAGNOSIS — I69011 Memory deficit following nontraumatic subarachnoid hemorrhage: Secondary | ICD-10-CM | POA: Diagnosis not present

## 2022-02-25 DIAGNOSIS — I255 Ischemic cardiomyopathy: Secondary | ICD-10-CM | POA: Diagnosis not present

## 2022-02-26 DIAGNOSIS — I69011 Memory deficit following nontraumatic subarachnoid hemorrhage: Secondary | ICD-10-CM | POA: Diagnosis not present

## 2022-02-26 DIAGNOSIS — E871 Hypo-osmolality and hyponatremia: Secondary | ICD-10-CM | POA: Diagnosis not present

## 2022-02-26 DIAGNOSIS — I2510 Atherosclerotic heart disease of native coronary artery without angina pectoris: Secondary | ICD-10-CM | POA: Diagnosis not present

## 2022-02-26 DIAGNOSIS — I69022 Dysarthria following nontraumatic subarachnoid hemorrhage: Secondary | ICD-10-CM | POA: Diagnosis not present

## 2022-02-26 DIAGNOSIS — I5022 Chronic systolic (congestive) heart failure: Secondary | ICD-10-CM | POA: Diagnosis not present

## 2022-02-26 DIAGNOSIS — I69091 Dysphagia following nontraumatic subarachnoid hemorrhage: Secondary | ICD-10-CM | POA: Diagnosis not present

## 2022-02-26 DIAGNOSIS — I255 Ischemic cardiomyopathy: Secondary | ICD-10-CM | POA: Diagnosis not present

## 2022-02-26 DIAGNOSIS — I110 Hypertensive heart disease with heart failure: Secondary | ICD-10-CM | POA: Diagnosis not present

## 2022-02-26 DIAGNOSIS — I4901 Ventricular fibrillation: Secondary | ICD-10-CM | POA: Diagnosis not present

## 2022-03-03 DIAGNOSIS — I609 Nontraumatic subarachnoid hemorrhage, unspecified: Secondary | ICD-10-CM | POA: Diagnosis not present

## 2022-03-03 DIAGNOSIS — I10 Essential (primary) hypertension: Secondary | ICD-10-CM | POA: Diagnosis not present

## 2022-03-03 DIAGNOSIS — I2510 Atherosclerotic heart disease of native coronary artery without angina pectoris: Secondary | ICD-10-CM | POA: Diagnosis not present

## 2022-03-04 DIAGNOSIS — I69011 Memory deficit following nontraumatic subarachnoid hemorrhage: Secondary | ICD-10-CM | POA: Diagnosis not present

## 2022-03-04 DIAGNOSIS — E871 Hypo-osmolality and hyponatremia: Secondary | ICD-10-CM | POA: Diagnosis not present

## 2022-03-04 DIAGNOSIS — I110 Hypertensive heart disease with heart failure: Secondary | ICD-10-CM | POA: Diagnosis not present

## 2022-03-04 DIAGNOSIS — I5022 Chronic systolic (congestive) heart failure: Secondary | ICD-10-CM | POA: Diagnosis not present

## 2022-03-04 DIAGNOSIS — I4901 Ventricular fibrillation: Secondary | ICD-10-CM | POA: Diagnosis not present

## 2022-03-04 DIAGNOSIS — I69091 Dysphagia following nontraumatic subarachnoid hemorrhage: Secondary | ICD-10-CM | POA: Diagnosis not present

## 2022-03-04 DIAGNOSIS — I2510 Atherosclerotic heart disease of native coronary artery without angina pectoris: Secondary | ICD-10-CM | POA: Diagnosis not present

## 2022-03-04 DIAGNOSIS — I69022 Dysarthria following nontraumatic subarachnoid hemorrhage: Secondary | ICD-10-CM | POA: Diagnosis not present

## 2022-03-04 DIAGNOSIS — I255 Ischemic cardiomyopathy: Secondary | ICD-10-CM | POA: Diagnosis not present

## 2022-03-05 DIAGNOSIS — E871 Hypo-osmolality and hyponatremia: Secondary | ICD-10-CM | POA: Diagnosis not present

## 2022-03-05 DIAGNOSIS — I69022 Dysarthria following nontraumatic subarachnoid hemorrhage: Secondary | ICD-10-CM | POA: Diagnosis not present

## 2022-03-05 DIAGNOSIS — I110 Hypertensive heart disease with heart failure: Secondary | ICD-10-CM | POA: Diagnosis not present

## 2022-03-05 DIAGNOSIS — I255 Ischemic cardiomyopathy: Secondary | ICD-10-CM | POA: Diagnosis not present

## 2022-03-05 DIAGNOSIS — I69011 Memory deficit following nontraumatic subarachnoid hemorrhage: Secondary | ICD-10-CM | POA: Diagnosis not present

## 2022-03-05 DIAGNOSIS — I2510 Atherosclerotic heart disease of native coronary artery without angina pectoris: Secondary | ICD-10-CM | POA: Diagnosis not present

## 2022-03-05 DIAGNOSIS — I4901 Ventricular fibrillation: Secondary | ICD-10-CM | POA: Diagnosis not present

## 2022-03-05 DIAGNOSIS — I69091 Dysphagia following nontraumatic subarachnoid hemorrhage: Secondary | ICD-10-CM | POA: Diagnosis not present

## 2022-03-05 DIAGNOSIS — I5022 Chronic systolic (congestive) heart failure: Secondary | ICD-10-CM | POA: Diagnosis not present

## 2022-03-06 DIAGNOSIS — I4901 Ventricular fibrillation: Secondary | ICD-10-CM | POA: Diagnosis not present

## 2022-03-06 DIAGNOSIS — I2510 Atherosclerotic heart disease of native coronary artery without angina pectoris: Secondary | ICD-10-CM | POA: Diagnosis not present

## 2022-03-06 DIAGNOSIS — I110 Hypertensive heart disease with heart failure: Secondary | ICD-10-CM | POA: Diagnosis not present

## 2022-03-06 DIAGNOSIS — I255 Ischemic cardiomyopathy: Secondary | ICD-10-CM | POA: Diagnosis not present

## 2022-03-06 DIAGNOSIS — I69011 Memory deficit following nontraumatic subarachnoid hemorrhage: Secondary | ICD-10-CM | POA: Diagnosis not present

## 2022-03-06 DIAGNOSIS — I5022 Chronic systolic (congestive) heart failure: Secondary | ICD-10-CM | POA: Diagnosis not present

## 2022-03-06 DIAGNOSIS — I69091 Dysphagia following nontraumatic subarachnoid hemorrhage: Secondary | ICD-10-CM | POA: Diagnosis not present

## 2022-03-06 DIAGNOSIS — E871 Hypo-osmolality and hyponatremia: Secondary | ICD-10-CM | POA: Diagnosis not present

## 2022-03-06 DIAGNOSIS — I69022 Dysarthria following nontraumatic subarachnoid hemorrhage: Secondary | ICD-10-CM | POA: Diagnosis not present

## 2022-03-10 DIAGNOSIS — I69022 Dysarthria following nontraumatic subarachnoid hemorrhage: Secondary | ICD-10-CM | POA: Diagnosis not present

## 2022-03-10 DIAGNOSIS — E871 Hypo-osmolality and hyponatremia: Secondary | ICD-10-CM | POA: Diagnosis not present

## 2022-03-10 DIAGNOSIS — I2510 Atherosclerotic heart disease of native coronary artery without angina pectoris: Secondary | ICD-10-CM | POA: Diagnosis not present

## 2022-03-10 DIAGNOSIS — I255 Ischemic cardiomyopathy: Secondary | ICD-10-CM | POA: Diagnosis not present

## 2022-03-10 DIAGNOSIS — I110 Hypertensive heart disease with heart failure: Secondary | ICD-10-CM | POA: Diagnosis not present

## 2022-03-10 DIAGNOSIS — I4901 Ventricular fibrillation: Secondary | ICD-10-CM | POA: Diagnosis not present

## 2022-03-10 DIAGNOSIS — I69011 Memory deficit following nontraumatic subarachnoid hemorrhage: Secondary | ICD-10-CM | POA: Diagnosis not present

## 2022-03-10 DIAGNOSIS — I5022 Chronic systolic (congestive) heart failure: Secondary | ICD-10-CM | POA: Diagnosis not present

## 2022-03-10 DIAGNOSIS — I69091 Dysphagia following nontraumatic subarachnoid hemorrhage: Secondary | ICD-10-CM | POA: Diagnosis not present

## 2022-03-11 DIAGNOSIS — I5022 Chronic systolic (congestive) heart failure: Secondary | ICD-10-CM | POA: Diagnosis not present

## 2022-03-11 DIAGNOSIS — I110 Hypertensive heart disease with heart failure: Secondary | ICD-10-CM | POA: Diagnosis not present

## 2022-03-11 DIAGNOSIS — I25119 Atherosclerotic heart disease of native coronary artery with unspecified angina pectoris: Secondary | ICD-10-CM | POA: Diagnosis not present

## 2022-03-13 DIAGNOSIS — R410 Disorientation, unspecified: Secondary | ICD-10-CM | POA: Diagnosis not present

## 2022-03-14 ENCOUNTER — Encounter (HOSPITAL_BASED_OUTPATIENT_CLINIC_OR_DEPARTMENT_OTHER): Payer: Self-pay

## 2022-03-14 ENCOUNTER — Other Ambulatory Visit: Payer: Self-pay

## 2022-03-14 ENCOUNTER — Emergency Department (HOSPITAL_BASED_OUTPATIENT_CLINIC_OR_DEPARTMENT_OTHER): Payer: Medicare HMO

## 2022-03-14 ENCOUNTER — Emergency Department (HOSPITAL_BASED_OUTPATIENT_CLINIC_OR_DEPARTMENT_OTHER)
Admission: EM | Admit: 2022-03-14 | Discharge: 2022-03-14 | Disposition: A | Discharge status: Home / Self Care | Payer: Medicare HMO | Attending: Emergency Medicine | Admitting: Emergency Medicine

## 2022-03-14 DIAGNOSIS — I2510 Atherosclerotic heart disease of native coronary artery without angina pectoris: Secondary | ICD-10-CM | POA: Diagnosis not present

## 2022-03-14 DIAGNOSIS — I69022 Dysarthria following nontraumatic subarachnoid hemorrhage: Secondary | ICD-10-CM | POA: Diagnosis not present

## 2022-03-14 DIAGNOSIS — I5022 Chronic systolic (congestive) heart failure: Secondary | ICD-10-CM | POA: Diagnosis not present

## 2022-03-14 DIAGNOSIS — I110 Hypertensive heart disease with heart failure: Secondary | ICD-10-CM | POA: Diagnosis not present

## 2022-03-14 DIAGNOSIS — I69011 Memory deficit following nontraumatic subarachnoid hemorrhage: Secondary | ICD-10-CM | POA: Diagnosis not present

## 2022-03-14 DIAGNOSIS — G913 Post-traumatic hydrocephalus, unspecified: Secondary | ICD-10-CM | POA: Insufficient documentation

## 2022-03-14 DIAGNOSIS — Z7982 Long term (current) use of aspirin: Secondary | ICD-10-CM | POA: Diagnosis not present

## 2022-03-14 DIAGNOSIS — I255 Ischemic cardiomyopathy: Secondary | ICD-10-CM | POA: Diagnosis not present

## 2022-03-14 DIAGNOSIS — I4901 Ventricular fibrillation: Secondary | ICD-10-CM | POA: Diagnosis not present

## 2022-03-14 DIAGNOSIS — E871 Hypo-osmolality and hyponatremia: Secondary | ICD-10-CM | POA: Diagnosis not present

## 2022-03-14 DIAGNOSIS — G919 Hydrocephalus, unspecified: Secondary | ICD-10-CM | POA: Diagnosis not present

## 2022-03-14 DIAGNOSIS — R4182 Altered mental status, unspecified: Secondary | ICD-10-CM | POA: Diagnosis not present

## 2022-03-14 DIAGNOSIS — I69091 Dysphagia following nontraumatic subarachnoid hemorrhage: Secondary | ICD-10-CM | POA: Diagnosis not present

## 2022-03-14 LAB — CBC WITH DIFFERENTIAL/PLATELET
Abs Immature Granulocytes: 0.02 10*3/uL (ref 0.00–0.07)
Basophils Absolute: 0.1 10*3/uL (ref 0.0–0.1)
Basophils Relative: 1 %
Eosinophils Absolute: 0.1 10*3/uL (ref 0.0–0.5)
Eosinophils Relative: 1 %
HCT: 42.8 % (ref 39.0–52.0)
Hemoglobin: 14.8 g/dL (ref 13.0–17.0)
Immature Granulocytes: 0 %
Lymphocytes Relative: 39 %
Lymphs Abs: 3.5 10*3/uL (ref 0.7–4.0)
MCH: 31.6 pg (ref 26.0–34.0)
MCHC: 34.6 g/dL (ref 30.0–36.0)
MCV: 91.5 fL (ref 80.0–100.0)
Monocytes Absolute: 0.6 10*3/uL (ref 0.1–1.0)
Monocytes Relative: 7 %
Neutro Abs: 4.7 10*3/uL (ref 1.7–7.7)
Neutrophils Relative %: 52 %
Platelets: 223 10*3/uL (ref 150–400)
RBC: 4.68 MIL/uL (ref 4.22–5.81)
RDW: 12.8 % (ref 11.5–15.5)
WBC: 9 10*3/uL (ref 4.0–10.5)
nRBC: 0 % (ref 0.0–0.2)

## 2022-03-14 LAB — BASIC METABOLIC PANEL
Anion gap: 10 (ref 5–15)
BUN: 16 mg/dL (ref 8–23)
CO2: 26 mmol/L (ref 22–32)
Calcium: 9.8 mg/dL (ref 8.9–10.3)
Chloride: 106 mmol/L (ref 98–111)
Creatinine, Ser: 0.79 mg/dL (ref 0.61–1.24)
GFR, Estimated: >60 mL/min (ref >60)
Glucose, Bld: 95 mg/dL (ref 70–99)
Potassium: 3.8 mmol/L (ref 3.5–5.1)
Sodium: 142 mmol/L (ref 135–145)

## 2022-03-14 NOTE — ED Notes (Signed)
Pt given discharge instructions. Opportunities given for questions. Pt verbalizes understanding. PIV removed x1. Leanne Chang, RN

## 2022-03-14 NOTE — ED Provider Notes (Signed)
[place] Provider Note   CSN: [id number] Arrival date & time: 03/14/22  1410     History  Chief Complaint  Patient presents with   Altered Mental Status    Joe [name] is a 67 y.o. male.  HPI     68 year old male comes in with chief complaint of altered mental status.  Patient accompanied by his son.  Patient has history of subarachnoid hemorrhage secondary to brain aneurysm that was coiled in January.  According to the patient's son, patient had some memory issues at the time of discharge.  However over the last 3 weeks he has made consistent improvements.  Patient is now in fact living independently.  This last week however they have noticed regression.  Patient is having hallucinations and has also made comments that do not make sense.  They think patient is having difficulty with memory and difficulty with tasks such as taking his medications on time or taking the right medications.  Patient denies any headache, neck pain, one-sided weakness or numbness, vision change.  He is not on any psychiatric medications.  He to thinks that perhaps he is not at his 100%.  Patient has no new medications, they did go up on losartan at the time of discharge.  Patient has had subsequent follow-up with neurosurgery, but at that time he was progressing and there were no concerns.  Home Medications Prior to Admission medications   Medication Sig Start Date End Date Taking? Authorizing Provider  acetaminophen (TYLENOL) 325 MG tablet Take 2 tablets (650 mg total) by mouth every 6 (six) hours as needed for mild pain (or temp > 37.5 C (99.5 F)). 02/07/22   Ashok Pall, MD  aspirin 81 MG tablet Take 81 mg by mouth daily.      [provider]  carvedilol (COREG) 12.5 MG tablet Take 1 tablet (12.5 mg total) by mouth 2 (two) times daily with a meal. 11/12/21   Baldwin Jamaica, PA-C  diphenhydrAMINE-PE-APAP ([place] FAST-MAX COLD FLU NGHT)  12.5-5-325 MG/10ML LIQD Take 10 mLs by mouth every 6 (six) hours as needed (cold symptoms).    [provider]  Evolocumab (REPATHA SURECLICK) [id number] MG/ML SOAJ Inject 1 pen into the skin every 14 (fourteen) days. 01/31/21   Evans Lance, MD  fenofibrate 160 MG tablet Take 1 tablet (160 mg total) by mouth daily. 11/12/21   Baldwin [place], PA-C  fish oil-omega-3 fatty acids 1000 MG capsule Take 4 g by mouth daily.     [provider]  losartan (COZAAR) 50 MG tablet TAKE 1 TABLET(50 MG) BY MOUTH DAILY 11/12/21   Baldwin [place], PA-C  nitroGLYCERIN (NITROSTAT) 0.4 MG SL tablet Place 1 tablet (0.4 mg total) under the tongue every 5 (five) minutes as needed for chest pain. Up to 3 doses 11/12/21   Baldwin [place], PA-C  sodium chloride 1 g tablet Take 1 tablet (1 g total) by mouth 3 (three) times daily with meals. 02/07/22   Ashok Pall, MD      Allergies    Isosorbide dinitrate, Rosuvastatin, and Atorvastatin    Review of Systems   Review of Systems  All other systems reviewed and are negative.   Physical Exam Updated Vital Signs BP (!) 153/95   Pulse (!) 55   Temp (!) 97.5 F (36.4 C)   Resp 18   Ht '6\' 3"'$  (1.905 m)   Wt 103.9 kg   SpO2 96%   BMI  28.62 kg/m  Physical Exam Vitals and nursing note reviewed.  Constitutional:      Appearance: He is well-developed.  HENT:     Head: Atraumatic.  Eyes:     Extraocular Movements: Extraocular movements intact.     Pupils: Pupils are equal, round, and reactive to light.  Cardiovascular:     Rate and Rhythm: Normal rate.  Pulmonary:     Effort: Pulmonary effort is normal.  Musculoskeletal:     Cervical back: Neck supple.  Skin:    General: Skin is warm.  Neurological:     Mental Status: He is alert and oriented to person, place, and time.     Cranial Nerves: No cranial nerve deficit.     Sensory: No sensory deficit.     Motor: No weakness.     Coordination: Coordination normal.     ED Results /  Procedures / Treatments   Labs (all labs ordered are listed, but only abnormal results are displayed) Labs Reviewed  BASIC METABOLIC PANEL  CBC WITH DIFFERENTIAL/PLATELET    EKG None  Radiology CT Head Wo Contrast  Result Date: 03/14/2022 CLINICAL DATA:  Altered mental status. History of aneurysm repair on 01/26/2022. EXAM: CT HEAD WITHOUT CONTRAST TECHNIQUE: Contiguous axial images were obtained from the base of the skull through the vertex without intravenous contrast. RADIATION DOSE REDUCTION: This exam was performed according to the departmental dose-optimization program which includes automated exposure control, adjustment of the mA and/or kV according to patient size and/or use of iterative reconstruction technique. COMPARISON:  CT head 01/26/22 FINDINGS: Brain: New severe hydrocephalus involving the bilateral lateral ventricles, third ventricle, and the fourth ventricle. There is periventricular hypodensity which is worrisome for transependymal flow of CSF. There is diffuse effacement of the cerebral sulci, worrisome for increased intracranial pressure. There are postprocedural changes from a prior basilar tip aneurysm repair. No CT evidence of new hemorrhage. No extra-axial fluid collection. There is small focal hypodensity involving the left occipital lobe cortex, which could represent an acute infarct Vascular: Postsurgical changes from a prior basilar tip aneurysm repair. No other hyperdense vessel is visualized. Skull: Normal. Negative for fracture or focal lesion. Sinuses/Orbits: Trace bilateral mastoid effusions. Left maxillary sinus mucous retention cyst. Orbits are unremarkable. Other: None. IMPRESSION: 1. 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 Findings may be secondary to reduced resorption of CSF given history of subarachnoid hemorrhage. 2. Small focal hypodensity involving the left occipital lobe cortex, which could represent an acute infarct. 3.  Postprocedural changes from a prior basilar tip aneurysm repair. No CT evidence of new hemorrhage. Electronically Signed   By: Marin Roberts M.D.   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 Radiology: ordered.   67 year old male comes in with chief complaint of altered mental status.  Patient has history of subarachnoid hemorrhage secondary to brain aneurysm that was coiled in January.  Subsequently he made significant recovery and was doing well until this week when family has noted increased events of confusion, forgetfulness, speaking about events or people that do not make sense  and perhaps hallucinations.  Patient has no headaches.  He has no focal neurodeficits.  Differential diagnosis includes electrolyte abnormality, dehydration, UTI, subacute brain bleed, hydrocephalus.  Patient denies any headaches and has no focal neurodeficits, which is reassuring.  CT scan of the brain along with basic labs have been ordered.  Reassessment at 4:10 PM I have independently interpreted patient's CT scan of the brain.  He does not have any acute brain bleed.  Radiologist called me and made me aware that there is evidence of hydrocephalus, but this is most likely sequelae of his initial insult.  However, hydrocephalus can present insidiously and can have primary complaints related to cognition, memory -so we will touch base with our neurosurgery team to see if patient needs close follow-up or not.  I do not think that patient needs any emergent intervention.  Reassessment at 5 PM: I discussed the case with Dr. Kathyrn Sheriff.  I discussed with him that patient is having  some episodes of confusion, forgetfulness and then also hallucinations.  CT scan findings concerning for hydrocephalus, but it is unclear if this is sequelae of the initial trauma or evolving into something getting worse.  Dr. Kathyrn Sheriff would be happy to see the patient on Monday in his clinic.  I informed Dr. Kathyrn Sheriff that patient is not in any extremis and denying any severe headache, neck pain, fevers, chills, balance issues.  I discussed the findings with the patient's son and also daughter, who is Immunologist.  They are comfortable with close follow-up plan as well.  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

## 2022-03-14 NOTE — Discharge Instructions (Addendum)
Please write down the changes you have noticed. Memory issue, cognition issues, balance issues, headaches, confusion etc. Please bring Joe [name] to the ER immediately if there is increased confusion, vomiting, fevers, severe headaches.   Dr. Kathyrn Sheriff would like to see you on Monday for an appointment, his secretary will call you with an appointment. Call them if you dont hear by 10 am.

## 2022-03-14 NOTE — ED Triage Notes (Signed)
Had aneurysm repair 01/26/22   Return home with confusion which has been resolving but this past week began becoming more confused.  States becoming more forgetful.  Ambulatory to triage without difficulty.

## 2022-03-17 ENCOUNTER — Ambulatory Visit (INDEPENDENT_AMBULATORY_CARE_PROVIDER_SITE_OTHER): Payer: Medicare HMO

## 2022-03-17 DIAGNOSIS — G910 Communicating hydrocephalus: Secondary | ICD-10-CM | POA: Diagnosis not present

## 2022-03-17 DIAGNOSIS — I255 Ischemic cardiomyopathy: Secondary | ICD-10-CM | POA: Diagnosis not present

## 2022-03-17 DIAGNOSIS — I609 Nontraumatic subarachnoid hemorrhage, unspecified: Secondary | ICD-10-CM | POA: Diagnosis not present

## 2022-03-18 LAB — CUP PACEART REMOTE DEVICE CHECK
Battery Remaining Longevity: 4 mo
Battery Remaining Percentage: 3 %
Battery Voltage: 2.62 V
Brady Statistic RV Percent Paced: 1 %
Date Time Interrogation Session: 20240304020403
HighPow Impedance: 66 Ohm
HighPow Impedance: 66 Ohm
Implantable Lead Connection Status: 753985
Implantable Lead Implant Date: 20121227
Implantable Lead Location: 753860
Implantable Lead Model: 7122
Implantable Pulse Generator Implant Date: 20121227
Lead Channel Impedance Value: 430 Ohm
Lead Channel Pacing Threshold Amplitude: 0.75 V
Lead Channel Pacing Threshold Pulse Width: 0.5 ms
Lead Channel Sensing Intrinsic Amplitude: 11.8 mV
Lead Channel Setting Pacing Amplitude: 2.5 V
Lead Channel Setting Pacing Pulse Width: 0.5 ms
Lead Channel Setting Sensing Sensitivity: 0.5 mV
Pulse Gen Serial Number: 1037513
Zone Setting Status: 755011

## 2022-03-19 DIAGNOSIS — I2510 Atherosclerotic heart disease of native coronary artery without angina pectoris: Secondary | ICD-10-CM | POA: Diagnosis not present

## 2022-03-19 DIAGNOSIS — I255 Ischemic cardiomyopathy: Secondary | ICD-10-CM | POA: Diagnosis not present

## 2022-03-19 DIAGNOSIS — I110 Hypertensive heart disease with heart failure: Secondary | ICD-10-CM | POA: Diagnosis not present

## 2022-03-19 DIAGNOSIS — I69091 Dysphagia following nontraumatic subarachnoid hemorrhage: Secondary | ICD-10-CM | POA: Diagnosis not present

## 2022-03-19 DIAGNOSIS — E871 Hypo-osmolality and hyponatremia: Secondary | ICD-10-CM | POA: Diagnosis not present

## 2022-03-19 DIAGNOSIS — I69011 Memory deficit following nontraumatic subarachnoid hemorrhage: Secondary | ICD-10-CM | POA: Diagnosis not present

## 2022-03-19 DIAGNOSIS — I4901 Ventricular fibrillation: Secondary | ICD-10-CM | POA: Diagnosis not present

## 2022-03-19 DIAGNOSIS — I5022 Chronic systolic (congestive) heart failure: Secondary | ICD-10-CM | POA: Diagnosis not present

## 2022-03-19 DIAGNOSIS — I69022 Dysarthria following nontraumatic subarachnoid hemorrhage: Secondary | ICD-10-CM | POA: Diagnosis not present

## 2022-03-20 DIAGNOSIS — I5022 Chronic systolic (congestive) heart failure: Secondary | ICD-10-CM | POA: Diagnosis not present

## 2022-03-20 DIAGNOSIS — I69011 Memory deficit following nontraumatic subarachnoid hemorrhage: Secondary | ICD-10-CM | POA: Diagnosis not present

## 2022-03-20 DIAGNOSIS — I110 Hypertensive heart disease with heart failure: Secondary | ICD-10-CM | POA: Diagnosis not present

## 2022-03-20 DIAGNOSIS — I255 Ischemic cardiomyopathy: Secondary | ICD-10-CM | POA: Diagnosis not present

## 2022-03-20 DIAGNOSIS — I69022 Dysarthria following nontraumatic subarachnoid hemorrhage: Secondary | ICD-10-CM | POA: Diagnosis not present

## 2022-03-20 DIAGNOSIS — I69091 Dysphagia following nontraumatic subarachnoid hemorrhage: Secondary | ICD-10-CM | POA: Diagnosis not present

## 2022-03-20 DIAGNOSIS — I2510 Atherosclerotic heart disease of native coronary artery without angina pectoris: Secondary | ICD-10-CM | POA: Diagnosis not present

## 2022-03-20 DIAGNOSIS — I4901 Ventricular fibrillation: Secondary | ICD-10-CM | POA: Diagnosis not present

## 2022-03-20 DIAGNOSIS — E871 Hypo-osmolality and hyponatremia: Secondary | ICD-10-CM | POA: Diagnosis not present

## 2022-03-21 ENCOUNTER — Other Ambulatory Visit: Payer: Self-pay | Admitting: Neurosurgery

## 2022-03-21 DIAGNOSIS — I5022 Chronic systolic (congestive) heart failure: Secondary | ICD-10-CM | POA: Diagnosis not present

## 2022-03-21 DIAGNOSIS — I110 Hypertensive heart disease with heart failure: Secondary | ICD-10-CM | POA: Diagnosis not present

## 2022-03-21 DIAGNOSIS — G910 Communicating hydrocephalus: Secondary | ICD-10-CM

## 2022-03-21 DIAGNOSIS — I69022 Dysarthria following nontraumatic subarachnoid hemorrhage: Secondary | ICD-10-CM | POA: Diagnosis not present

## 2022-03-21 DIAGNOSIS — I69011 Memory deficit following nontraumatic subarachnoid hemorrhage: Secondary | ICD-10-CM | POA: Diagnosis not present

## 2022-03-21 DIAGNOSIS — I4901 Ventricular fibrillation: Secondary | ICD-10-CM | POA: Diagnosis not present

## 2022-03-21 DIAGNOSIS — I2510 Atherosclerotic heart disease of native coronary artery without angina pectoris: Secondary | ICD-10-CM | POA: Diagnosis not present

## 2022-03-21 DIAGNOSIS — E871 Hypo-osmolality and hyponatremia: Secondary | ICD-10-CM | POA: Diagnosis not present

## 2022-03-21 DIAGNOSIS — I69091 Dysphagia following nontraumatic subarachnoid hemorrhage: Secondary | ICD-10-CM | POA: Diagnosis not present

## 2022-03-21 DIAGNOSIS — I255 Ischemic cardiomyopathy: Secondary | ICD-10-CM | POA: Diagnosis not present

## 2022-03-24 ENCOUNTER — Ambulatory Visit
Admission: RE | Admit: 2022-03-24 | Discharge: 2022-03-24 | Disposition: A | Discharge status: Home / Self Care | Payer: Medicare HMO | Source: Ambulatory Visit | Attending: Neurosurgery | Admitting: Neurosurgery

## 2022-03-24 DIAGNOSIS — R413 Other amnesia: Secondary | ICD-10-CM | POA: Diagnosis not present

## 2022-03-24 DIAGNOSIS — G910 Communicating hydrocephalus: Secondary | ICD-10-CM

## 2022-03-24 NOTE — Discharge Instructions (Signed)

## 2022-04-01 DIAGNOSIS — I4901 Ventricular fibrillation: Secondary | ICD-10-CM | POA: Diagnosis not present

## 2022-04-01 DIAGNOSIS — I5022 Chronic systolic (congestive) heart failure: Secondary | ICD-10-CM | POA: Diagnosis not present

## 2022-04-01 DIAGNOSIS — I110 Hypertensive heart disease with heart failure: Secondary | ICD-10-CM | POA: Diagnosis not present

## 2022-04-01 DIAGNOSIS — I2510 Atherosclerotic heart disease of native coronary artery without angina pectoris: Secondary | ICD-10-CM | POA: Diagnosis not present

## 2022-04-01 DIAGNOSIS — E871 Hypo-osmolality and hyponatremia: Secondary | ICD-10-CM | POA: Diagnosis not present

## 2022-04-01 DIAGNOSIS — I69091 Dysphagia following nontraumatic subarachnoid hemorrhage: Secondary | ICD-10-CM | POA: Diagnosis not present

## 2022-04-01 DIAGNOSIS — I69022 Dysarthria following nontraumatic subarachnoid hemorrhage: Secondary | ICD-10-CM | POA: Diagnosis not present

## 2022-04-01 DIAGNOSIS — I69011 Memory deficit following nontraumatic subarachnoid hemorrhage: Secondary | ICD-10-CM | POA: Diagnosis not present

## 2022-04-01 DIAGNOSIS — I255 Ischemic cardiomyopathy: Secondary | ICD-10-CM | POA: Diagnosis not present

## 2022-04-04 ENCOUNTER — Other Ambulatory Visit: Payer: Self-pay | Admitting: Neurosurgery

## 2022-04-07 DIAGNOSIS — E871 Hypo-osmolality and hyponatremia: Secondary | ICD-10-CM | POA: Diagnosis not present

## 2022-04-07 DIAGNOSIS — I110 Hypertensive heart disease with heart failure: Secondary | ICD-10-CM | POA: Diagnosis not present

## 2022-04-07 DIAGNOSIS — I255 Ischemic cardiomyopathy: Secondary | ICD-10-CM | POA: Diagnosis not present

## 2022-04-07 DIAGNOSIS — I4901 Ventricular fibrillation: Secondary | ICD-10-CM | POA: Diagnosis not present

## 2022-04-07 DIAGNOSIS — I69022 Dysarthria following nontraumatic subarachnoid hemorrhage: Secondary | ICD-10-CM | POA: Diagnosis not present

## 2022-04-07 DIAGNOSIS — I69091 Dysphagia following nontraumatic subarachnoid hemorrhage: Secondary | ICD-10-CM | POA: Diagnosis not present

## 2022-04-07 DIAGNOSIS — I5022 Chronic systolic (congestive) heart failure: Secondary | ICD-10-CM | POA: Diagnosis not present

## 2022-04-07 DIAGNOSIS — I2510 Atherosclerotic heart disease of native coronary artery without angina pectoris: Secondary | ICD-10-CM | POA: Diagnosis not present

## 2022-04-07 DIAGNOSIS — I69011 Memory deficit following nontraumatic subarachnoid hemorrhage: Secondary | ICD-10-CM | POA: Diagnosis not present

## 2022-04-08 ENCOUNTER — Encounter: Payer: Self-pay | Admitting: Internal Medicine

## 2022-04-08 ENCOUNTER — Other Ambulatory Visit: Payer: Self-pay | Admitting: Neurosurgery

## 2022-04-08 NOTE — Progress Notes (Signed)
PERIOPERATIVE PRESCRIPTION FOR IMPLANTED CARDIAC DEVICE PROGRAMMING  Patient Information: Name:  Joe [name]  DOB:  21-Aug-1955  MRN:  [id number]  Planned Procedure:  laparoscopic VP shunt placement  Surgeon:  Kathyrn Sheriff  Date of Procedure:  04/15/22  Cautery will be used.  Position during surgery:  supine   Device Information:  Clinic EP Physician:  Cristopher Peru, MD   Device Type:  Defibrillator Manufacturer and Phone #:  St. Jude/Abbott: [phone] Pacemaker Dependent?:  No. Date of Last Device Check:  03/17/2022 Normal Device Function?:  Yes.    Electrophysiologist's Recommendations:  Have magnet available. Provide continuous ECG monitoring when magnet is used or reprogramming is to be performed.  Procedure may interfere with device function.  Magnet should be placed over device during procedure.  Per Device Clinic Standing Orders, Damian Leavell, RN  1:30 PM 04/08/2022

## 2022-04-08 NOTE — Progress Notes (Signed)
Rep notified and [place] faxed for instructions.

## 2022-04-08 NOTE — Progress Notes (Signed)
Surgical Instructions    Your procedure is scheduled on Tuesday, 04/15/22.  Report to [facility] Main Entrance "A" at 11:00 A.M., then check in with the Admitting office.  Call this number if you have problems the morning of surgery:  [phone]   If you have any questions prior to your surgery date call [phone]: Open Monday-Friday 8am-4pm If you experience any cold or flu symptoms such as cough, fever, chills, shortness of breath, etc. between now and your scheduled surgery, please notify us at the above number     Remember:  Do not eat after midnight the night before your surgery  You may drink clear liquids until 10:00am the morning of your surgery.   Clear liquids allowed are: Water, Non-Citrus Juices (without pulp), Carbonated Beverages, Clear Tea, Black Coffee ONLY (NO MILK, CREAM OR POWDERED CREAMER of any kind), and Gatorade    Take these medicines the morning of surgery with A SIP OF WATER:  carvedilol (COREG)    As of today, STOP taking any Aspirin (unless otherwise instructed by your surgeon) Aleve, Naproxen, Ibuprofen, Motrin, Advil, Goody's, BC's, all herbal medications, fish oil, and all vitamins.           Do not wear jewelry or makeup. Do not wear lotions, powders, cologne or deodorant. Men may shave face and neck. Do not bring valuables to the hospital. Do not wear nail polish, gel polish, artificial nails, or any other type of covering on natural nails (fingers and toes) If you have artificial nails or gel coating that need to be removed by a nail salon, please have this removed prior to surgery. Artificial nails or gel coating may interfere with anesthesia's ability to adequately monitor your vital signs.  [facility] is not responsible for any belongings or valuables.    Do NOT Smoke (Tobacco/Vaping)  24 hours prior to your procedure  If you use a CPAP at night, you may bring your mask for your overnight stay.   Contacts, glasses, hearing aids, dentures  or partials may not be worn into surgery, please bring cases for these belongings   For patients admitted to the hospital, discharge time will be determined by your treatment team.   Patients discharged the day of surgery will not be allowed to drive home, and someone needs to stay with them for 24 hours.   SURGICAL WAITING ROOM VISITATION Patients having surgery or a procedure may have no more than 2 support people in the waiting area - these visitors may rotate.   Children under the age of 90 must have an adult with them who is not the patient. If the patient needs to stay at the hospital during part of their recovery, the visitor guidelines for inpatient rooms apply. Pre-op nurse will coordinate an appropriate time for 1 support person to accompany patient in pre-op.  This support person may not rotate.   Please refer to [email or web address] for the visitor guidelines for Inpatients (after your surgery is over and you are in a regular room).    Special instructions:    Oral Hygiene is also important to reduce your risk of infection.  Remember - BRUSH YOUR TEETH THE MORNING OF SURGERY WITH YOUR REGULAR TOOTHPASTE   [place]- Preparing For Surgery  Before surgery, you can play an important role. Because skin is not sterile, your skin needs to be as free of germs as possible. You can reduce the number of germs on your skin by washing with CHG (chlorahexidine gluconate) Soap  before surgery.  CHG is an antiseptic cleaner which kills germs and bonds with the skin to continue killing germs even after washing.     Please do not use if you have an allergy to CHG or antibacterial soaps. If your skin becomes reddened/irritated stop using the CHG.  Do not shave (including legs and underarms) for at least 48 hours prior to first CHG shower. It is OK to shave your face.  Please follow these instructions carefully.     Shower the NIGHT BEFORE  SURGERY and the MORNING OF SURGERY with CHG Soap.   If you chose to wash your hair, wash your hair first as usual with your normal shampoo. After you shampoo, rinse your hair and body thoroughly to remove the shampoo.  Then [employer] and genitals (private parts) with your normal soap and rinse thoroughly to remove soap.  After that Use CHG Soap as you would any other liquid soap. You can apply CHG directly to the skin and wash gently with a scrungie or a clean washcloth.   Apply the CHG Soap to your body ONLY FROM THE NECK DOWN.  Do not use on open wounds or open sores. Avoid contact with your eyes, ears, mouth and genitals (private parts). Wash Face and genitals (private parts)  with your normal soap.   Wash thoroughly, paying special attention to the area where your surgery will be performed.  Thoroughly rinse your body with warm water from the neck down.  DO NOT shower/wash with your normal soap after using and rinsing off the CHG Soap.  Pat yourself dry with a CLEAN TOWEL.  Wear CLEAN PAJAMAS to bed the night before surgery  Place CLEAN SHEETS on your bed the night before your surgery  DO NOT SLEEP WITH PETS.   Day of Surgery: Take a shower with CHG soap. Wear Clean/Comfortable clothing the morning of surgery Do not apply any deodorants/lotions.   Remember to brush your teeth WITH YOUR REGULAR TOOTHPASTE.    If you received a COVID test during your pre-op visit, it is requested that you wear a mask when out in public, stay away from anyone that may not be feeling well, and notify your surgeon if you develop symptoms. If you have been in contact with anyone that has tested positive in the last 10 days, please notify your surgeon.    Please read over the following fact sheets that you were given.

## 2022-04-09 ENCOUNTER — Other Ambulatory Visit: Payer: Self-pay | Admitting: Neurosurgery

## 2022-04-09 ENCOUNTER — Other Ambulatory Visit: Payer: Self-pay

## 2022-04-09 ENCOUNTER — Encounter (HOSPITAL_COMMUNITY)
Admission: RE | Admit: 2022-04-09 | Discharge: 2022-04-09 | Disposition: A | Discharge status: Home / Self Care | Payer: Medicare HMO | Source: Ambulatory Visit | Attending: Neurosurgery | Admitting: Neurosurgery

## 2022-04-09 ENCOUNTER — Encounter (HOSPITAL_COMMUNITY): Payer: Self-pay

## 2022-04-09 ENCOUNTER — Ambulatory Visit
Admission: RE | Admit: 2022-04-09 | Discharge: 2022-04-09 | Disposition: A | Discharge status: Home / Self Care | Payer: Medicare HMO | Source: Ambulatory Visit | Attending: Neurosurgery | Admitting: Neurosurgery

## 2022-04-09 VITALS — BP 156/79 | HR 62 | Temp 97.5°F | Resp 18 | Ht 74.0 in | Wt 229.6 lb

## 2022-04-09 DIAGNOSIS — Z95810 Presence of automatic (implantable) cardiac defibrillator: Secondary | ICD-10-CM | POA: Insufficient documentation

## 2022-04-09 DIAGNOSIS — Z01812 Encounter for preprocedural laboratory examination: Secondary | ICD-10-CM | POA: Diagnosis not present

## 2022-04-09 DIAGNOSIS — G910 Communicating hydrocephalus: Secondary | ICD-10-CM | POA: Diagnosis not present

## 2022-04-09 HISTORY — DX: Pneumonia, unspecified organism: J18.9

## 2022-04-09 HISTORY — DX: Acute myocardial infarction, unspecified: I21.9

## 2022-04-09 HISTORY — DX: Personal history of urinary calculi: Z87.442

## 2022-04-09 HISTORY — DX: Presence of cardiac pacemaker: Z95.0

## 2022-04-09 HISTORY — DX: Anemia, unspecified: D64.9

## 2022-04-09 HISTORY — DX: Essential (primary) hypertension: I10

## 2022-04-09 LAB — CBC
HCT: 43.3 % (ref 39.0–52.0)
Hemoglobin: 14.9 g/dL (ref 13.0–17.0)
MCH: 31.8 pg (ref 26.0–34.0)
MCHC: 34.4 g/dL (ref 30.0–36.0)
MCV: 92.5 fL (ref 80.0–100.0)
Platelets: 232 10*3/uL (ref 150–400)
RBC: 4.68 MIL/uL (ref 4.22–5.81)
RDW: 12.2 % (ref 11.5–15.5)
WBC: 8.2 10*3/uL (ref 4.0–10.5)
nRBC: 0 % (ref 0.0–0.2)

## 2022-04-09 LAB — BASIC METABOLIC PANEL
Anion gap: 12 (ref 5–15)
BUN: 17 mg/dL (ref 8–23)
CO2: 25 mmol/L (ref 22–32)
Calcium: 9.6 mg/dL (ref 8.9–10.3)
Chloride: 106 mmol/L (ref 98–111)
Creatinine, Ser: 1 mg/dL (ref 0.61–1.24)
GFR, Estimated: >60 mL/min (ref >60)
Glucose, Bld: 93 mg/dL (ref 70–99)
Potassium: 3.7 mmol/L (ref 3.5–5.1)
Sodium: 143 mmol/L (ref 135–145)

## 2022-04-09 NOTE — Progress Notes (Signed)
PCP - [facility] Cardiologist - Cristopher Peru and Baldwin [place], PA-C Fransico Him)  PPM/ICD - yes Device Orders - received and placed in chart Rep Notified - yes, will be present DOS if needed  Chest x-ray - n/a EKG - 02/05/22  Stress Test - 11/27/11 ECHO - 05/08/15 Cardiac Cath - 2011   Sleep Study - +OSA CPAP - does not wear  ERAS Protcol -yes PRE-SURGERY Ensure or G2- none ordered  COVID TEST- not needed   Anesthesia review: yes, review for need of cardiac clearance. Patient has pacemaker being managed by gregg taylor  Patient denies shortness of breath, fever, cough and chest pain at PAT appointment   All instructions explained to the patient, with a verbal understanding of the material. Patient agrees to go over the instructions while at home for a better understanding. Patient also instructed to self quarantine after being tested for COVID-19. The opportunity to ask questions was provided.

## 2022-04-09 NOTE — Discharge Instructions (Signed)

## 2022-04-10 ENCOUNTER — Encounter (HOSPITAL_COMMUNITY): Payer: Self-pay | Admitting: Certified Registered"

## 2022-04-10 ENCOUNTER — Encounter (HOSPITAL_COMMUNITY): Payer: Self-pay | Admitting: Physician Assistant

## 2022-04-10 NOTE — Progress Notes (Signed)
Anesthesia Chart Review:  Follows with cardiology for history of ICM s/p St Jude ICD 2012, HFrEF, HTN, CAD s/p PCI to LAD [id number] (complicated by VF arrest).  Last echo 04/2015 showed EF 30 to 35%.  Last seen in EP clinic 11/12/2021.  ICD noted to be nearing ERI, intact function, no programming changes made.  Current medications continue, 42-month follow-up recommended.  Last remote device check 03/17/2022 showed "histograms appropriate.  Leads and battery stable for patient.  Follow-up as outlined above.  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 Kathyrn Sheriff on 01/13/2022.  Patient subsequently seen in the ED on 03/14/2022 with complaint of altered mental status.  CT did not show any acute brain bleed, there was evidence of hydrocephalus.  ED provider discussed case with Dr. Kathyrn Sheriff who recommended outpatient follow-up.  Patient has subsequently undergone 2 lumbar punctures by Dr. Kathyrn Sheriff.  OSA, not on CPAP.  Preop labs reviewed, WNL.  Perioperative prescription for implanted cardiac device programming per progress note 04/08/2022: Device Information:   Clinic EP Physician:  Cristopher Peru, MD    Device Type:  Defibrillator Manufacturer and Phone #:  St. Jude/Abbott: [phone] Pacemaker Dependent?:  No. Date of Last Device Check:  03/17/2022         Normal Device Function?:  Yes.     Electrophysiologist's Recommendations:   Have magnet available. Provide continuous ECG monitoring when magnet is used or reprogramming is to be performed.  Procedure may interfere with device function.  Magnet should be placed over device during procedure.  TTE 05/08/2015: - Left ventricle: The cavity size was mildly dilated. Wall    thickness was normal. Systolic function was moderately to    severely reduced. The estimated ejection fraction was in the    range of 30% to 35%. Anteroseptal akinesis, mid to apical     inferoseptal akinesis, apical anterior akinesis, apical inferior    akinesis, akinesis of the true apex. Doppler parameters are    consistent with abnormal left ventricular relaxation (grade 1    diastolic dysfunction).  - Aortic valve: There was no stenosis.  - Aorta: Mildly dilated aortic root. Aortic root dimension: 38 mm    (ED).  - Mitral valve: There was trivial regurgitation.  - Left atrium: The atrium was mildly dilated.  - Right ventricle: The cavity size was normal. Pacer wire or    catheter noted in right ventricle. Systolic function was normal.  - Tricuspid valve: Peak RV-RA gradient (S): 23 mm Hg.  - Pulmonary arteries: PA peak pressure: 31 mm Hg (S).  - Systemic veins: IVC measured 2.4 cm with normal respirophasic    variation, suggesting RA pressure 8 mmHg.   Impressions:   - Mildly dilated LV. EF 30-35%. Wall motion abnormalities as noted    above in the distribution of LAD infarction. Normal RV size and    systolic function. No significant valvular abnormalities.     Wynonia Musty [facility] Short Stay Center/Anesthesiology Phone [phone] 04/10/2022 1:26 PM

## 2022-04-15 ENCOUNTER — Ambulatory Visit (HOSPITAL_COMMUNITY)
Admission: RE | Admit: 2022-04-15 | Discharge: 2022-04-15 | Disposition: A | Discharge status: Home / Self Care | Payer: Medicare HMO | Attending: Neurosurgery | Admitting: Neurosurgery

## 2022-04-15 ENCOUNTER — Other Ambulatory Visit: Payer: Self-pay

## 2022-04-15 ENCOUNTER — Encounter (HOSPITAL_COMMUNITY): Payer: Self-pay | Admitting: Neurosurgery

## 2022-04-15 ENCOUNTER — Encounter (HOSPITAL_COMMUNITY)
Admission: RE | Disposition: A | Discharge status: Home / Self Care | Payer: Self-pay | Source: Home / Self Care | Attending: Neurosurgery

## 2022-04-15 DIAGNOSIS — I609 Nontraumatic subarachnoid hemorrhage, unspecified: Secondary | ICD-10-CM | POA: Diagnosis not present

## 2022-04-15 DIAGNOSIS — Z7982 Long term (current) use of aspirin: Secondary | ICD-10-CM | POA: Insufficient documentation

## 2022-04-15 SURGERY — SHUNT INSERTION VENTRICULAR-PERITONEAL
Anesthesia: General

## 2022-04-15 MED ORDER — CHLORHEXIDINE GLUCONATE CLOTH 2 % EX PADS: 6.0000 | MEDICATED_PAD | Freq: Once | CUTANEOUS | Status: DC

## 2022-04-15 MED ORDER — LIDOCAINE 2% (20 MG/ML) 5 ML SYRINGE
INTRAMUSCULAR | Status: AC
  Filled 2022-04-15: qty 5

## 2022-04-15 MED ORDER — EPHEDRINE 5 MG/ML INJ
INTRAVENOUS | Status: AC
  Filled 2022-04-15: qty 5

## 2022-04-15 MED ORDER — SODIUM CHLORIDE 0.9 % IV SOLN: INTRAVENOUS | Status: DC

## 2022-04-15 MED ORDER — ONDANSETRON HCL 4 MG/2ML IJ SOLN
INTRAMUSCULAR | Status: AC
  Filled 2022-04-15: qty 2

## 2022-04-15 MED ORDER — FENTANYL CITRATE (PF) 250 MCG/5ML IJ SOLN
INTRAMUSCULAR | Status: AC
  Filled 2022-04-15: qty 5

## 2022-04-15 MED ORDER — MIDAZOLAM HCL 2 MG/2ML IJ SOLN
INTRAMUSCULAR | Status: AC
  Filled 2022-04-15: qty 2

## 2022-04-15 MED ORDER — CHLORHEXIDINE GLUCONATE 0.12 % MT SOLN: 15.0000 mL | Freq: Once | OROMUCOSAL | Status: AC

## 2022-04-15 MED ORDER — CEFAZOLIN SODIUM-DEXTROSE 2-4 GM/100ML-% IV SOLN
INTRAVENOUS | Status: AC
  Filled 2022-04-15: qty 100

## 2022-04-15 MED ORDER — ORAL CARE MOUTH RINSE: 15.0000 mL | Freq: Once | OROMUCOSAL | Status: AC

## 2022-04-15 MED ORDER — CHLORHEXIDINE GLUCONATE 0.12 % MT SOLN
OROMUCOSAL | Status: AC
  Administered 2022-04-15: 15 mL via OROMUCOSAL
  Filled 2022-04-15: qty 15

## 2022-04-15 MED ORDER — DEXAMETHASONE SODIUM PHOSPHATE 10 MG/ML IJ SOLN
INTRAMUSCULAR | Status: AC
  Filled 2022-04-15: qty 1

## 2022-04-15 MED ORDER — LACTATED RINGERS IV SOLN: INTRAVENOUS | Status: DC

## 2022-04-15 MED ORDER — PHENYLEPHRINE 80 MCG/ML (10ML) SYRINGE FOR IV PUSH (FOR BLOOD PRESSURE SUPPORT)
PREFILLED_SYRINGE | INTRAVENOUS | Status: AC
  Filled 2022-04-15: qty 10

## 2022-04-15 MED ORDER — PROPOFOL 10 MG/ML IV BOLUS
INTRAVENOUS | Status: AC
  Filled 2022-04-15: qty 20

## 2022-04-15 MED ORDER — ROCURONIUM BROMIDE 10 MG/ML (PF) SYRINGE
PREFILLED_SYRINGE | INTRAVENOUS | Status: AC
  Filled 2022-04-15: qty 10

## 2022-04-15 MED ORDER — CEFAZOLIN SODIUM-DEXTROSE 2-4 GM/100ML-% IV SOLN: 2.0000 g | INTRAVENOUS | Status: DC

## 2022-04-15 NOTE — Progress Notes (Signed)
  NEUROSURGERY PROGRESS NOTE   History reviewed. Unfortunately patient has been taking his daily aspirin including a dose this am.  EXAM:  BP (!) 151/86   Pulse 62   Temp 98.3 F (36.8 C) (Oral)   Resp 16   Ht 6\' 2"  (1.88 m)   Wt 104.1 kg   SpO2 93%   BMI 29.48 kg/m   Awake, alert, oriented  Speech fluent, appropriate  CN grossly intact  5/5 BUE/BLE   IMPRESSION:  67 y.o. male s/p SAH with delayed development of HCP. Unfortunately has been taking asa including dose this am. Given the elective nature of this case, I do not think proceeding while patient is on ASA, even low-dose, is necessary  PLAN: - Will reschedule for next week - I have spoken with the patient and his son who manages medications and asked them to stop his aspirin starting now.   Consuella Lose, MD [facility]

## 2022-04-15 NOTE — Progress Notes (Signed)
Pt. Didn't stop aspirin. Dr. Kathyrn Sheriff notified. Stated he will come see pt. Surgery cancelled. Jordie, Rep from [place]. Jude integerated the device earlier, but did not turn the device off.

## 2022-04-16 ENCOUNTER — Other Ambulatory Visit: Payer: Self-pay | Admitting: Neurosurgery

## 2022-04-21 NOTE — Progress Notes (Addendum)
Patient originally had a PAT on 3/27. Surgery was postponed on 04/02 due to patient not stopping aspirin.  Instructions only  PPM/ICD - St Jude ICD  Device Orders - Printed and in chart Rep Notified - Arlys John notified. Will be here at noon to check on patient  Last dose of ASA was on 04/15/22   -------------  SDW INSTRUCTIONS given:  Your procedure is scheduled on 04/22/22.  Report to [facility] Main Entrance "A" at 11:30 A.M., and check in at the Admitting office.  Call this number if you have problems the morning of surgery:  [phone]   Remember:  Do not eat after midnight the night before your surgery  You may drink clear liquids until 1030 the morning of your surgery.   Clear liquids allowed are: Water, Non-Citrus Juices (without pulp), Carbonated Beverages, Clear Tea, Black Coffee Only, and Gatorade    Take these medicines the morning of surgery with A SIP OF WATER  Coreg  As of today, STOP taking any Aspirin (unless otherwise instructed by your surgeon) Aleve, Naproxen, Ibuprofen, Motrin, Advil, Goody's, BC's, all herbal medications, fish oil, and all vitamins.                      Do not wear jewelry, make up, or nail polish            Do not wear lotions, powders, perfumes/colognes, or deodorant.            Do not shave 48 hours prior to surgery.  Men may shave face and neck.            Do not bring valuables to the hospital.            [facility] is not responsible for any belongings or valuables.  Do NOT Smoke (Tobacco/Vaping) 24 hours prior to your procedure If you use a CPAP at night, you may bring all equipment for your overnight stay.   Contacts, glasses, dentures or bridgework may not be worn into surgery.      For patients admitted to the hospital, discharge time will be determined by your treatment team.   Patients discharged the day of surgery will not be allowed to drive home, and someone needs to stay with them for 24 hours.   Special instructions:    [place]- Preparing For Surgery  Before surgery, you can play an important role. Because skin is not sterile, your skin needs to be as free of germs as possible. You can reduce the number of germs on your skin by washing with CHG (chlorahexidine gluconate) Soap before surgery.  CHG is an antiseptic cleaner which kills germs and bonds with the skin to continue killing germs even after washing.    Oral Hygiene is also important to reduce your risk of infection.  Remember - BRUSH YOUR TEETH THE MORNING OF SURGERY WITH YOUR REGULAR TOOTHPASTE  Please do not use if you have an allergy to CHG or antibacterial soaps. If your skin becomes reddened/irritated stop using the CHG.  Do not shave (including legs and underarms) for at least 48 hours prior to first CHG shower. It is OK to shave your face.  Please follow these instructions carefully.   Shower the NIGHT BEFORE SURGERY and the MORNING OF SURGERY with DIAL Soap.   Pat yourself dry with a CLEAN TOWEL.  Wear CLEAN PAJAMAS to bed the night before surgery  Place CLEAN SHEETS on your bed the night of your  first shower and DO NOT SLEEP WITH PETS.   Day of Surgery: Please shower morning of surgery  Wear Clean/Comfortable clothing the morning of surgery Do not apply any deodorants/lotions.   Remember to brush your teeth WITH YOUR REGULAR TOOTHPASTE.   Questions were answered. Patient verbalized understanding of instructions.

## 2022-04-22 ENCOUNTER — Encounter (HOSPITAL_COMMUNITY): Payer: Self-pay | Admitting: Neurosurgery

## 2022-04-22 ENCOUNTER — Other Ambulatory Visit: Payer: Self-pay

## 2022-04-22 ENCOUNTER — Inpatient Hospital Stay (HOSPITAL_COMMUNITY): Payer: Medicare HMO | Admitting: Anesthesiology

## 2022-04-22 ENCOUNTER — Inpatient Hospital Stay (HOSPITAL_COMMUNITY)
Admission: RE | Admit: 2022-04-22 | Discharge: 2022-04-23 | DRG: 032 | Disposition: A | Discharge status: Home / Self Care | Payer: Medicare HMO | Attending: Neurosurgery | Admitting: Neurosurgery

## 2022-04-22 ENCOUNTER — Encounter (HOSPITAL_COMMUNITY)
Admission: RE | Disposition: A | Discharge status: Home / Self Care | Payer: Self-pay | Source: Home / Self Care | Attending: Neurosurgery

## 2022-04-22 DIAGNOSIS — E785 Hyperlipidemia, unspecified: Secondary | ICD-10-CM | POA: Diagnosis not present

## 2022-04-22 DIAGNOSIS — F17210 Nicotine dependence, cigarettes, uncomplicated: Secondary | ICD-10-CM | POA: Diagnosis not present

## 2022-04-22 DIAGNOSIS — I509 Heart failure, unspecified: Secondary | ICD-10-CM

## 2022-04-22 DIAGNOSIS — I2510 Atherosclerotic heart disease of native coronary artery without angina pectoris: Secondary | ICD-10-CM

## 2022-04-22 DIAGNOSIS — I252 Old myocardial infarction: Secondary | ICD-10-CM | POA: Diagnosis not present

## 2022-04-22 DIAGNOSIS — G4733 Obstructive sleep apnea (adult) (pediatric): Secondary | ICD-10-CM | POA: Diagnosis not present

## 2022-04-22 DIAGNOSIS — G910 Communicating hydrocephalus: Secondary | ICD-10-CM

## 2022-04-22 DIAGNOSIS — Z950 Presence of cardiac pacemaker: Secondary | ICD-10-CM | POA: Diagnosis not present

## 2022-04-22 DIAGNOSIS — Z8673 Personal history of transient ischemic attack (TIA), and cerebral infarction without residual deficits: Secondary | ICD-10-CM

## 2022-04-22 DIAGNOSIS — I5022 Chronic systolic (congestive) heart failure: Secondary | ICD-10-CM | POA: Diagnosis present

## 2022-04-22 DIAGNOSIS — F17200 Nicotine dependence, unspecified, uncomplicated: Secondary | ICD-10-CM | POA: Diagnosis not present

## 2022-04-22 DIAGNOSIS — Z79899 Other long term (current) drug therapy: Secondary | ICD-10-CM

## 2022-04-22 DIAGNOSIS — I110 Hypertensive heart disease with heart failure: Secondary | ICD-10-CM | POA: Diagnosis not present

## 2022-04-22 HISTORY — PX: LAPAROSCOPIC REVISION VENTRICULAR-PERITONEAL (V-P) SHUNT: SHX5924

## 2022-04-22 HISTORY — PX: VENTRICULOPERITONEAL SHUNT: SHX204

## 2022-04-22 SURGERY — SHUNT INSERTION VENTRICULAR-PERITONEAL
Anesthesia: General

## 2022-04-22 MED ORDER — CHLORHEXIDINE GLUCONATE CLOTH 2 % EX PADS
6.0000 | MEDICATED_PAD | Freq: Once | CUTANEOUS | Status: AC
  Administered 2022-04-22: 6 via TOPICAL

## 2022-04-22 MED ORDER — CEFAZOLIN SODIUM-DEXTROSE 2-4 GM/100ML-% IV SOLN
2.0000 g | INTRAVENOUS | Status: AC
  Administered 2022-04-22: 2 g via INTRAVENOUS
  Filled 2022-04-22: qty 100

## 2022-04-22 MED ORDER — PROMETHAZINE HCL 25 MG/ML IJ SOLN: 6.2500 mg | INTRAMUSCULAR | Status: DC | PRN

## 2022-04-22 MED ORDER — THROMBIN 5000 UNITS EX SOLR
CUTANEOUS | Status: AC
  Filled 2022-04-22: qty 5000

## 2022-04-22 MED ORDER — OXYCODONE-ACETAMINOPHEN 5-325 MG PO TABS: 1.0000 | ORAL_TABLET | ORAL | Status: DC | PRN

## 2022-04-22 MED ORDER — 0.9 % SODIUM CHLORIDE (POUR BTL) OPTIME
TOPICAL | Status: DC | PRN
  Administered 2022-04-22: 1000 mL

## 2022-04-22 MED ORDER — OXYCODONE HCL 5 MG/5ML PO SOLN: 5.0000 mg | Freq: Once | ORAL | Status: DC | PRN

## 2022-04-22 MED ORDER — ONDANSETRON HCL 4 MG/2ML IJ SOLN
INTRAMUSCULAR | Status: AC
  Filled 2022-04-22: qty 2

## 2022-04-22 MED ORDER — ACETAMINOPHEN 325 MG PO TABS: 650.0000 mg | ORAL_TABLET | ORAL | Status: DC | PRN

## 2022-04-22 MED ORDER — FENOFIBRATE 160 MG PO TABS
160.0000 mg | ORAL_TABLET | Freq: Every day | ORAL | Status: DC
  Administered 2022-04-22 – 2022-04-23 (×2): 160 mg via ORAL
  Filled 2022-04-22 (×3): qty 1

## 2022-04-22 MED ORDER — DEXAMETHASONE SODIUM PHOSPHATE 10 MG/ML IJ SOLN
INTRAMUSCULAR | Status: DC | PRN
  Administered 2022-04-22: 10 mg via INTRAVENOUS

## 2022-04-22 MED ORDER — LIDOCAINE 2% (20 MG/ML) 5 ML SYRINGE
INTRAMUSCULAR | Status: DC | PRN
  Administered 2022-04-22: 50 mg via INTRAVENOUS

## 2022-04-22 MED ORDER — HYDROCODONE-ACETAMINOPHEN 5-325 MG PO TABS: 1.0000 | ORAL_TABLET | ORAL | Status: DC | PRN

## 2022-04-22 MED ORDER — PROMETHAZINE HCL 12.5 MG PO TABS: 12.5000 mg | ORAL_TABLET | ORAL | Status: DC | PRN

## 2022-04-22 MED ORDER — ONDANSETRON HCL 4 MG/2ML IJ SOLN: 4.0000 mg | INTRAMUSCULAR | Status: DC | PRN

## 2022-04-22 MED ORDER — SODIUM CHLORIDE 0.9 % IV SOLN: INTRAVENOUS | Status: DC

## 2022-04-22 MED ORDER — SUGAMMADEX SODIUM 200 MG/2ML IV SOLN
INTRAVENOUS | Status: DC | PRN
  Administered 2022-04-22: 190.6 mg via INTRAVENOUS

## 2022-04-22 MED ORDER — ONDANSETRON HCL 4 MG/2ML IJ SOLN
INTRAMUSCULAR | Status: DC | PRN
  Administered 2022-04-22: 4 mg via INTRAVENOUS

## 2022-04-22 MED ORDER — MIDAZOLAM HCL 2 MG/2ML IJ SOLN
INTRAMUSCULAR | Status: AC
  Filled 2022-04-22: qty 2

## 2022-04-22 MED ORDER — CEFAZOLIN SODIUM-DEXTROSE 1-4 GM/50ML-% IV SOLN
1.0000 g | Freq: Three times a day (TID) | INTRAVENOUS | Status: AC
  Administered 2022-04-22 – 2022-04-23 (×2): 1 g via INTRAVENOUS
  Filled 2022-04-22 (×2): qty 50

## 2022-04-22 MED ORDER — CHLORHEXIDINE GLUCONATE CLOTH 2 % EX PADS: 6.0000 | MEDICATED_PAD | Freq: Once | CUTANEOUS | Status: DC

## 2022-04-22 MED ORDER — CHLORHEXIDINE GLUCONATE 0.12 % MT SOLN
15.0000 mL | Freq: Once | OROMUCOSAL | Status: AC
  Administered 2022-04-22: 15 mL via OROMUCOSAL
  Filled 2022-04-22: qty 15

## 2022-04-22 MED ORDER — ONDANSETRON HCL 4 MG PO TABS: 4.0000 mg | ORAL_TABLET | ORAL | Status: DC | PRN

## 2022-04-22 MED ORDER — AMISULPRIDE (ANTIEMETIC) 5 MG/2ML IV SOLN: 10.0000 mg | Freq: Once | INTRAVENOUS | Status: DC | PRN

## 2022-04-22 MED ORDER — FENTANYL CITRATE (PF) 250 MCG/5ML IJ SOLN
INTRAMUSCULAR | Status: DC | PRN
  Administered 2022-04-22: 100 ug via INTRAVENOUS
  Administered 2022-04-22 (×2): 50 ug via INTRAVENOUS

## 2022-04-22 MED ORDER — GLYCOPYRROLATE 0.2 MG/ML IJ SOLN
INTRAMUSCULAR | Status: DC | PRN
  Administered 2022-04-22: .1 mg via INTRAVENOUS

## 2022-04-22 MED ORDER — ORAL CARE MOUTH RINSE: 15.0000 mL | Freq: Once | OROMUCOSAL | Status: AC

## 2022-04-22 MED ORDER — ETOMIDATE 2 MG/ML IV SOLN
INTRAVENOUS | Status: DC | PRN
  Administered 2022-04-22: 20 mg via INTRAVENOUS

## 2022-04-22 MED ORDER — BUPIVACAINE-EPINEPHRINE (PF) 0.25% -1:200000 IJ SOLN
INTRAMUSCULAR | Status: AC
  Filled 2022-04-22: qty 30

## 2022-04-22 MED ORDER — LIDOCAINE-EPINEPHRINE 1 %-1:100000 IJ SOLN
INTRAMUSCULAR | Status: AC
  Filled 2022-04-22: qty 1

## 2022-04-22 MED ORDER — PHENYLEPHRINE HCL-NACL 20-0.9 MG/250ML-% IV SOLN
INTRAVENOUS | Status: DC | PRN
  Administered 2022-04-22: 25 ug/min via INTRAVENOUS

## 2022-04-22 MED ORDER — FENTANYL CITRATE (PF) 250 MCG/5ML IJ SOLN
INTRAMUSCULAR | Status: AC
  Filled 2022-04-22: qty 5

## 2022-04-22 MED ORDER — PANTOPRAZOLE SODIUM 40 MG IV SOLR
40.0000 mg | Freq: Every day | INTRAVENOUS | Status: DC
  Administered 2022-04-22: 40 mg via INTRAVENOUS
  Filled 2022-04-22: qty 10

## 2022-04-22 MED ORDER — THROMBIN 5000 UNITS EX SOLR
OROMUCOSAL | Status: DC | PRN
  Administered 2022-04-22: 5 mL via TOPICAL

## 2022-04-22 MED ORDER — LACTATED RINGERS IV SOLN: INTRAVENOUS | Status: DC | PRN

## 2022-04-22 MED ORDER — CARVEDILOL 12.5 MG PO TABS
12.5000 mg | ORAL_TABLET | Freq: Two times a day (BID) | ORAL | Status: DC
  Administered 2022-04-22 – 2022-04-23 (×2): 12.5 mg via ORAL
  Filled 2022-04-22 (×2): qty 1

## 2022-04-22 MED ORDER — BACITRACIN ZINC 500 UNIT/GM EX OINT
TOPICAL_OINTMENT | CUTANEOUS | Status: AC
  Filled 2022-04-22: qty 28.35

## 2022-04-22 MED ORDER — BUPIVACAINE-EPINEPHRINE 0.25% -1:200000 IJ SOLN
INTRAMUSCULAR | Status: DC | PRN
  Administered 2022-04-22: 10 mL

## 2022-04-22 MED ORDER — ROCURONIUM BROMIDE 10 MG/ML (PF) SYRINGE
PREFILLED_SYRINGE | INTRAVENOUS | Status: DC | PRN
  Administered 2022-04-22: 60 mg via INTRAVENOUS

## 2022-04-22 MED ORDER — BACITRACIN ZINC 500 UNIT/GM EX OINT
TOPICAL_OINTMENT | CUTANEOUS | Status: DC | PRN
  Administered 2022-04-22: 1 via TOPICAL

## 2022-04-22 MED ORDER — LOSARTAN POTASSIUM 50 MG PO TABS
100.0000 mg | ORAL_TABLET | Freq: Every day | ORAL | Status: DC
  Administered 2022-04-22 – 2022-04-23 (×2): 100 mg via ORAL
  Filled 2022-04-22 (×2): qty 2

## 2022-04-22 MED ORDER — ACETAMINOPHEN 650 MG RE SUPP: 650.0000 mg | RECTAL | Status: DC | PRN

## 2022-04-22 MED ORDER — NITROGLYCERIN 0.4 MG SL SUBL: 0.4000 mg | SUBLINGUAL_TABLET | SUBLINGUAL | Status: DC | PRN

## 2022-04-22 MED ORDER — DEXAMETHASONE SODIUM PHOSPHATE 10 MG/ML IJ SOLN
INTRAMUSCULAR | Status: AC
  Filled 2022-04-22: qty 1

## 2022-04-22 MED ORDER — HYDROMORPHONE HCL 1 MG/ML IJ SOLN: 0.2500 mg | INTRAMUSCULAR | Status: DC | PRN

## 2022-04-22 MED ORDER — PROPOFOL 10 MG/ML IV BOLUS
INTRAVENOUS | Status: AC
  Filled 2022-04-22: qty 20

## 2022-04-22 MED ORDER — LABETALOL HCL 5 MG/ML IV SOLN: 10.0000 mg | INTRAVENOUS | Status: DC | PRN

## 2022-04-22 MED ORDER — LIDOCAINE-EPINEPHRINE 1 %-1:100000 IJ SOLN
INTRAMUSCULAR | Status: DC | PRN
  Administered 2022-04-22: 7 mL

## 2022-04-22 MED ORDER — MEPERIDINE HCL 25 MG/ML IJ SOLN: 6.2500 mg | INTRAMUSCULAR | Status: DC | PRN

## 2022-04-22 MED ORDER — OXYCODONE HCL 5 MG PO TABS: 5.0000 mg | ORAL_TABLET | Freq: Once | ORAL | Status: DC | PRN

## 2022-04-22 SURGICAL SUPPLY — 81 items
ADH SKN CLS APL DERMABOND .7 (GAUZE/BANDAGES/DRESSINGS) ×1
BAG COUNTER SPONGE SURGICOUNT (BAG) ×2 IMPLANT
BAG SPNG CNTER NS LX DISP (BAG) ×2
BLADE CLIPPER SURG (BLADE) ×2 IMPLANT
BLADE SURG 11 STRL SS (BLADE) ×2 IMPLANT
BOOT SUTURE VASCULAR YLW (MISCELLANEOUS)
BUR PRECISION FLUTE 5.0 (BURR) ×1 IMPLANT
CANISTER SUCT 3000ML PPV (MISCELLANEOUS) ×1 IMPLANT
CATH VENTRICULAR 7CM (Shunt) IMPLANT
CLAMP SUTURE YELLOW 5 PAIRS (MISCELLANEOUS) IMPLANT
CLIP RANEY DISP (INSTRUMENTS) IMPLANT
DERMABOND ADVANCED .7 DNX12 (GAUZE/BANDAGES/DRESSINGS) ×1 IMPLANT
DRAPE HALF SHEET 40X57 (DRAPES) ×1 IMPLANT
DRAPE INCISE IOBAN 66X45 STRL (DRAPES) ×1 IMPLANT
DRAPE ORTHO SPLIT 77X108 STRL (DRAPES) ×2
DRAPE SURG ORHT 6 SPLT 77X108 (DRAPES) ×2 IMPLANT
DRSG OPSITE POSTOP 3X4 (GAUZE/BANDAGES/DRESSINGS) IMPLANT
DURAPREP 26ML APPLICATOR (WOUND CARE) ×2 IMPLANT
ELECT REM PT RETURN 9FT ADLT (ELECTROSURGICAL) ×1
ELECT SOLID GEL RDN PRO-PADZ (MISCELLANEOUS) ×1
ELECTRODE REM PT RTRN 9FT ADLT (ELECTROSURGICAL) ×1 IMPLANT
ELECTRODE SOLI GEL RDN PROPADZ (MISCELLANEOUS) IMPLANT
GAUZE 4X4 16PLY ~~LOC~~+RFID DBL (SPONGE) IMPLANT
GLOVE BIO SURGEON STRL SZ7.5 (GLOVE) IMPLANT
GLOVE BIO SURGEON STRL SZ8 (GLOVE) ×1 IMPLANT
GLOVE BIOGEL PI IND STRL 7.5 (GLOVE) ×2 IMPLANT
GLOVE BIOGEL PI IND STRL 8 (GLOVE) ×1 IMPLANT
GLOVE ECLIPSE 7.0 STRL STRAW (GLOVE) ×2 IMPLANT
GLOVE EXAM NITRILE XL STR (GLOVE) IMPLANT
GOWN STRL REUS W/ TWL LRG LVL3 (GOWN DISPOSABLE) ×3 IMPLANT
GOWN STRL REUS W/ TWL XL LVL3 (GOWN DISPOSABLE) ×1 IMPLANT
GOWN STRL REUS W/TWL 2XL LVL3 (GOWN DISPOSABLE) IMPLANT
GOWN STRL REUS W/TWL LRG LVL3 (GOWN DISPOSABLE) ×3
GOWN STRL REUS W/TWL XL LVL3 (GOWN DISPOSABLE) ×1
HEMOSTAT SURGICEL 2X14 (HEMOSTASIS) IMPLANT
KIT BASIN OR (CUSTOM PROCEDURE TRAY) ×1 IMPLANT
KIT TURNOVER KIT B (KITS) ×1 IMPLANT
MARKER SKIN DUAL TIP RULER LAB (MISCELLANEOUS) ×2 IMPLANT
NDL HYPO 25X1 1.5 SAFETY (NEEDLE) ×1 IMPLANT
NDL INSUFFLATION 14GA 120MM (NEEDLE) IMPLANT
NEEDLE HYPO 22GX1.5 SAFETY (NEEDLE) ×1 IMPLANT
NEEDLE HYPO 25X1 1.5 SAFETY (NEEDLE) ×1 IMPLANT
NEEDLE INSUFFLATION 14GA 120MM (NEEDLE) ×1 IMPLANT
NS IRRIG 1000ML POUR BTL (IV SOLUTION) ×1 IMPLANT
PACK LAMINECTOMY NEURO (CUSTOM PROCEDURE TRAY) ×1 IMPLANT
PAD ARMBOARD 7.5X6 YLW CONV (MISCELLANEOUS) ×3 IMPLANT
PASSER CATH 65CM DISP (NEUROSURGERY SUPPLIES) ×1 IMPLANT
SET TUBE SMOKE EVAC HIGH FLOW (TUBING) IMPLANT
SHEATH COOK PEEL AWAY SET 9F (SHEATH) ×1 IMPLANT
SHEATH PERITONEAL INTRO 61 (SHEATH) ×1 IMPLANT
SHUNT STRATA 11 SNAP REG (Shunt) IMPLANT
SLEEVE Z-THREAD 5X100MM (TROCAR) ×1 IMPLANT
SOL ELECTROSURG ANTI STICK (MISCELLANEOUS) ×1
SOLUTION ELECTROSURG ANTI STCK (MISCELLANEOUS) ×1 IMPLANT
SPIKE FLUID TRANSFER (MISCELLANEOUS) ×2 IMPLANT
SPONGE SURGIFOAM ABS GEL SZ50 (HEMOSTASIS) ×1 IMPLANT
SPONGE T-LAP 4X18 ~~LOC~~+RFID (SPONGE) IMPLANT
STAPLER SKIN PROX WIDE 3.9 (STAPLE) ×1 IMPLANT
STAPLER VISISTAT 35W (STAPLE) IMPLANT
STRIP CLOSURE SKIN 1/2X4 (GAUZE/BANDAGES/DRESSINGS) IMPLANT
SUT ETHILON 3 0 PS 1 (SUTURE) IMPLANT
SUT MNCRL AB 4-0 PS2 18 (SUTURE) IMPLANT
SUT NURALON 4 0 TR CR/8 (SUTURE) IMPLANT
SUT SILK 0 TIES 10X30 (SUTURE) ×1 IMPLANT
SUT SILK 3 0 SH 30 (SUTURE) IMPLANT
SUT VIC AB 3-0 SH 27 (SUTURE) ×1
SUT VIC AB 3-0 SH 27X BRD (SUTURE) ×1 IMPLANT
SUT VIC AB 3-0 SH 8-18 (SUTURE) ×2 IMPLANT
SUT VIC AB 4-0 PS2 27 (SUTURE) ×1 IMPLANT
SUT VICRYL 0 UR6 27IN ABS (SUTURE) ×1 IMPLANT
SYR CONTROL 10ML LL (SYRINGE) ×1 IMPLANT
TAG SUTURE CLAMP YLW 5PR (MISCELLANEOUS)
TOWEL GREEN STERILE (TOWEL DISPOSABLE) ×2 IMPLANT
TOWEL GREEN STERILE FF (TOWEL DISPOSABLE) ×1 IMPLANT
TRAY LAPAROSCOPIC MC (CUSTOM PROCEDURE TRAY) IMPLANT
TROCAR BALLN 12MMX100 BLUNT (TROCAR) IMPLANT
TROCAR Z-THREAD OPTICAL 5X100M (TROCAR) ×1 IMPLANT
TUBE CONNECTING 12X1/4 (SUCTIONS) ×1 IMPLANT
UNDERPAD 30X36 HEAVY ABSORB (UNDERPADS AND DIAPERS) ×1 IMPLANT
WARMER LAPAROSCOPE (MISCELLANEOUS) ×1 IMPLANT
WATER STERILE IRR 1000ML POUR (IV SOLUTION) ×2 IMPLANT

## 2022-04-22 NOTE — Consult Note (Signed)
Consulting Physician: Hyman Hopes Lynnley Doddridge  Referring Provider: Dr. Conchita Paris  Chief Complaint: Hydrocephalus  Reason for Consult: Assist with VP shunt placement   Subjective   HPI: Joe [name] is an 67 y.o. male who is here for VP shunt placement  Past Medical History:  Diagnosis Date   Anemia    Chronic systolic CHF (congestive heart failure), NYHA class 1    EF 37% by nuclear stress test   Coronary artery disease    s/p PCI mild LAD (BMS) in setting of AWMI complicated by Vfib arrest with 40-50% residual disesae in left circ   History of kidney stones    Hyperlipidemia    Hypertension    Ischemic cardiomyopathy    A. 01/09/2011 - s/p St. Jude Fortify ST [place] AICD   Myocardial infarction    OSA (obstructive sleep apnea)    severe with AHI 26/hr intolerant to CPAP   Pneumonia    Presence of permanent cardiac pacemaker    VF (ventricular fibrillation)    arrest in setting of AMI 6/11    Past Surgical History:  Procedure Laterality Date   BACK SURGERY     BACK SURGERY     IMPLANTABLE CARDIOVERTER DEFIBRILLATOR IMPLANT N/A 01/09/2011   Procedure: IMPLANTABLE CARDIOVERTER DEFIBRILLATOR IMPLANT;  Surgeon: Marinus Maw, MD;  Location[facility] C[facility] LAB;  Service: Cardiovascular;  Laterality: N/A;   IR ANGIO INTRA EXTRACRAN SEL INTERNAL CAROTID BILAT MOD SED  01/26/2022   IR ANGIO VERTEBRAL SEL VERTEBRAL UNI R MOD SED  01/26/2022   IR ANGIOGRAM FOLLOW UP STUDY  01/26/2022   IR NEURO EACH ADD'L AFTER BASIC UNI LEFT (MS)  01/26/2022   IR TRANSCATH/EMBOLIZ  01/26/2022   KNEE ARTHROSCOPY     right   PACEMAKER INSERTION  2012   St Jude   RADIOLOGY WITH ANESTHESIA N/A 01/26/2022   Procedure: IR WITH ANESTHESIA;  Surgeon: Lisbeth Renshaw, MD;  Location: [facility] OR;  Service: Radiology;  Laterality: N/A;    Family History  Problem Relation Age of Onset   Aneurysm Mother     Social:  reports that he has been smoking cigarettes. He has a 8.50 pack-year smoking history. He  has never used smokeless tobacco. He reports current alcohol use. He reports that he does not use drugs.  Allergies:  Allergies  Allergen Reactions   Isosorbide Dinitrate Other (See Comments)    headache  headache   Rosuvastatin Other (See Comments)    Muscle aches on 5 mg qd and 20 mg qd Muscle aches on 5 mg qd and 20 mg qd Muscle aches on 5 mg qd and 20 mg qd   Atorvastatin Anxiety    Made his irritable and anxious Made his irritable and anxious    Medications: Current Outpatient Medications  Medication Instructions   acetaminophen (TYLENOL) 650 mg, Oral, Every 6 hours PRN   aspirin 81 mg, Oral, Daily,     carvedilol (COREG) 12.5 mg, Oral, 2 times daily with meals   Evolocumab (REPATHA SURECLICK) 140 MG/ML SOAJ 1 pen , Subcutaneous, Every 14 days   fenofibrate 160 mg, Oral, Daily   fish oil-omega-3 fatty acids 3 g, Oral, Daily   losartan (COZAAR) 50 MG tablet TAKE 1 TABLET(50 MG) BY MOUTH DAILY   Multiple Vitamins-Minerals (MENS MULTIVITAMIN) TABS 1 tablet, Oral, Daily   nitroGLYCERIN (NITROSTAT) 0.4 mg, Sublingual, Every 5 min PRN, Up to 3 doses    sodium chloride 1 g, Oral, 3 times daily with meals  ROS - all of the below systems have been reviewed with the patient and positives are indicated with bold text General: chills, fever or night sweats Eyes: blurry vision or double vision ENT: epistaxis or sore throat Allergy/Immunology: itchy/watery eyes or nasal congestion Hematologic/Lymphatic: bleeding problems, blood clots or swollen lymph nodes Endocrine: temperature intolerance or unexpected weight changes Breast: new or changing breast lumps or nipple discharge Resp: cough, shortness of breath, or wheezing CV: chest pain or dyspnea on exertion GI: as per HPI GU: dysuria, trouble voiding, or hematuria MSK: joint pain or joint stiffness Neuro: TIA or stroke symptoms Derm: pruritus and skin lesion changes Psych: anxiety and depression  Objective   PE Blood  pressure (!) 140/86, pulse 61, temperature 98.1 F (36.7 C), temperature source Oral, resp. rate 16, height 6\' 2"  (1.88 m), weight 95.3 kg, SpO2 98 %. Constitutional: NAD; conversant; no deformities Eyes: Moist conjunctiva; no lid lag; anicteric; PERRL Neck: Trachea midline; no thyromegaly Lungs: Normal respiratory effort; no tactile fremitus CV: RRR; no palpable thrills; no pitting edema GI: Abd Soft, nontender; no palpable hepatosplenomegaly MSK: Normal range of motion of extremities; no clubbing/cyanosis Psychiatric: Appropriate affect; alert and oriented x3 Lymphatic: No palpable cervical or axillary lymphadenopathy  No results found for this or any previous visit (from the past 24 hour(s)).  Imaging Orders  No imaging studies ordered today     Assessment and Plan   Joe [name] is an 67 y.o. male with hydrocephalus here for VP shunt.  I explained my roll in assisting with VP shunt placement with Dr. Conchita Paris.  Discussed procedure, its risks, benefits and alterantives and the patient granted consent to proceed.  Will proceed as scheduled.   Quentin Ore, MD  [facility] Use [email or web address] to contact on call provider  New Patient Billing: [id number] - High MDM

## 2022-04-22 NOTE — Op Note (Addendum)
   Patient: Joe [name] ([birth year], [id number])  Date of Surgery: 04/22/2022   Preoperative Diagnosis: Communicating hydrocephalus   Postoperative Diagnosis: Communicating hydrocephalus   Surgical Procedure: Panel 1 Laparoscopic assisted VP shunt placement: 02233 (CPT) Panel 2 LAPAROSCOPIC VENTRICULAR-PERITONEAL (V-P) SHUNT: 61224 (CPT)   Operative Team Members:  Surgeon(s) and Role:   SURGEON: Dr. Ivar Drape, MD   CO-SURGEON:   Dr. Lisbeth Renshaw, MD  Anesthesiologist: Achille Rich, MD CRNA: Darryl Nestle, CRNA; Epifanio Lesches, CRNA   Anesthesia: General   Fluids:  Total I/O In: 100 [IV Piggyback:100] Out: -   Complications: None  Drains:   VP Shunt    Specimen: None  Disposition:  PACU - hemodynamically stable.  Plan of Care: Per neurosurgery    Indications for Procedure: BURON MARINOFF is a 67 y.o. male who presented with hydrocephalus.  Dr. Conchita Paris asked for laparoscopic assistance for VP shunt placement.  The procedure itself as well as its risks, benefits and alternatives were discussed.  The risks discussed included but were not limited to the risk of infection, bleeding, damage to nearby structures, and need for shunt revision.  After a full discussion and all questions answered the patient granted consent to proceed.  Findings: Shunt placed over right dome of liver   Description of Procedure:   On the date stated above the patient was taken operating room suite.  He was prepped and draped in usual sterile fashion.  A timeout was completed verifying the correct patient, procedure, position, and equipment needed for the case.  Dr. Conchita Paris tunneled the shunt down to the abdomen, please see his note for more details.  I accessed the abdomen via a Veress needle incision just above the umbilicus.  A 5 mm trocar was placed in this position there is no trauma to the underlying viscera with initial trocar placement.  Another 5 mm trocar was  placed in the right lower quadrant.  The shunt was placed subcostally in the right upper quadrant trying to curve the catheter through the subcutaneous and muscular tissues into the abdomen using first a seeker needle, then a wire, then the introducer with the breakaway sheath.  The catheter was placed through the breakaway sheath and the sheath removed.  The catheter was positioned over the right lobe liver and Dr. Conchita Paris flushed the shunt to ensure adequate flow.  The abdomen was then desufflated and the incisions were closed using 4-0 Monocryl and Dermabond.  All sponge needle counts were correct at the end of this case.  At the end of the case we reviewed the infection status of the case. Patient: [facility] Patient Case: Elective Infection Present At Time Of Surgery (PATOS): None  Ivar Drape, MD General, Bariatric, & Minimally Invasive Surgery [facility], [place]

## 2022-04-22 NOTE — Transfer of Care (Signed)
Immediate Anesthesia Transfer of Care Note  Patient: Joe [name]  Procedure(s) Performed: Laparoscopic assisted VP shunt placement LAPAROSCOPIC VENTRICULAR-PERITONEAL (V-P) SHUNT  Patient Location: PACU  Anesthesia Type:General  Level of Consciousness: awake, alert , and oriented  Airway & Oxygen Therapy: Patient Spontanous Breathing and Patient connected to nasal cannula oxygen  Post-op Assessment: Report given to RN and Post -op Vital signs reviewed and stable  Post vital signs: Reviewed and stable  Last Vitals:  Vitals Value Taken Time  BP 182/92 04/22/22 1536  Temp 98   Pulse 71 04/22/22 1542  Resp 13 04/22/22 1542  SpO2 93 % 04/22/22 1542  Vitals shown include unvalidated device data.  Last Pain:  Vitals:   04/22/22 1128  TempSrc: Oral  PainSc: 0-No pain         Complications: No notable events documented.

## 2022-04-22 NOTE — Anesthesia Procedure Notes (Signed)
Procedure Name: Intubation Date/Time: 04/22/2022 2:25 PM  Performed by: Epifanio Lesches, CRNAPre-anesthesia Checklist: Patient identified, Emergency Drugs available, Suction available, Timeout performed and Patient being monitored Patient Re-evaluated:Patient Re-evaluated prior to induction Oxygen Delivery Method: Circle system utilized Preoxygenation: Pre-oxygenation with 100% oxygen Induction Type: IV induction Ventilation: Mask ventilation without difficulty and Oral airway inserted - appropriate to patient size Laryngoscope Size: Mac and 4 Grade View: Grade II Tube type: Oral Tube size: 7.5 mm Number of attempts: 1 Airway Equipment and Method: Stylet Placement Confirmation: ETT inserted through vocal cords under direct vision, positive ETCO2, CO2 detector and breath sounds checked- equal and bilateral Secured at: 24 cm Tube secured with: Tape Dental Injury: Teeth and Oropharynx as per pre-operative assessment

## 2022-04-22 NOTE — Anesthesia Preprocedure Evaluation (Signed)
Anesthesia Evaluation  Patient identified by MRN, date of birth, ID band Patient awake    Reviewed: Allergy & Precautions, H&P , NPO status , Patient's Chart, lab work & pertinent test results  Airway Mallampati: II  TM Distance: >3 FB Neck ROM: full    Dental no notable dental hx.    Pulmonary sleep apnea , Current Smoker and Patient abstained from smoking.   Pulmonary exam normal breath sounds clear to auscultation       Cardiovascular hypertension, + CAD, + Past MI, + Cardiac Stents and +CHF  Normal cardiovascular exam+ Cardiac Defibrillator  Rhythm:regular Rate:Normal  TTE (2017): EF 35%   Neuro/Psych Subarachnoid hemorrhage    GI/Hepatic   Endo/Other    Renal/GU      Musculoskeletal   Abdominal   Peds  Hematology   Anesthesia Other Findings   Reproductive/Obstetrics                             Anesthesia Physical Anesthesia Plan  ASA: 3  Anesthesia Plan: General   Post-op Pain Management: Dilaudid IV   Induction: Intravenous  PONV Risk Score and Plan: 1 and Ondansetron and Treatment may vary due to age or medical condition  Airway Management Planned: Oral ETT  Additional Equipment:   Intra-op Plan:   Post-operative Plan: Extubation in OR  Informed Consent: I have reviewed the patients History and Physical, chart, labs and discussed the procedure including the risks, benefits and alternatives for the proposed anesthesia with the patient or authorized representative who has indicated his/her understanding and acceptance.     Dental advisory given  Plan Discussed with: CRNA, Anesthesiologist and Surgeon  Anesthesia Plan Comments:        Anesthesia Quick Evaluation

## 2022-04-22 NOTE — H&P (Signed)
Chief Complaint   Hydrocephalus  History of Present Illness  Joe [name] is a 67 y.o. male who initially suffered SAH several months ago. Although he made a good recovery, He and family noted worsening confusion and gait instability over the last few weeks. His symptoms were significantly improved after high-volume LP and his imaging did reveal ventriculomegaly suggesting subacute onset of HCP. He therefore presents today for placement of VP shunt. He has been off his aspirin for the last week.  Past Medical History   Past Medical History:  Diagnosis Date   Anemia    Chronic systolic CHF (congestive heart failure), NYHA class 1    EF 37% by nuclear stress test   Coronary artery disease    s/p PCI mild LAD (BMS) in setting of AWMI complicated by Vfib arrest with 40-50% residual disesae in left circ   History of kidney stones    Hyperlipidemia    Hypertension    Ischemic cardiomyopathy    A. 01/09/2011 - s/p St[employer] Jude Fortify [employer] [place] AICD   Myocardial infarction    OSA (obstructive sleep apnea)    severe with AHI 26/hr intolerant to CPAP   Pneumonia    Presence of permanent cardiac pacemaker    VF (ventricular fibrillation)    arrest in setting of AMI 6/11    Past Surgical History   Past Surgical History:  Procedure Laterality Date   BACK SURGERY     BACK SURGERY     IMPLANTABLE CARDIOVERTER DEFIBRILLATOR IMPLANT N/A 01/09/2011   Procedure: IMPLANTABLE CARDIOVERTER DEFIBRILLATOR IMPLANT;  Surgeon: Marinus Maw, MD;  Location: [facility] CATH LAB;  Service: Cardiovascular;  Laterality: N/A;   IR ANGIO INTRA EXTRACRAN SEL INTERNAL CAROTID BILAT MOD SED  01/26/2022   IR ANGIO VERTEBRAL SEL VERTEBRAL UNI R MOD SED  01/26/2022   IR ANGIOGRAM FOLLOW UP STUDY  01/26/2022   IR NEURO EACH ADD'L AFTER BASIC UNI LEFT (MS)  01/26/2022   IR TRANSCATH/EMBOLIZ  01/26/2022   KNEE ARTHROSCOPY     right   PACEMAKER INSERTION  2012   St Jude   RADIOLOGY WITH ANESTHESIA N/A 01/26/2022    Procedure: IR WITH ANESTHESIA;  Surgeon: Lisbeth Renshaw, MD;  Location: [facility] OR;  Service: Radiology;  Laterality: N/A;    Social History   Social History   Tobacco Use   Smoking status: Every Day    Packs/day: 0.25    Years: 34.00    Additional pack years: 0.00    Total pack years: 8.50    Types: Cigarettes    Last attempt to quit: 06/13/2009    Years since quitting: 12.8   Smokeless tobacco: Never   Tobacco comments:    Socially  Vaping Use   Vaping Use: Never used  Substance Use Topics   Alcohol use: Yes    Comment: occasional   Drug use: No    Medications   Prior to Admission medications   Medication Sig Start Date End Date Taking? Authorizing Provider  acetaminophen (TYLENOL) 325 MG tablet Take 2 tablets (650 mg total) by mouth every 6 (six) hours as needed for mild pain (or temp > 37.5 C (99.5 F)). Patient not taking: Reported on 04/08/2022 02/07/22   Coletta Memos, MD  aspirin 81 MG chewable tablet Chew 81 mg by mouth daily.    [provider]  carvedilol (COREG) 12.5 MG tablet Take 1 tablet (12.5 mg total) by mouth 2 (two) times daily with a meal. 11/12/21   Keitha Butte,  Ed Blalock, PA-C  Evolocumab (REPATHA SURECLICK) 140 MG/ML SOAJ Inject 1 pen into the skin every 14 (fourteen) days. 01/31/21   Marinus Maw, MD  fenofibrate 160 MG tablet Take 1 tablet (160 mg total) by mouth daily. 11/12/21   Sheilah Pigeon, PA-C  fish oil-omega-3 fatty acids 1000 MG capsule Take 3 g by mouth daily.    [provider]  losartan (COZAAR) 50 MG tablet TAKE 1 TABLET(50 MG) BY MOUTH DAILY Patient taking differently: Take 100 mg by mouth daily. 11/12/21   Sheilah Pigeon, PA-C  Multiple Vitamins-Minerals (MENS MULTIVITAMIN) TABS Take 1 tablet by mouth daily.    [provider]  nitroGLYCERIN (NITROSTAT) 0.4 MG SL tablet Place 1 tablet (0.4 mg total) under the tongue every 5 (five) minutes as needed for chest pain. Up to 3 doses 11/12/21   Sheilah Pigeon, PA-C   sodium chloride 1 g tablet Take 1 tablet (1 g total) by mouth 3 (three) times daily with meals. Patient not taking: Reported on 04/08/2022 02/07/22   Coletta Memos, MD    Allergies   Allergies  Allergen Reactions   Isosorbide Dinitrate Other (See Comments)    headache  headache   Rosuvastatin Other (See Comments)    Muscle aches on 5 mg qd and 20 mg qd Muscle aches on 5 mg qd and 20 mg qd Muscle aches on 5 mg qd and 20 mg qd   Atorvastatin Anxiety    Made his irritable and anxious Made his irritable and anxious    Review of Systems  ROS  Neurologic Exam  Awake, alert, oriented Memory and concentration grossly intact Speech fluent, appropriate CN grossly intact Motor exam: Upper Extremities Deltoid Bicep Tricep Grip  Right 5/5 5/5 5/5 5/5  Left 5/5 5/5 5/5 5/5   Lower Extremities IP Quad PF DF EHL  Right 5/5 5/5 5/5 5/5 5/5  Left 5/5 5/5 5/5 5/5 5/5   Sensation grossly intact to LT   Impression  - 67 y.o. male s/p SAH with subacute onset of HCP and significant improvement in symptoms after high-volume LP  Plan  - Will proceed with lap-assisted VP shunt placement  I have reviewed the indications for the procedure as well as the details of the procedure and the expected postoperative course and recovery at length with the patient and his family in the office. We have also reviewed in detail the risks, benefits, and alternatives to the procedure. All questions were answered and Joe [name] provided informed consent to proceed.  Lisbeth Renshaw, MD [facility]

## 2022-04-22 NOTE — Op Note (Signed)
  NEUROSURGERY OPERATIVE NOTE   PREOP DIAGNOSIS: Communicating hydrocephalus  POSTOP DIAGNOSIS: Same  PROCEDURE: 1. Laparoscopic Assisted ventriculoperitoneal shunt placement  SURGEON: Dr. Lisbeth Renshaw, MD  CO-SURGEON: Dr. Ivar Drape, MD  ANESTHESIA: General Endotracheal  EBL: Minimal  SPECIMENS: None  DRAINS: None  COMPLICATIONS: None immediate  CONDITION: Stable to PACU  SHUNT PLACED: Medtronic Strata II, set to 1.5  HISTORY: SHEN REITMEIER is a 67 y.o. male with a history of subarachnoid hemorrhage.  While he did initially make an excellent neurologic recovery, he presented subacutely with worsening confusion and gait instability.  His CT scan revealed ventriculomegaly.  He and his family noted significant improvement after high-volume lumbar puncture.  I therefore recommended placement of a ventriculoperitoneal shunt.  The risks, benefits, and alternatives were all reviewed in detail with the patient and his family.  After all her questions were answered informed consent was obtained and witnessed.  PROCEDURE IN DETAIL: The patient was brought to the operating room and transferred to the operative table. After induction of general anesthesia, the patient was positioned on the operative table in the supine position with all pressure points meticulously padded. The skin of the scalp,  neck, chest, and abdomen were prepped in the usual sterile fashion.  After timeout was conducted, the curvilinear right frontal skin incision over Kocher's point was infiltrated with local anesthetic with epinephrine.  Incision was then made sharply and carried down through the galea.  Hemostasis was secured.  High-speed drill was used to create a bur hole over Kocher's point.  The dura was then coagulated and incised.  At this point, the shunt was passed into the retroauricular region with a small counterincision.  The valve was seated subcutaneously.  Shunt passer was then used to  pass the distal catheter from the retroauricular incision to the epigastrium.  The distal portion of the catheter was then placed into the peritoneal cavity under laparoscopic guidance by Dr. Dossie Der, the details of which are dictated in a separate report.  At this point, using standard anatomic landmarks a 7 cm ventricular catheter was passed into the right frontal horn in 2 passes.  Good clear CSF flow was obtained. The catheter was then connected to the valve apparatus.  The valve was then pumped, and good distal flow was observed through the laparoscope within the peritoneal cavity.  At this point the scalp wounds were irrigated with copious amounts of bacitracin irrigation, and closed using 3-0 Vicryl stitches.  The skin was then closed using standard surgical skin staples.  Sterile dressings were then applied.  At the end of the case all sponge needle and instrument counts were correct.  The patient tolerated the procedure well and was extubated in the room and taken to the postanesthesia care unit in stable condition.   Lisbeth Renshaw, MD [facility]

## 2022-04-23 ENCOUNTER — Encounter (HOSPITAL_COMMUNITY): Payer: Self-pay | Admitting: Neurosurgery

## 2022-04-23 MED ORDER — HYDROCODONE-ACETAMINOPHEN 5-325 MG PO TABS: 1.0000 | ORAL_TABLET | Freq: Four times a day (QID) | ORAL | 0 refills | Status: AC | PRN

## 2022-04-23 MED ORDER — PANTOPRAZOLE SODIUM 40 MG PO TBEC: 40.0000 mg | DELAYED_RELEASE_TABLET | Freq: Every day | ORAL | Status: DC

## 2022-04-23 NOTE — Progress Notes (Signed)
Pt with orders to d/c home. PIV removed. Pt is stable. Prescriptions sent to pharmacy pt is aware. Discharge education and packet provided all questions answered. Pt transported via wheelchair to discharge lounge without incident.

## 2022-04-23 NOTE — Discharge Summary (Signed)
Physician Discharge Summary  Patient ID: Joe [name] MRN: [id number] DOB/AGE: [birth year] 67 y.o.  Admit date: 04/22/2022 Discharge date: 04/23/2022  Admission Diagnoses:  Communicating HCP  Discharge Diagnoses:  Same Principal Problem:   Communicating hydrocephalus   Discharged Condition: Stable  Hospital Course:  Joe [name] is a 67 y.o. male admitted after elective right VP shunt placement. He was at baseline postop, ambulating well. Strata valve set to 1.5 in OR  Treatments: Surgery - Right lap-assisted VP shunt,   Discharge Exam: Blood pressure 134/71, pulse (!) 54, temperature (!) 97.5 F (36.4 C), temperature source Oral, resp. rate 14, height 6\' 2"  (1.88 m), weight 95.3 kg, SpO2 98 %. Awake, alert, oriented Speech fluent, appropriate CN grossly intact 5/5 BUE/BLE Wound c/d/i Valve pumps refills briskly  Disposition: Discharge disposition: 01-Home or Self Care       Discharge Instructions     Call MD for:  redness, tenderness, or signs of infection (pain, swelling, redness, odor or green/yellow discharge around incision site)   Complete by: As directed    Call MD for:  temperature >100.4   Complete by: As directed    Diet - low sodium heart healthy   Complete by: As directed    Discharge instructions   Complete by: As directed    Walk at home as much as possible, at least 4 times / day   Increase activity slowly   Complete by: As directed    Lifting restrictions   Complete by: As directed    No lifting > 10 lbs   May shower / Bathe   Complete by: As directed    48 hours after surgery   May walk up steps   Complete by: As directed    Other Restrictions   Complete by: As directed    No bending/twisting at waist   Remove dressing in 24 hours   Complete by: As directed       Allergies as of 04/23/2022       Reactions   Isosorbide Dinitrate Other (See Comments)   headache headache   Rosuvastatin Other (See Comments)   Muscle  aches on 5 mg qd and 20 mg qd Muscle aches on 5 mg qd and 20 mg qd Muscle aches on 5 mg qd and 20 mg qd   Atorvastatin Anxiety   Made his irritable and anxious Made his irritable and anxious        Medication List     TAKE these medications    acetaminophen 325 MG tablet Commonly known as: TYLENOL Take 2 tablets (650 mg total) by mouth every 6 (six) hours as needed for mild pain (or temp > 37.5 C (99.5 F)).   aspirin 81 MG chewable tablet Chew 81 mg by mouth daily.   carvedilol 12.5 MG tablet Commonly known as: COREG Take 1 tablet (12.5 mg total) by mouth 2 (two) times daily with a meal.   fenofibrate 160 MG tablet Take 1 tablet (160 mg total) by mouth daily.   fish oil-omega-3 fatty acids 1000 MG capsule Take 3 g by mouth daily.   HYDROcodone-acetaminophen 5-325 MG tablet Commonly known as: NORCO/VICODIN Take 1 tablet by mouth every 6 (six) hours as needed for up to 3 days for moderate pain.   losartan 50 MG tablet Commonly known as: COZAAR TAKE 1 TABLET(50 MG) BY MOUTH DAILY What changed:  how much to take how to take this when to take this additional instructions   Mens Multivitamin Tabs  Take 1 tablet by mouth daily.   nitroGLYCERIN 0.4 MG SL tablet Commonly known as: NITROSTAT Place 1 tablet (0.4 mg total) under the tongue every 5 (five) minutes as needed for chest pain. Up to 3 doses   Repatha SureClick 140 MG/ML Soaj Generic drug: Evolocumab Inject 1 pen into the skin every 14 (fourteen) days.   sodium chloride 1 g tablet Take 1 tablet (1 g total) by mouth 3 (three) times daily with meals.         SignedJackelyn Hoehn 04/23/2022, 12:57 PM

## 2022-04-23 NOTE — Anesthesia Postprocedure Evaluation (Signed)
Anesthesia Post Note  Patient: KASPAR USELTON  Procedure(s) Performed: Laparoscopic assisted VP shunt placement LAPAROSCOPIC VENTRICULAR-PERITONEAL (V-P) SHUNT     Patient location during evaluation: PACU Anesthesia Type: General Level of consciousness: awake and alert Pain management: pain level controlled Vital Signs Assessment: post-procedure vital signs reviewed and stable Respiratory status: spontaneous breathing, nonlabored ventilation, respiratory function stable and patient connected to nasal cannula oxygen Cardiovascular status: blood pressure returned to baseline and stable Postop Assessment: no apparent nausea or vomiting Anesthetic complications: no   No notable events documented.  Last Vitals:  Vitals:   04/23/22 0343 04/23/22 0747  BP: (!) 143/81 (!) 156/94  Pulse:  60  Resp: 20 17  Temp: (!) 36.4 C (!) 36.4 C  SpO2:  95%    Last Pain:  Vitals:   04/23/22 0747  TempSrc: Oral  PainSc:                  Neri Vieyra S

## 2022-04-24 NOTE — Progress Notes (Signed)
Remote ICD transmission.   

## 2022-05-16 DIAGNOSIS — I2510 Atherosclerotic heart disease of native coronary artery without angina pectoris: Secondary | ICD-10-CM | POA: Diagnosis not present

## 2022-05-16 DIAGNOSIS — Z982 Presence of cerebrospinal fluid drainage device: Secondary | ICD-10-CM | POA: Diagnosis not present

## 2022-05-19 ENCOUNTER — Ambulatory Visit (INDEPENDENT_AMBULATORY_CARE_PROVIDER_SITE_OTHER): Payer: Medicare HMO

## 2022-05-19 DIAGNOSIS — I255 Ischemic cardiomyopathy: Secondary | ICD-10-CM

## 2022-05-20 LAB — CUP PACEART REMOTE DEVICE CHECK
Battery Remaining Longevity: 4 mo
Battery Remaining Percentage: 3 %
Battery Voltage: 2.6 V
Brady Statistic RV Percent Paced: 1 %
Date Time Interrogation Session: 20240506032456
HighPow Impedance: 62 Ohm
HighPow Impedance: 62 Ohm
Implantable Lead Connection Status: 753985
Implantable Lead Implant Date: 20121227
Implantable Lead Location: 753860
Implantable Lead Model: 7122
Implantable Pulse Generator Implant Date: 20121227
Lead Channel Impedance Value: 430 Ohm
Lead Channel Pacing Threshold Amplitude: 0.5 V
Lead Channel Pacing Threshold Pulse Width: 0.5 ms
Lead Channel Sensing Intrinsic Amplitude: 11.8 mV
Lead Channel Setting Pacing Amplitude: 2.5 V
Lead Channel Setting Pacing Pulse Width: 0.5 ms
Lead Channel Setting Sensing Sensitivity: 0.5 mV
Pulse Gen Serial Number: 1037513
Zone Setting Status: 755011

## 2022-05-29 ENCOUNTER — Telehealth: Payer: Self-pay

## 2022-05-29 NOTE — Telephone Encounter (Signed)
The patient called because his ICD vibrating. I got the transmission. I let the patient speak with Leigh, rn.

## 2022-05-29 NOTE — Telephone Encounter (Signed)
ICD reached RRT 05/29/22. Patient advised device has reached RRT and scheduler will call for apt to discuss gen change with provider. Pt. Voiced understanding.

## 2022-05-30 NOTE — Telephone Encounter (Signed)
Pt agreed to come to see Dr. Ladona Ridgel on Jun 12, 2022.

## 2022-06-12 ENCOUNTER — Encounter: Payer: Self-pay | Admitting: Internal Medicine

## 2022-06-12 ENCOUNTER — Ambulatory Visit: Payer: Medicare HMO | Attending: Internal Medicine | Admitting: Internal Medicine

## 2022-06-12 VITALS — BP 142/80 | HR 62 | Ht 74.0 in | Wt 240.2 lb

## 2022-06-12 DIAGNOSIS — I255 Ischemic cardiomyopathy: Secondary | ICD-10-CM | POA: Diagnosis not present

## 2022-06-12 DIAGNOSIS — Z95810 Presence of automatic (implantable) cardiac defibrillator: Secondary | ICD-10-CM | POA: Diagnosis not present

## 2022-06-12 DIAGNOSIS — I2510 Atherosclerotic heart disease of native coronary artery without angina pectoris: Secondary | ICD-10-CM | POA: Diagnosis not present

## 2022-06-12 LAB — BASIC METABOLIC PANEL
BUN: 14 mg/dL (ref 8–27)
CO2: 25 mmol/L (ref 20–29)
Creatinine, Ser: 0.84 mg/dL (ref 0.76–1.27)
eGFR: 97 mL/min/{1.73_m2} (ref >59)

## 2022-06-12 LAB — CBC WITH DIFFERENTIAL/PLATELET

## 2022-06-12 LAB — LIPID PANEL
Chol/HDL Ratio: 3.1 ratio (ref 0.0–5.0)
HDL: 59 mg/dL (ref >39)
LDL Chol Calc (NIH): 90 mg/dL (ref 0–99)
VLDL Cholesterol Cal: 31 mg/dL (ref 5–40)

## 2022-06-12 NOTE — Patient Instructions (Addendum)
Medication Instructions:  Your physician recommends that you continue on your current medications as directed. Please refer to the Current Medication list given to you today.  *If you need a refill on your cardiac medications before your next appointment, please call your pharmacy*  Lab Work: You will have CBC and BMET, Lipid panel, drawn for your pre-procedure labs today.   If you have labs (blood work) drawn today and your tests are completely normal, you will receive your results only by: MyChart Message (if you have MyChart) OR A paper copy in the mail If you have any lab test that is abnormal or we need to change your treatment, we will call you to review the results.  Testing/Procedures: None ordered.  Follow-Up: Dr. [name] has ordered a SJ ICD Generator change, for RRT.  Pt is scheduled for 06/30/2022 at 1130 am.  Surgical scrub given to Pt on 06/12/2022.  We will contact you, and provide the pre-procedure letter with more details in the near future.    Provider:   [name], MD{or one of the following Advanced Practice Providers on your designated Care Team:   Francis Dowse, [place] "[facility]" [place], [place]

## 2022-06-12 NOTE — Progress Notes (Signed)
HPI [name] returns today for ongoing evaluation and management of chronic systolic heart failure, HTN, s/p ICD insertion. He has done well in the interim with no chest pain or sob. No ICD shocks. he remains active. He has reached ERI on his device.  Allergies  Allergen Reactions   Isosorbide Dinitrate Other (See Comments)    headache  headache   Rosuvastatin Other (See Comments)    Muscle aches on 5 mg qd and 20 mg qd Muscle aches on 5 mg qd and 20 mg qd Muscle aches on 5 mg qd and 20 mg qd   Atorvastatin Anxiety    Made his irritable and anxious Made his irritable and anxious     Current Outpatient Medications  Medication Sig Dispense Refill   acetaminophen (TYLENOL) 325 MG tablet Take 2 tablets (650 mg total) by mouth every 6 (six) hours as needed for mild pain (or temp > 37.5 C (99.5 F)). 60 tablet 0   aspirin 81 MG chewable tablet Chew 81 mg by mouth daily.     carvedilol (COREG) 12.5 MG tablet Take 1 tablet (12.5 mg total) by mouth 2 (two) times daily with a meal. 180 tablet 3   Evolocumab (REPATHA SURECLICK) 140 MG/ML SOAJ Inject 1 pen into the skin every 14 (fourteen) days. 2 mL 11   fenofibrate 160 MG tablet Take 1 tablet (160 mg total) by mouth daily. 90 tablet 3   fish oil-omega-3 fatty acids 1000 MG capsule Take 3 g by mouth daily.     losartan (COZAAR) 50 MG tablet TAKE 1 TABLET(50 MG) BY MOUTH DAILY (Patient taking differently: Take 100 mg by mouth daily.) 90 tablet 3   Multiple Vitamins-Minerals (MENS MULTIVITAMIN) TABS Take 1 tablet by mouth daily.     nitroGLYCERIN (NITROSTAT) 0.4 MG SL tablet Place 1 tablet (0.4 mg total) under the tongue every 5 (five) minutes as needed for chest pain. Up to 3 doses 25 tablet 2   sodium chloride 1 g tablet Take 1 tablet (1 g total) by mouth 3 (three) times daily with meals. 6 tablet 0   No current facility-administered medications for this visit.     Past Medical History:  Diagnosis Date   Anemia    Chronic  systolic CHF (congestive heart failure), NYHA class 1 (HCC)    EF 37% by nuclear stress test   Coronary artery disease    s/p PCI mild LAD (BMS) in setting of AWMI complicated by Vfib arrest with 40-50% residual disesae in left circ   History of kidney stones    Hyperlipidemia    Hypertension    Ischemic cardiomyopathy    A. 01/09/2011 - s/p St. Jude Fortify ST [employer] [place] AICD   Myocardial infarction (HCC)    OSA (obstructive sleep apnea)    severe with AHI 26/hr intolerant to CPAP   Pneumonia    Presence of permanent cardiac pacemaker    VF (ventricular fibrillation) (HCC)    arrest in setting of AMI 6/11    ROS:   All systems reviewed and negative except as noted in the HPI.   Past Surgical History:  Procedure Laterality Date   BACK SURGERY     BACK SURGERY     IMPLANTABLE CARDIOVERTER DEFIBRILLATOR IMPLANT N/A 01/09/2011   Procedure: IMPLANTABLE CARDIOVERTER DEFIBRILLATOR IMPLANT;  Surgeon: Marinus Maw, MD;  Location: [facility] CATH LAB;  Service: Cardiovascular;  Laterality: N/A;   IR ANGIO INTRA EXTRACRAN SEL INTERNAL CAROTID BILAT MOD SED  01/26/2022   IR ANGIO VERTEBRAL SEL VERTEBRAL UNI R MOD SED  01/26/2022   IR ANGIOGRAM FOLLOW UP STUDY  01/26/2022   IR NEURO EACH ADD'L AFTER BASIC UNI LEFT (MS)  01/26/2022   IR TRANSCATH/EMBOLIZ  01/26/2022   KNEE ARTHROSCOPY     right   LAPAROSCOPIC REVISION VENTRICULAR-PERITONEAL (V-P) SHUNT N/A 04/22/2022   Procedure: LAPAROSCOPIC VENTRICULAR-PERITONEAL (V-P) SHUNT;  Surgeon: Quentin Ore, MD;  Location: [facility] OR;  Service: General;  Laterality: N/A;   PACEMAKER INSERTION  2012   St Jude   RADIOLOGY WITH ANESTHESIA N/A 01/26/2022   Procedure: IR WITH ANESTHESIA;  Surgeon: Lisbeth Renshaw, MD;  Location: [facility] OR;  Service: Radiology;  Laterality: N/A;   VENTRICULOPERITONEAL SHUNT N/A 04/22/2022   Procedure: Laparoscopic assisted VP shunt placement;  Surgeon: Lisbeth Renshaw, MD;  Location: [facility] OR;  Service: Neurosurgery;  Laterality: N/A;   RM 18     Family History  Problem Relation Age of Onset   Aneurysm Mother      Social History   Socioeconomic History   Marital status: Single    Spouse name: Not on file   Number of children: Not on file   Years of education: Not on file   Highest education level: Not on file  Occupational History   Not on file  Tobacco Use   Smoking status: Every Day    Packs/day: 0.25    Years: 34.00    Additional pack years: 0.00    Total pack years: 8.50    Types: Cigarettes    Last attempt to quit: 06/13/2009    Years since quitting: 13.0   Smokeless tobacco: Never   Tobacco comments:    Socially  Vaping Use   Vaping Use: Never used  Substance and Sexual Activity   Alcohol use: Yes    Comment: occasional   Drug use: No   Sexual activity: Yes  Other Topics Concern   Not on file  Social History Narrative   Not on file   Social Determinants of Health   Financial Resource Strain: Not on file  Food Insecurity: No Food Insecurity (04/22/2022)   Hunger Vital Sign    Worried About Running Out of Food in the Last Year: Never true    Ran Out of Food in the Last Year: Never true  Transportation Needs: No Transportation Needs (04/22/2022)   PRAPARE - Administrator, Civil Service (Medical): No    Lack of Transportation (Non-Medical): No  Physical Activity: Not on file  Stress: Not on file  Social Connections: Not on file  Intimate Partner Violence: Not At Risk (04/22/2022)   Humiliation, Afraid, Rape, and Kick questionnaire    Fear of Current or Ex-Partner: No    Emotionally Abused: No    Physically Abused: No    Sexually Abused: No     BP (!) 142/80   Pulse 62   Ht 6\' 2"  (1.88 m)   Wt 240 lb 3.2 oz (109 kg)   SpO2 97%   BMI 30.84 kg/m   Physical Exam:  Well appearing NAD HEENT: Unremarkable Neck:  No JVD, no thyromegally Lymphatics:  No adenopathy Back:  No CVA tenderness Lungs:  Clear with no wheezes HEART:  Regular rate rhythm, no murmurs, no rubs,  no clicks Abd:  soft, positive bowel sounds, no organomegally, no rebound, no guarding Ext:  2 plus pulses, no edema, no cyanosis, no clubbing Skin:  No rashes no nodules Neuro:  CN II through XII  intact, motor grossly intact  EKG - nsr with IVCD  Assess/Plan: Chronic systolic heart failure - his symptoms remain class 2. He will continue his current meds. ICD - he has reached ERI. He will undergo change out of his ICD. He has class 2 symptoms with a normal PR and IVCD. No indication for ICD upgrade at this time. HTN -his bp is up a bit. We will need to work on this.  Sharlot Gowda Collins Kerby,MD

## 2022-06-12 NOTE — H&P (View-Only) (Signed)
    HPI [name] returns today for ongoing evaluation and management of chronic systolic heart failure, HTN, s/p ICD insertion. He has done well in the interim with no chest pain or sob. No ICD shocks. he remains active. He has reached ERI on his device.  Allergies  Allergen Reactions   Isosorbide Dinitrate Other (See Comments)    headache  headache   Rosuvastatin Other (See Comments)    Muscle aches on 5 mg qd and 20 mg qd Muscle aches on 5 mg qd and 20 mg qd Muscle aches on 5 mg qd and 20 mg qd   Atorvastatin Anxiety    Made his irritable and anxious Made his irritable and anxious     Current Outpatient Medications  Medication Sig Dispense Refill   acetaminophen (TYLENOL) 325 MG tablet Take 2 tablets (650 mg total) by mouth every 6 (six) hours as needed for mild pain (or temp > 37.5 C (99.5 F)). 60 tablet 0   aspirin 81 MG chewable tablet Chew 81 mg by mouth daily.     carvedilol (COREG) 12.5 MG tablet Take 1 tablet (12.5 mg total) by mouth 2 (two) times daily with a meal. 180 tablet 3   Evolocumab (REPATHA SURECLICK) 140 MG/ML SOAJ Inject 1 pen into the skin every 14 (fourteen) days. 2 mL 11   fenofibrate 160 MG tablet Take 1 tablet (160 mg total) by mouth daily. 90 tablet 3   fish oil-omega-3 fatty acids 1000 MG capsule Take 3 g by mouth daily.     losartan (COZAAR) 50 MG tablet TAKE 1 TABLET(50 MG) BY MOUTH DAILY (Patient taking differently: Take 100 mg by mouth daily.) 90 tablet 3   Multiple Vitamins-Minerals (MENS MULTIVITAMIN) TABS Take 1 tablet by mouth daily.     nitroGLYCERIN (NITROSTAT) 0.4 MG SL tablet Place 1 tablet (0.4 mg total) under the tongue every 5 (five) minutes as needed for chest pain. Up to 3 doses 25 tablet 2   sodium chloride 1 g tablet Take 1 tablet (1 g total) by mouth 3 (three) times daily with meals. 6 tablet 0   No current facility-administered medications for this visit.     Past Medical History:  Diagnosis Date   Anemia    Chronic  systolic CHF (congestive heart failure), NYHA class 1 (HCC)    EF 37% by nuclear stress test   Coronary artery disease    s/p PCI mild LAD (BMS) in setting of AWMI complicated by Vfib arrest with 40-50% residual disesae in left circ   History of kidney stones    Hyperlipidemia    Hypertension    Ischemic cardiomyopathy    A. 01/09/2011 - s/p St. Jude Fortify ST VR [place] AICD   Myocardial infarction (HCC)    OSA (obstructive sleep apnea)    severe with AHI 26/hr intolerant to CPAP   Pneumonia    Presence of permanent cardiac pacemaker    VF (ventricular fibrillation) (HCC)    arrest in setting of AMI 6/11    ROS:   All systems reviewed and negative except as noted in the HPI.   Past Surgical History:  Procedure Laterality Date   BACK SURGERY     BACK SURGERY     IMPLANTABLE CARDIOVERTER DEFIBRILLATOR IMPLANT N/A 01/09/2011   Procedure: IMPLANTABLE CARDIOVERTER DEFIBRILLATOR IMPLANT;  Surgeon: Frank Novelo W Memorie Yokoyama, MD;  Location: [facility] CATH LAB;  Service: Cardiovascular;  Laterality: N/A;   IR ANGIO INTRA EXTRACRAN SEL INTERNAL CAROTID BILAT MOD SED    01/26/2022   IR ANGIO VERTEBRAL SEL VERTEBRAL UNI R MOD SED  01/26/2022   IR ANGIOGRAM FOLLOW UP STUDY  01/26/2022   IR NEURO EACH ADD'L AFTER BASIC UNI LEFT (MS)  01/26/2022   IR TRANSCATH/EMBOLIZ  01/26/2022   KNEE ARTHROSCOPY     right   LAPAROSCOPIC REVISION VENTRICULAR-PERITONEAL (V-P) SHUNT N/A 04/22/2022   Procedure: LAPAROSCOPIC VENTRICULAR-PERITONEAL (V-P) SHUNT;  Surgeon: Stechschulte, Paul J, MD;  Location: [facility] OR;  Service: General;  Laterality: N/A;   PACEMAKER INSERTION  2012   St Jude   RADIOLOGY WITH ANESTHESIA N/A 01/26/2022   Procedure: IR WITH ANESTHESIA;  Surgeon: Nundkumar, Neelesh, MD;  Location: [facility] OR;  Service: Radiology;  Laterality: N/A;   VENTRICULOPERITONEAL SHUNT N/A 04/22/2022   Procedure: Laparoscopic assisted VP shunt placement;  Surgeon: Nundkumar, Neelesh, MD;  Location: [facility] OR;  Service: Neurosurgery;  Laterality: N/A;   RM 18     Family History  Problem Relation Age of Onset   Aneurysm Mother      Social History   Socioeconomic History   Marital status: Single    Spouse name: Not on file   Number of children: Not on file   Years of education: Not on file   Highest education level: Not on file  Occupational History   Not on file  Tobacco Use   Smoking status: Every Day    Packs/day: 0.25    Years: 34.00    Additional pack years: 0.00    Total pack years: 8.50    Types: Cigarettes    Last attempt to quit: 06/13/2009    Years since quitting: 13.0   Smokeless tobacco: Never   Tobacco comments:    Socially  Vaping Use   Vaping Use: Never used  Substance and Sexual Activity   Alcohol use: Yes    Comment: occasional   Drug use: No   Sexual activity: Yes  Other Topics Concern   Not on file  Social History Narrative   Not on file   Social Determinants of Health   Financial Resource Strain: Not on file  Food Insecurity: No Food Insecurity (04/22/2022)   Hunger Vital Sign    Worried About Running Out of Food in the Last Year: Never true    Ran Out of Food in the Last Year: Never true  Transportation Needs: No Transportation Needs (04/22/2022)   PRAPARE - Transportation    Lack of Transportation (Medical): No    Lack of Transportation (Non-Medical): No  Physical Activity: Not on file  Stress: Not on file  Social Connections: Not on file  Intimate Partner Violence: Not At Risk (04/22/2022)   Humiliation, Afraid, Rape, and Kick questionnaire    Fear of Current or Ex-Partner: No    Emotionally Abused: No    Physically Abused: No    Sexually Abused: No     BP (!) 142/80   Pulse 62   Ht 6' 2" (1.88 m)   Wt 240 lb 3.2 oz (109 kg)   SpO2 97%   BMI 30.84 kg/m   Physical Exam:  Well appearing NAD HEENT: Unremarkable Neck:  No JVD, no thyromegally Lymphatics:  No adenopathy Back:  No CVA tenderness Lungs:  Clear with no wheezes HEART:  Regular rate rhythm, no murmurs, no rubs,  no clicks Abd:  soft, positive bowel sounds, no organomegally, no rebound, no guarding Ext:  2 plus pulses, no edema, no cyanosis, no clubbing Skin:  No rashes no nodules Neuro:  CN II through XII   intact, motor grossly intact  EKG - nsr with IVCD  Assess/Plan: Chronic systolic heart failure - his symptoms remain class 2. He will continue his current meds. ICD - he has reached ERI. He will undergo change out of his ICD. He has class 2 symptoms with a normal PR and IVCD. No indication for ICD upgrade at this time. HTN -his bp is up a bit. We will need to work on this.  Rosland Riding,MD 

## 2022-06-13 LAB — BASIC METABOLIC PANEL
BUN/Creatinine Ratio: 17 (ref 10–24)
Calcium: 9.3 mg/dL (ref 8.6–10.2)
Chloride: 107 mmol/L — ABNORMAL HIGH (ref 96–106)
Glucose: 82 mg/dL (ref 70–99)
Potassium: 4.5 mmol/L (ref 3.5–5.2)
Sodium: 145 mmol/L — ABNORMAL HIGH (ref 134–144)

## 2022-06-13 LAB — CBC WITH DIFFERENTIAL/PLATELET
Eos: 3 %
Hematocrit: 43.1 % (ref 37.5–51.0)
Hemoglobin: 14.5 g/dL (ref 13.0–17.7)
Immature Granulocytes: 1 %
Lymphs: 35 %
MCHC: 33.6 g/dL (ref 31.5–35.7)
MCV: 92 fL (ref 79–97)
Monocytes: 6 %
RBC: 4.7 x10E6/uL (ref 4.14–5.80)
RDW: 11.8 % (ref 11.6–15.4)
WBC: 8.8 10*3/uL (ref 3.4–10.8)

## 2022-06-13 LAB — LIPID PANEL
Cholesterol, Total: 180 mg/dL (ref 100–199)
Triglycerides: 183 mg/dL — ABNORMAL HIGH (ref 0–149)

## 2022-06-17 NOTE — Addendum Note (Signed)
Addended by: Geralyn Flash D on: 06/17/2022 10:38 AM   Modules accepted: Level of Service

## 2022-06-17 NOTE — Progress Notes (Signed)
Remote ICD transmission.   

## 2022-06-26 ENCOUNTER — Other Ambulatory Visit: Payer: Self-pay | Admitting: Neurosurgery

## 2022-06-26 DIAGNOSIS — I609 Nontraumatic subarachnoid hemorrhage, unspecified: Secondary | ICD-10-CM

## 2022-06-27 NOTE — Pre-Procedure Instructions (Signed)
Spoke with patient's son.    Instructed on the following items: Arrival time 0930 Nothing to eat or drink after midnight No meds AM of procedure Responsible person to drive you home and stay with you for 24 hrs Wash with special soap night before and morning of procedure

## 2022-06-30 ENCOUNTER — Other Ambulatory Visit: Payer: Self-pay

## 2022-06-30 ENCOUNTER — Other Ambulatory Visit: Payer: Self-pay | Admitting: Neurosurgery

## 2022-06-30 ENCOUNTER — Ambulatory Visit (HOSPITAL_COMMUNITY)
Admission: RE | Admit: 2022-06-30 | Discharge: 2022-06-30 | Disposition: A | Discharge status: Home / Self Care | Payer: Medicare HMO | Attending: Internal Medicine | Admitting: Internal Medicine

## 2022-06-30 ENCOUNTER — Ambulatory Visit (HOSPITAL_COMMUNITY)
Admission: RE | Disposition: A | Discharge status: Home / Self Care | Payer: Self-pay | Source: Home / Self Care | Attending: Internal Medicine

## 2022-06-30 DIAGNOSIS — Z45010 Encounter for checking and testing of cardiac pacemaker pulse generator [battery]: Secondary | ICD-10-CM

## 2022-06-30 DIAGNOSIS — I5022 Chronic systolic (congestive) heart failure: Secondary | ICD-10-CM | POA: Diagnosis not present

## 2022-06-30 DIAGNOSIS — I110 Hypertensive heart disease with heart failure: Secondary | ICD-10-CM | POA: Insufficient documentation

## 2022-06-30 DIAGNOSIS — Z4502 Encounter for adjustment and management of automatic implantable cardiac defibrillator: Secondary | ICD-10-CM | POA: Insufficient documentation

## 2022-06-30 DIAGNOSIS — Z87891 Personal history of nicotine dependence: Secondary | ICD-10-CM | POA: Insufficient documentation

## 2022-06-30 DIAGNOSIS — I255 Ischemic cardiomyopathy: Secondary | ICD-10-CM

## 2022-06-30 HISTORY — PX: ICD GENERATOR CHANGEOUT: EP1231

## 2022-06-30 SURGERY — ICD GENERATOR CHANGEOUT

## 2022-06-30 MED ORDER — MIDAZOLAM HCL 5 MG/5ML IJ SOLN
INTRAMUSCULAR | Status: DC | PRN
  Administered 2022-06-30: 2 mg via INTRAVENOUS

## 2022-06-30 MED ORDER — SODIUM CHLORIDE 0.9 % IV SOLN
INTRAVENOUS | Status: AC
  Filled 2022-06-30: qty 2

## 2022-06-30 MED ORDER — FENTANYL CITRATE (PF) 100 MCG/2ML IJ SOLN
INTRAMUSCULAR | Status: DC | PRN
  Administered 2022-06-30: 25 ug via INTRAVENOUS

## 2022-06-30 MED ORDER — MIDAZOLAM HCL 2 MG/2ML IJ SOLN
INTRAMUSCULAR | Status: AC
  Filled 2022-06-30: qty 2

## 2022-06-30 MED ORDER — ACETAMINOPHEN 325 MG PO TABS: 325.0000 mg | ORAL_TABLET | ORAL | Status: DC | PRN

## 2022-06-30 MED ORDER — SODIUM CHLORIDE 0.9 % IV SOLN
80.0000 mg | INTRAVENOUS | Status: AC
  Administered 2022-06-30: 80 mg

## 2022-06-30 MED ORDER — CHLORHEXIDINE GLUCONATE 4 % EX SOLN
4.0000 | Freq: Once | CUTANEOUS | Status: DC
  Filled 2022-06-30: qty 60

## 2022-06-30 MED ORDER — FENTANYL CITRATE (PF) 100 MCG/2ML IJ SOLN
INTRAMUSCULAR | Status: AC
  Filled 2022-06-30: qty 2

## 2022-06-30 MED ORDER — CEFAZOLIN SODIUM-DEXTROSE 2-4 GM/100ML-% IV SOLN: 2.0000 g | INTRAVENOUS | Status: AC

## 2022-06-30 MED ORDER — CEFAZOLIN SODIUM-DEXTROSE 2-4 GM/100ML-% IV SOLN
INTRAVENOUS | Status: AC
  Administered 2022-06-30: 2 g via INTRAVENOUS
  Filled 2022-06-30: qty 100

## 2022-06-30 MED ORDER — POVIDONE-IODINE 10 % EX SWAB: 2.0000 | Freq: Once | CUTANEOUS | Status: DC

## 2022-06-30 MED ORDER — LIDOCAINE HCL (PF) 1 % IJ SOLN
INTRAMUSCULAR | Status: AC
  Filled 2022-06-30: qty 60

## 2022-06-30 MED ORDER — SODIUM CHLORIDE 0.9 % IV SOLN: INTRAVENOUS | Status: DC

## 2022-06-30 MED ORDER — ONDANSETRON HCL 4 MG/2ML IJ SOLN: 4.0000 mg | Freq: Four times a day (QID) | INTRAMUSCULAR | Status: DC | PRN

## 2022-06-30 SURGICAL SUPPLY — 6 items
CABLE SURGICAL S-101-97-12 (CABLE) ×1 IMPLANT
ICD GALLANT VR DF1 CDVRA500T (ICD Generator) IMPLANT
PAD DEFIB RADIO PHYSIO CONN (PAD) ×1 IMPLANT
POUCH AIGIS-R ANTIBACT ICD (Mesh General) ×1 IMPLANT
POUCH AIGIS-R ANTIBACT ICD LRG (Mesh General) IMPLANT
TRAY PACEMAKER INSERTION (PACKS) ×1 IMPLANT

## 2022-06-30 NOTE — Discharge Instructions (Signed)

## 2022-06-30 NOTE — Interval H&P Note (Signed)
History and Physical Interval Note:  06/30/2022 9:52 AM  Joe [name]  has presented today for surgery, with the diagnosis of RRT.  The various methods of treatment have been discussed with the patient and family. After consideration of risks, benefits and other options for treatment, the patient has consented to  Procedure(s): ICD GENERATOR CHANGEOUT (N/A) as a surgical intervention.  The patient's history has been reviewed, patient examined, no change in status, stable for surgery.  I have reviewed the patient's chart and labs.  Questions were answered to the patient's satisfaction.     [name]

## 2022-07-01 ENCOUNTER — Telehealth: Payer: Self-pay | Admitting: Internal Medicine

## 2022-07-01 ENCOUNTER — Encounter (HOSPITAL_COMMUNITY): Payer: Self-pay | Admitting: Internal Medicine

## 2022-07-01 MED FILL — Midazolam HCl Inj 2 MG/2ML (Base Equivalent): INTRAMUSCULAR | Qty: 2 | Status: AC

## 2022-07-01 MED FILL — Lidocaine HCl Local Preservative Free (PF) Inj 1%: INTRAMUSCULAR | Qty: 30 | Status: AC

## 2022-07-01 NOTE — Telephone Encounter (Signed)
Patient calling with questions about the device he has. Please advise

## 2022-07-01 NOTE — Telephone Encounter (Signed)
Spoke to the patient, he is seeking clarification when he is able to remove " thick white bandage" off  his  incision site. Will forward to device clinic for advise.

## 2022-07-01 NOTE — Telephone Encounter (Signed)
Spoke with patient informed him that he could take the clear bandage off this afternoon 24hrs after procedure but to leave the white steri-strips in place until 07/10/22 when he comes in for wound check. Patient voiced understanding

## 2022-07-07 ENCOUNTER — Telehealth: Payer: Self-pay | Admitting: Internal Medicine

## 2022-07-07 NOTE — Telephone Encounter (Signed)
   The patient is calling to inquire about his lab results. He mentioned that the tests were done two weeks ago

## 2022-07-07 NOTE — Telephone Encounter (Signed)
Returned call and spoke with patient, he states he is calling to follow-up on his lab work for his cholesterol. Last labs were drawn 06/12/22.  No note from provider seen. Elevated triglycerides noted on 06/12/22 (183, up from 83 in January 2024). Encouraged patient to follow heart healthy diet and exercise as tolerated.  Patient is scheduled for wound check 07/10/22  following ICD gen change on 06/30/22.  Patient asked if he needed to come in for repeat bloodwork.  Will forward to Dr. Ladona Ridgel to review and advise.

## 2022-07-08 NOTE — Telephone Encounter (Signed)
Cut out all sweet foods and drinks.

## 2022-07-10 ENCOUNTER — Ambulatory Visit: Payer: Medicare HMO | Attending: Cardiology

## 2022-07-10 DIAGNOSIS — I255 Ischemic cardiomyopathy: Secondary | ICD-10-CM | POA: Diagnosis not present

## 2022-07-10 LAB — CUP PACEART INCLINIC DEVICE CHECK
Battery Remaining Longevity: 126 mo
Brady Statistic RV Percent Paced: 0.01 %
Date Time Interrogation Session: 20240627171236
HighPow Impedance: 56.25 Ohm
Implantable Lead Connection Status: 753985
Implantable Lead Implant Date: 20121227
Implantable Lead Location: 753860
Implantable Lead Model: 7122
Implantable Pulse Generator Implant Date: 20240617
Lead Channel Impedance Value: 387.5 Ohm
Lead Channel Pacing Threshold Amplitude: 0.75 V
Lead Channel Pacing Threshold Amplitude: 0.75 V
Lead Channel Pacing Threshold Pulse Width: 0.5 ms
Lead Channel Pacing Threshold Pulse Width: 0.5 ms
Lead Channel Sensing Intrinsic Amplitude: 12 mV
Lead Channel Setting Pacing Amplitude: 2.5 V
Lead Channel Setting Pacing Pulse Width: 0.5 ms
Lead Channel Setting Sensing Sensitivity: 0.5 mV
Pulse Gen Serial Number: 111072390
Zone Setting Status: 755011

## 2022-07-10 NOTE — Progress Notes (Signed)
Wound check appointment. Steri-strips removed. Wound without redness or edema. Incision edges approximated, wound well healed. Normal device function. Thresholds, sensing, and impedances consistent with implant measurements. Device programmed appropriately for chronic leads. Histogram distribution appropriate for patient and level of activity. No mode switches or ventricular arrhythmias noted. Patient educated about wound care and shock plan. ROV in 3 months with implanting physician.  There are no restrictions on arm movement or lifting, gen change only.

## 2022-07-10 NOTE — Patient Instructions (Signed)
   After Your ICD (Implantable Cardiac Defibrillator)    Monitor your defibrillator site for redness, swelling, and drainage. Call the device clinic at [phone] if you experience these symptoms or fever/chills.  Your incision was closed with Steri-strips or staples:  You may shower 7 days after your procedure and wash your incision with soap and water. Avoid lotions, ointments, or perfumes over your incision until it is well-healed.  You may use a hot tub or a pool after your wound check appointment if the incision is completely closed.   There are no other restrictions in arm movement after your wound check appointment.  Your ICD is designed to protect you from life threatening heart rhythms. Because of this, you may receive a shock.   1 shock with no symptoms:  Call the office during business hours. 1 shock with symptoms (chest pain, chest pressure, dizziness, lightheadedness, shortness of breath, overall feeling unwell):  Call 911. If you experience 2 or more shocks in 24 hours:  Call 911. If you receive a shock, you should not drive.  [place] DMV - no driving for 6 months if you receive appropriate therapy from your ICD.   ICD Alerts:  Some alerts are vibratory and others beep. These are NOT emergencies. Please call our office to let us know. If this occurs at night or on weekends, it can wait until the next business day. Send a remote transmission.  If your device is capable of reading fluid status (for heart failure), you will be offered monthly monitoring to review this with you.   Remote monitoring is used to monitor your ICD from home. This monitoring is scheduled every 91 days by our office. It allows us to keep an eye on the functioning of your device to ensure it is working properly. You will routinely see your Electrophysiologist annually (more often if necessary).  

## 2022-07-22 ENCOUNTER — Other Ambulatory Visit: Payer: Self-pay | Admitting: Neurosurgery

## 2022-07-22 ENCOUNTER — Ambulatory Visit (HOSPITAL_COMMUNITY)
Admission: RE | Admit: 2022-07-22 | Discharge: 2022-07-22 | Disposition: A | Discharge status: Home / Self Care | Payer: Medicare HMO | Source: Ambulatory Visit | Attending: Neurosurgery | Admitting: Neurosurgery

## 2022-07-22 DIAGNOSIS — I609 Nontraumatic subarachnoid hemorrhage, unspecified: Secondary | ICD-10-CM

## 2022-07-22 DIAGNOSIS — Z87891 Personal history of nicotine dependence: Secondary | ICD-10-CM | POA: Diagnosis not present

## 2022-07-22 DIAGNOSIS — I671 Cerebral aneurysm, nonruptured: Secondary | ICD-10-CM | POA: Diagnosis not present

## 2022-07-22 DIAGNOSIS — Z48812 Encounter for surgical aftercare following surgery on the circulatory system: Secondary | ICD-10-CM | POA: Diagnosis not present

## 2022-07-22 DIAGNOSIS — Z8679 Personal history of other diseases of the circulatory system: Secondary | ICD-10-CM | POA: Insufficient documentation

## 2022-07-22 HISTORY — PX: IR US GUIDE VASC ACCESS RIGHT: IMG2390

## 2022-07-22 HISTORY — PX: IR ANGIO VERTEBRAL SEL VERTEBRAL UNI R MOD SED: IMG5368

## 2022-07-22 LAB — CBC WITH DIFFERENTIAL/PLATELET
Abs Immature Granulocytes: 0.03 10*3/uL (ref 0.00–0.07)
Basophils Absolute: 0.1 10*3/uL (ref 0.0–0.1)
Basophils Relative: 1 %
Eosinophils Absolute: 0.3 10*3/uL (ref 0.0–0.5)
Eosinophils Relative: 4 %
HCT: 43.5 % (ref 39.0–52.0)
Hemoglobin: 14.6 g/dL (ref 13.0–17.0)
Immature Granulocytes: 0 %
Lymphocytes Relative: 41 %
Lymphs Abs: 3.4 10*3/uL (ref 0.7–4.0)
MCH: 30.2 pg (ref 26.0–34.0)
MCHC: 33.6 g/dL (ref 30.0–36.0)
MCV: 90.1 fL (ref 80.0–100.0)
Monocytes Absolute: 0.5 10*3/uL (ref 0.1–1.0)
Monocytes Relative: 6 %
Neutro Abs: 3.8 10*3/uL (ref 1.7–7.7)
Neutrophils Relative %: 48 %
Platelets: 201 10*3/uL (ref 150–400)
RBC: 4.83 MIL/uL (ref 4.22–5.81)
RDW: 12.5 % (ref 11.5–15.5)
WBC: 8.2 10*3/uL (ref 4.0–10.5)
nRBC: 0 % (ref 0.0–0.2)

## 2022-07-22 LAB — BASIC METABOLIC PANEL
Anion gap: 9 (ref 5–15)
BUN: 15 mg/dL (ref 8–23)
CO2: 24 mmol/L (ref 22–32)
Calcium: 8.8 mg/dL — ABNORMAL LOW (ref 8.9–10.3)
Chloride: 108 mmol/L (ref 98–111)
Creatinine, Ser: 0.8 mg/dL (ref 0.61–1.24)
GFR, Estimated: >60 mL/min (ref >60)
Glucose, Bld: 105 mg/dL — ABNORMAL HIGH (ref 70–99)
Potassium: 4 mmol/L (ref 3.5–5.1)
Sodium: 141 mmol/L (ref 135–145)

## 2022-07-22 LAB — URINALYSIS, ROUTINE W REFLEX MICROSCOPIC
Bilirubin Urine: NEGATIVE
Glucose, UA: NEGATIVE mg/dL
Hgb urine dipstick: NEGATIVE
Ketones, ur: NEGATIVE mg/dL
Leukocytes,Ua: NEGATIVE
Nitrite: NEGATIVE
Protein, ur: NEGATIVE mg/dL
Specific Gravity, Urine: 1.014 (ref 1.005–1.030)
pH: 5 (ref 5.0–8.0)

## 2022-07-22 LAB — APTT: aPTT: 27 seconds (ref 24–36)

## 2022-07-22 LAB — PROTIME-INR
INR: 1 (ref 0.8–1.2)
Prothrombin Time: 13.7 seconds (ref 11.4–15.2)

## 2022-07-22 MED ORDER — LIDOCAINE HCL 1 % IJ SOLN
INTRAMUSCULAR | Status: AC
  Filled 2022-07-22: qty 20

## 2022-07-22 MED ORDER — FENTANYL CITRATE (PF) 100 MCG/2ML IJ SOLN
INTRAMUSCULAR | Status: AC
  Filled 2022-07-22: qty 2

## 2022-07-22 MED ORDER — CEFAZOLIN SODIUM-DEXTROSE 2-4 GM/100ML-% IV SOLN: 2.0000 g | INTRAVENOUS | Status: DC

## 2022-07-22 MED ORDER — MIDAZOLAM HCL 2 MG/2ML IJ SOLN
INTRAMUSCULAR | Status: AC
  Filled 2022-07-22: qty 2

## 2022-07-22 MED ORDER — MIDAZOLAM HCL 2 MG/2ML IJ SOLN
INTRAMUSCULAR | Status: AC | PRN
  Administered 2022-07-22: 1 mg via INTRAVENOUS

## 2022-07-22 MED ORDER — HYDROCODONE-ACETAMINOPHEN 5-325 MG PO TABS: 1.0000 | ORAL_TABLET | ORAL | Status: DC | PRN

## 2022-07-22 MED ORDER — VERAPAMIL HCL 2.5 MG/ML IV SOLN
INTRAVENOUS | Status: AC
  Filled 2022-07-22: qty 2

## 2022-07-22 MED ORDER — FENTANYL CITRATE (PF) 100 MCG/2ML IJ SOLN
INTRAMUSCULAR | Status: AC | PRN
  Administered 2022-07-22: 25 ug via INTRAVENOUS

## 2022-07-22 MED ORDER — IOHEXOL 300 MG/ML  SOLN: 150.0000 mL | Freq: Once | INTRAMUSCULAR | Status: DC | PRN

## 2022-07-22 MED ORDER — SODIUM CHLORIDE 0.9 % IV SOLN
INTRAVENOUS | Status: AC | PRN
  Administered 2022-07-22: 10 mL/h via INTRAVENOUS

## 2022-07-22 MED ORDER — HEPARIN SODIUM (PORCINE) 1000 UNIT/ML IJ SOLN
INTRAMUSCULAR | Status: AC | PRN
  Administered 2022-07-22: 3000 [IU] via INTRAVENOUS

## 2022-07-22 MED ORDER — SODIUM CHLORIDE 0.9 % IV SOLN: INTRAVENOUS | Status: DC

## 2022-07-22 MED ORDER — LIDOCAINE HCL 1 % IJ SOLN: 20.0000 mL | Freq: Once | INTRAMUSCULAR | Status: DC

## 2022-07-22 MED ORDER — HEPARIN SODIUM (PORCINE) 1000 UNIT/ML IJ SOLN
INTRAMUSCULAR | Status: AC
  Filled 2022-07-22: qty 10

## 2022-07-22 MED ORDER — NITROGLYCERIN 1 MG/10 ML FOR IR/CATH LAB
INTRA_ARTERIAL | Status: AC | PRN
  Administered 2022-07-22: 200 ug via INTRA_ARTERIAL

## 2022-07-22 MED ORDER — NITROGLYCERIN 1 MG/10 ML FOR IR/CATH LAB
INTRA_ARTERIAL | Status: AC
  Filled 2022-07-22: qty 10

## 2022-07-22 MED ORDER — LIDOCAINE HCL 1 % IJ SOLN
INTRAMUSCULAR | Status: AC | PRN
  Administered 2022-07-22: 1 mL

## 2022-07-22 NOTE — H&P (Signed)
Chief Complaint   Aneurysm  History of Present Illness  Joe [name] is a 67 y.o. 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  He has made an excellent neurologic recovery although he did require subacute placement of a ventriculoperitoneal shunt.  He comes in today for routine short-term angiographic follow-up.  Past Medical History   Past Medical History:  Diagnosis Date   Anemia    Chronic systolic CHF (congestive heart failure), NYHA class 1 (HCC)    EF 37% by nuclear stress test   Coronary artery disease    s/p PCI mild LAD (BMS) in setting of AWMI complicated by Vfib arrest with 40-50% residual disesae in left circ   History of kidney stones    Hyperlipidemia    Hypertension    Ischemic cardiomyopathy    A. 01/09/2011 - s/p St. Jude Fortify ST [employer] [place] AICD   Myocardial infarction (HCC)    OSA (obstructive sleep apnea)    severe with AHI 26/hr intolerant to CPAP   Pneumonia    Presence of permanent cardiac pacemaker    VF (ventricular fibrillation) (HCC)    arrest in setting of AMI 6/11    Past Surgical History   Past Surgical History:  Procedure Laterality Date   BACK SURGERY     BACK SURGERY     ICD GENERATOR CHANGEOUT N/A 06/30/2022   Procedure: ICD GENERATOR CHANGEOUT;  Surgeon: Marinus Maw, MD;  Location: [facility] INVASIVE CV LAB;  Service: Cardiovascular;  Laterality: N/A;   IMPLANTABLE CARDIOVERTER DEFIBRILLATOR IMPLANT N/A 01/09/2011   Procedure: IMPLANTABLE CARDIOVERTER DEFIBRILLATOR IMPLANT;  Surgeon: Marinus Maw, MD;  Location: [facility] CATH LAB;  Service: Cardiovascular;  Laterality: N/A;   IR ANGIO INTRA EXTRACRAN SEL INTERNAL CAROTID BILAT MOD SED  01/26/2022   IR ANGIO VERTEBRAL SEL VERTEBRAL UNI R MOD SED  01/26/2022   IR ANGIOGRAM FOLLOW UP STUDY  01/26/2022   IR NEURO EACH ADD'L AFTER BASIC UNI LEFT (MS)  01/26/2022   IR TRANSCATH/EMBOLIZ  01/26/2022   KNEE  ARTHROSCOPY     right   LAPAROSCOPIC REVISION VENTRICULAR-PERITONEAL (V-P) SHUNT N/A 04/22/2022   Procedure: LAPAROSCOPIC VENTRICULAR-PERITONEAL (V-P) SHUNT;  Surgeon: Quentin Ore, MD;  Location: [facility] OR;  Service: General;  Laterality: N/A;   PACEMAKER INSERTION  2012   St Jude   RADIOLOGY WITH ANESTHESIA N/A 01/26/2022   Procedure: IR WITH ANESTHESIA;  Surgeon: Lisbeth Renshaw, MD;  Location: [facility] OR;  Service: Radiology;  Laterality: N/A;   VENTRICULOPERITONEAL SHUNT N/A 04/22/2022   Procedure: Laparoscopic assisted VP shunt placement;  Surgeon: Lisbeth Renshaw, MD;  Location: [facility] OR;  Service: Neurosurgery;  Laterality: N/A;  RM 18    Social History   Social History   Tobacco Use   Smoking status: Every Day    Packs/day: 0.25    Years: 34.00    Additional pack years: 0.00    Total pack years: 8.50    Types: Cigarettes    Last attempt to quit: 06/13/2009    Years since quitting: 13.1   Smokeless tobacco: Never   Tobacco comments:    Socially  Vaping Use   Vaping Use: Never used  Substance Use Topics   Alcohol use: Yes    Comment: occasional   Drug use: No    Medications   Prior to Admission medications   Medication Sig Start Date End Date Taking? Authorizing Provider  aspirin 81 MG chewable tablet  Chew 81 mg by mouth daily.   Yes [provider]  carvedilol (COREG) 12.5 MG tablet Take 1 tablet (12.5 mg total) by mouth 2 (two) times daily with a meal. 11/12/21  Yes Sheilah Pigeon, PA-C  fenofibrate 160 MG tablet Take 1 tablet (160 mg total) by mouth daily. 11/12/21  Yes Sheilah Pigeon, PA-C  fish oil-omega-3 fatty acids 1000 MG capsule Take 3 g by mouth daily.   Yes [provider]  losartan (COZAAR) 50 MG tablet TAKE 1 TABLET(50 MG) BY MOUTH DAILY Patient taking differently: Take 100 mg by mouth daily. 11/12/21  Yes Sheilah Pigeon, PA-C  Multiple Vitamins-Minerals (MENS MULTIVITAMIN) TABS Take 1 tablet by mouth daily.   Yes [provider]  acetaminophen (TYLENOL) 325 MG tablet Take 2 tablets (650 mg total) by mouth every 6 (six) hours as needed for mild pain (or temp > 37.5 C (99.5 F)). 02/07/22   Coletta Memos, MD  Evolocumab (REPATHA SURECLICK) 140 MG/ML SOAJ Inject 1 pen into the skin every 14 (fourteen) days. Patient not taking: Reported on 06/25/2022 01/31/21   Marinus Maw, MD  nitroGLYCERIN (NITROSTAT) 0.4 MG SL tablet Place 1 tablet (0.4 mg total) under the tongue every 5 (five) minutes as needed for chest pain. Up to 3 doses 11/12/21   Sheilah Pigeon, PA-C  sodium chloride 1 g tablet Take 1 tablet (1 g total) by mouth 3 (three) times daily with meals. 02/07/22   Coletta Memos, MD    Allergies   Allergies  Allergen Reactions   Isosorbide Dinitrate Other (See Comments)    headache   Rosuvastatin Other (See Comments)    Muscle aches on 5 mg qd and 20 mg qd    Atorvastatin Anxiety    Made his irritable and anxious    Review of Systems  ROS  Neurologic Exam  Awake, alert, oriented Memory and concentration grossly intact Speech fluent, appropriate CN grossly intact Motor exam: Upper Extremities Deltoid Bicep Tricep Grip  Right 5/5 5/5 5/5 5/5  Left 5/5 5/5 5/5 5/5   Lower Extremities IP Quad PF DF EHL  Right 5/5 5/5 5/5 5/5 5/5  Left 5/5 5/5 5/5 5/5 5/5   Sensation grossly intact to LT   Impression  - 67 y.o. male 58-month status post subarachnoid hemorrhage and coil embolization of basilar apex aneurysm, doing well  Plan  -We will proceed with follow-up diagnostic cerebral angiogram likely from right transradial approach  I have reviewed the indications for the procedure as well as the details of the procedure and the expected postoperative course and recovery at length with the patient in the office. We have also reviewed in detail the risks, benefits, and alternatives to the procedure. All questions were answered and Joe [name] provided informed consent to  proceed.  Lisbeth Renshaw, MD [facility]

## 2022-07-22 NOTE — Sedation Documentation (Deleted)
Transported to [employer] [facility], Handoff completed with [employer]

## 2022-07-22 NOTE — Progress Notes (Signed)
Transported to [facility] [place] [facility], Handoff completed with Jamesha RN  

## 2022-08-11 DIAGNOSIS — I609 Nontraumatic subarachnoid hemorrhage, unspecified: Secondary | ICD-10-CM | POA: Diagnosis not present

## 2022-09-29 ENCOUNTER — Ambulatory Visit (INDEPENDENT_AMBULATORY_CARE_PROVIDER_SITE_OTHER): Payer: Medicare HMO

## 2022-09-29 DIAGNOSIS — I5022 Chronic systolic (congestive) heart failure: Secondary | ICD-10-CM

## 2022-09-29 DIAGNOSIS — I255 Ischemic cardiomyopathy: Secondary | ICD-10-CM

## 2022-09-30 LAB — CUP PACEART REMOTE DEVICE CHECK
Battery Remaining Longevity: 119 mo
Battery Remaining Percentage: 95.5 %
Battery Voltage: 3.11 V
Brady Statistic RV Percent Paced: 1 %
Date Time Interrogation Session: 20240915220835
HighPow Impedance: 63 Ohm
Implantable Lead Connection Status: 753985
Implantable Lead Implant Date: 20121227
Implantable Lead Location: 753860
Implantable Lead Model: 7122
Implantable Pulse Generator Implant Date: 20240617
Lead Channel Impedance Value: 350 Ohm
Lead Channel Pacing Threshold Amplitude: 0.75 V
Lead Channel Pacing Threshold Pulse Width: 0.5 ms
Lead Channel Sensing Intrinsic Amplitude: 11.5 mV
Lead Channel Setting Pacing Amplitude: 2.5 V
Lead Channel Setting Pacing Pulse Width: 0.5 ms
Lead Channel Setting Sensing Sensitivity: 0.5 mV
Pulse Gen Serial Number: 111072390
Zone Setting Status: 755011

## 2022-10-09 ENCOUNTER — Other Ambulatory Visit: Payer: Self-pay

## 2022-10-09 ENCOUNTER — Telehealth: Payer: Self-pay | Admitting: Internal Medicine

## 2022-10-09 DIAGNOSIS — I255 Ischemic cardiomyopathy: Secondary | ICD-10-CM

## 2022-10-09 DIAGNOSIS — I2510 Atherosclerotic heart disease of native coronary artery without angina pectoris: Secondary | ICD-10-CM

## 2022-10-09 DIAGNOSIS — I10 Essential (primary) hypertension: Secondary | ICD-10-CM

## 2022-10-09 NOTE — Telephone Encounter (Signed)
New Message:       Patient wants to know if it is time for him to get lab work and get his Lipid checked?

## 2022-10-09 NOTE — Telephone Encounter (Signed)
Spoke with pt. He would like to get his blood drawn to follow up on past labs. Spoke with Dr Excell Seltzer who agreed to labs as Dr Ladona Ridgel is out of the office. Orders placed and lab appointment made. Pt aware.

## 2022-10-10 ENCOUNTER — Ambulatory Visit: Payer: Medicare HMO | Attending: Cardiovascular Disease

## 2022-10-10 DIAGNOSIS — I255 Ischemic cardiomyopathy: Secondary | ICD-10-CM

## 2022-10-10 DIAGNOSIS — I2510 Atherosclerotic heart disease of native coronary artery without angina pectoris: Secondary | ICD-10-CM | POA: Diagnosis not present

## 2022-10-10 DIAGNOSIS — I10 Essential (primary) hypertension: Secondary | ICD-10-CM | POA: Diagnosis not present

## 2022-10-10 LAB — LIPID PANEL
Chol/HDL Ratio: 4.7 {ratio} (ref 0.0–5.0)
Cholesterol, Total: 235 mg/dL — ABNORMAL HIGH (ref 100–199)
HDL: 50 mg/dL (ref >39)
LDL Chol Calc (NIH): 115 mg/dL — ABNORMAL HIGH (ref 0–99)
Triglycerides: 404 mg/dL — ABNORMAL HIGH (ref 0–149)
VLDL Cholesterol Cal: 70 mg/dL — ABNORMAL HIGH (ref 5–40)

## 2022-10-10 LAB — CBC
Hematocrit: 48.2 % (ref 37.5–51.0)
Hemoglobin: 15.9 g/dL (ref 13.0–17.7)
MCH: 30.3 pg (ref 26.6–33.0)
MCHC: 33 g/dL (ref 31.5–35.7)
MCV: 92 fL (ref 79–97)
Platelets: 226 10*3/uL (ref 150–450)
RBC: 5.24 x10E6/uL (ref 4.14–5.80)
RDW: 12.2 % (ref 11.6–15.4)
WBC: 8.9 10*3/uL (ref 3.4–10.8)

## 2022-10-10 LAB — BASIC METABOLIC PANEL
BUN/Creatinine Ratio: 17 (ref 10–24)
BUN: 14 mg/dL (ref 8–27)
CO2: 23 mmol/L (ref 20–29)
Calcium: 9.8 mg/dL (ref 8.6–10.2)
Chloride: 105 mmol/L (ref 96–106)
Creatinine, Ser: 0.82 mg/dL (ref 0.76–1.27)
Glucose: 106 mg/dL — ABNORMAL HIGH (ref 70–99)
Potassium: 4.4 mmol/L (ref 3.5–5.2)
Sodium: 143 mmol/L (ref 134–144)
eGFR: 97 mL/min/{1.73_m2} (ref >59)

## 2022-10-13 NOTE — Progress Notes (Signed)
Remote ICD transmission.   

## 2022-10-22 ENCOUNTER — Telehealth: Payer: Self-pay

## 2022-10-27 ENCOUNTER — Encounter: Payer: Self-pay | Admitting: Internal Medicine

## 2022-10-27 ENCOUNTER — Ambulatory Visit: Payer: Medicare HMO | Attending: Internal Medicine | Admitting: Internal Medicine

## 2022-10-27 VITALS — BP 146/84 | HR 64 | Ht 74.0 in | Wt 257.0 lb

## 2022-10-27 DIAGNOSIS — Z95810 Presence of automatic (implantable) cardiac defibrillator: Secondary | ICD-10-CM

## 2022-10-27 MED ORDER — REPATHA SURECLICK 140 MG/ML ~~LOC~~ SOAJ: 140.0000 mg | SUBCUTANEOUS | 11 refills | Status: DC

## 2022-10-27 NOTE — Progress Notes (Signed)
HPI Mr. Collister returns today for ongoing evaluation and management of chronic systolic heart failure, PAF, intracranial hemorrhage, HTN, ICM, s/p ICD insertion. He has done well in the interim with no chest pain or sob. No ICD shocks. he remains active. He has undergone ICD gen change out and is doing well.  Allergies  Allergen Reactions   Isosorbide Dinitrate Other (See Comments)    headache   Rosuvastatin Other (See Comments)    Muscle aches on 5 mg qd and 20 mg qd    Atorvastatin Anxiety    Made his irritable and anxious     Current Outpatient Medications  Medication Sig Dispense Refill   acetaminophen (TYLENOL) 325 MG tablet Take 2 tablets (650 mg total) by mouth every 6 (six) hours as needed for mild pain (or temp > 37.5 C (99.5 F)). 60 tablet 0   aspirin 81 MG chewable tablet Chew 81 mg by mouth daily.     carvedilol (COREG) 12.5 MG tablet Take 1 tablet (12.5 mg total) by mouth 2 (two) times daily with a meal. 180 tablet 3   Evolocumab (REPATHA SURECLICK) 140 MG/ML SOAJ Inject 1 pen into the skin every 14 (fourteen) days. (Patient not taking: Reported on 06/25/2022) 2 mL 11   fenofibrate 160 MG tablet Take 1 tablet (160 mg total) by mouth daily. 90 tablet 3   fish oil-omega-3 fatty acids 1000 MG capsule Take 3 g by mouth daily.     losartan (COZAAR) 50 MG tablet TAKE 1 TABLET(50 MG) BY MOUTH DAILY (Patient taking differently: Take 100 mg by mouth daily.) 90 tablet 3   Multiple Vitamins-Minerals (MENS MULTIVITAMIN) TABS Take 1 tablet by mouth daily.     nitroGLYCERIN (NITROSTAT) 0.4 MG SL tablet Place 1 tablet (0.4 mg total) under the tongue every 5 (five) minutes as needed for chest pain. Up to 3 doses 25 tablet 2   sodium chloride 1 g tablet Take 1 tablet (1 g total) by mouth 3 (three) times daily with meals. 6 tablet 0   No current facility-administered medications for this visit.     Past Medical History:  Diagnosis Date   Anemia    Chronic systolic CHF  (congestive heart failure), NYHA class 1 (HCC)    EF 37% by nuclear stress test   Coronary artery disease    s/p PCI mild LAD (BMS) in setting of AWMI complicated by Vfib arrest with 40-50% residual disesae in left circ   History of kidney stones    Hyperlipidemia    Hypertension    Ischemic cardiomyopathy    A. 01/09/2011 - s/p St. Jude Fortify [employer] [place] AICD   Myocardial infarction (HCC)    OSA (obstructive sleep apnea)    severe with AHI 26/hr intolerant to CPAP   Pneumonia    Presence of permanent cardiac pacemaker    VF (ventricular fibrillation) (HCC)    arrest in setting of AMI 6/11    ROS:   All systems reviewed and negative except as noted in the HPI.   Past Surgical History:  Procedure Laterality Date   BACK SURGERY     BACK SURGERY     ICD GENERATOR CHANGEOUT N/A 06/30/2022   Procedure: ICD GENERATOR CHANGEOUT;  Surgeon: Marinus Maw, MD;  Location: [facility] INVASIVE CV LAB;  Service: Cardiovascular;  Laterality: N/A;   IMPLANTABLE CARDIOVERTER DEFIBRILLATOR IMPLANT N/A 01/09/2011   Procedure: IMPLANTABLE CARDIOVERTER DEFIBRILLATOR IMPLANT;  Surgeon: Marinus Maw, MD;  Location: [facility] CATH  LAB;  Service: Cardiovascular;  Laterality: N/A;   IR ANGIO INTRA EXTRACRAN SEL INTERNAL CAROTID BILAT MOD SED  01/26/2022   IR ANGIO VERTEBRAL SEL VERTEBRAL UNI R MOD SED  01/26/2022   IR ANGIO VERTEBRAL SEL VERTEBRAL UNI R MOD SED  07/22/2022   IR ANGIOGRAM FOLLOW UP STUDY  01/26/2022   IR NEURO EACH ADD'L AFTER BASIC UNI LEFT (MS)  01/26/2022   IR TRANSCATH/EMBOLIZ  01/26/2022   IR US GUIDE VASC ACCESS RIGHT  07/22/2022   KNEE ARTHROSCOPY     right   LAPAROSCOPIC REVISION VENTRICULAR-PERITONEAL (V-P) SHUNT N/A 04/22/2022   Procedure: LAPAROSCOPIC VENTRICULAR-PERITONEAL (V-P) SHUNT;  Surgeon: Quentin Ore, MD;  Location: [facility] OR;  Service: General;  Laterality: N/A;   PACEMAKER INSERTION  2012   St Jude   RADIOLOGY WITH ANESTHESIA N/A 01/26/2022   Procedure: IR WITH ANESTHESIA;   Surgeon: Lisbeth Renshaw, MD;  Location: [facility] OR;  Service: Radiology;  Laterality: N/A;   VENTRICULOPERITONEAL SHUNT N/A 04/22/2022   Procedure: Laparoscopic assisted VP shunt placement;  Surgeon: Lisbeth Renshaw, MD;  Location: [facility] OR;  Service: Neurosurgery;  Laterality: N/A;  RM 18     Family History  Problem Relation Age of Onset   Aneurysm Mother      Social History   Socioeconomic History   Marital status: Single    Spouse name: Not on file   Number of children: Not on file   Years of education: Not on file   Highest education level: Not on file  Occupational History   Not on file  Tobacco Use   Smoking status: Every Day    Current packs/day: 0.00    Average packs/day: 0.3 packs/day for 34.0 years (8.5 ttl pk-yrs)    Types: Cigarettes    Start date: 06/14/1975    Last attempt to quit: 06/13/2009    Years since quitting: 13.3   Smokeless tobacco: Never   Tobacco comments:    Socially  Vaping Use   Vaping status: Never Used  Substance and Sexual Activity   Alcohol use: Yes    Comment: occasional   Drug use: No   Sexual activity: Yes  Other Topics Concern   Not on file  Social History Narrative   Not on file   Social Determinants of Health   Financial Resource Strain: Not on file  Food Insecurity: No Food Insecurity (04/22/2022)   Hunger Vital Sign    Worried About Running Out of Food in the Last Year: Never true    Ran Out of Food in the Last Year: Never true  Transportation Needs: No Transportation Needs (04/22/2022)   PRAPARE - Administrator, Civil Service (Medical): No    Lack of Transportation (Non-Medical): No  Physical Activity: Not on file  Stress: No Stress Concern Present (05/24/2020)   Received from [facility], [facility] - Occupational Stress Questionnaire    Feeling of Stress : Not at all  Social Connections: Unknown (05/25/2021)   Received from [facility], [facility]   Social  Network    Social Network: Not on file  Intimate Partner Violence: Not At Risk (04/22/2022)   Humiliation, Afraid, Rape, and Kick questionnaire    Fear of Current or Ex-Partner: No    Emotionally Abused: No    Physically Abused: No    Sexually Abused: No     There were no vitals taken for this visit.  Physical Exam:  Well appearing NAD  HEENT: Unremarkable Neck:  No JVD, no thyromegally Lymphatics:  No adenopathy Back:  No CVA tenderness Lungs:  Clear with no wheeze HEART:  Regular rate rhythm, no murmurs, no rubs, no clicks Abd:  soft, positive bowel sounds, no organomegally, no rebound, no guarding Ext:  2 plus pulses, no edema, no cyanosis, no clubbing Skin:  No rashes no nodules Neuro:  CN II through XII intact, motor grossly intact  EKG - nsr  DEVICE  Normal device function.  See PaceArt for details.   Assess/Plan:  Chronic systolic heart failure - his symptoms remain class 2. He will continue his current meds. ICD - he is s/p gen change out. He has class 2 symptoms with a normal PR and IVCD. No indication for ICD upgrade at this time. HTN -his bp is up a bit. We will need to work on this.   Sharlot Gowda Neylan Koroma,MD

## 2022-10-27 NOTE — Patient Instructions (Signed)
Medication Instructions:  Your physician recommends that you continue on your current medications as directed. Please refer to the Current Medication list given to you today.  *If you need a refill on your cardiac medications before your next appointment, please call your pharmacy*  Lab Work: None ordered.  If you have labs (blood work) drawn today and your tests are completely normal, you will receive your results only by: MyChart Message (if you have MyChart) OR A paper copy in the mail If you have any lab test that is abnormal or we need to change your treatment, we will call you to review the results.  Testing/Procedures: None ordered.  Follow-Up: At [facility], you and your health needs are our priority.  As part of our continuing mission to provide you with exceptional heart care, we have created designated Provider Care Teams.  These Care Teams include your primary Cardiologist (physician) and Advanced Practice Providers (APPs -  Physician Assistants and Nurse Practitioners) who all work together to provide you with the care you need, when you need it.   Your next appointment:   1 year(s)  The format for your next appointment:   In Person  Provider:   [name], MD{or one of the following Advanced Practice Providers on your designated Care Team:   Francis Dowse, [place] Casimiro Needle "Mardelle Matte" [place], [place] Earnest Rosier, NP  Remote monitoring is used to monitor your Pacemaker/ ICD from home. This monitoring reduces the number of office visits required to check your device to one time per year. It allows [place] to keep an eye on the functioning of your device to ensure it is working properly.   Important Information About Sugar

## 2022-11-20 NOTE — Telephone Encounter (Signed)
Error

## 2022-11-23 ENCOUNTER — Other Ambulatory Visit: Payer: Self-pay | Admitting: Physician Assistant

## 2022-12-22 DIAGNOSIS — H66012 Acute suppurative otitis media with spontaneous rupture of ear drum, left ear: Secondary | ICD-10-CM | POA: Diagnosis not present

## 2022-12-22 DIAGNOSIS — J208 Acute bronchitis due to other specified organisms: Secondary | ICD-10-CM | POA: Diagnosis not present

## 2022-12-25 ENCOUNTER — Other Ambulatory Visit: Payer: Self-pay | Admitting: Physician Assistant

## 2022-12-29 ENCOUNTER — Ambulatory Visit (INDEPENDENT_AMBULATORY_CARE_PROVIDER_SITE_OTHER): Payer: Medicare HMO

## 2022-12-29 DIAGNOSIS — I255 Ischemic cardiomyopathy: Secondary | ICD-10-CM

## 2022-12-29 DIAGNOSIS — I5022 Chronic systolic (congestive) heart failure: Secondary | ICD-10-CM

## 2022-12-29 LAB — CUP PACEART REMOTE DEVICE CHECK
Battery Remaining Longevity: 116 mo
Battery Remaining Percentage: 94 %
Battery Voltage: 3.07 V
Brady Statistic RV Percent Paced: 1 %
Date Time Interrogation Session: 20241215210359
HighPow Impedance: 59 Ohm
Implantable Lead Connection Status: 753985
Implantable Lead Implant Date: 20121227
Implantable Lead Location: 753860
Implantable Lead Model: 7122
Implantable Pulse Generator Implant Date: 20240617
Lead Channel Impedance Value: 350 Ohm
Lead Channel Pacing Threshold Amplitude: 0.75 V
Lead Channel Pacing Threshold Pulse Width: 0.5 ms
Lead Channel Sensing Intrinsic Amplitude: 11.5 mV
Lead Channel Setting Pacing Amplitude: 2.5 V
Lead Channel Setting Pacing Pulse Width: 0.5 ms
Lead Channel Setting Sensing Sensitivity: 0.5 mV
Pulse Gen Serial Number: 111072390
Zone Setting Status: 755011

## 2023-01-26 DIAGNOSIS — I5022 Chronic systolic (congestive) heart failure: Secondary | ICD-10-CM | POA: Diagnosis not present

## 2023-01-26 DIAGNOSIS — J0100 Acute maxillary sinusitis, unspecified: Secondary | ICD-10-CM | POA: Diagnosis not present

## 2023-02-02 NOTE — Progress Notes (Signed)
Remote ICD transmission.   

## 2023-03-02 ENCOUNTER — Telehealth: Payer: Self-pay

## 2023-03-02 NOTE — Telephone Encounter (Signed)
Alert remote transmission: Successful ATP delivered. 1 VF classified episode recorded on 02/28/23 at 1:15 pm, 6 sec duration. EGM c/w VT appropriately and successfully treated with ATP x 1 Burst, average V rate 210 bpm.    Patient is doing well, no symptoms. Recovered recently from "walking pneumonia" a few weeks back.  Reports some SOB, "anxiety" like feelings with walking.  He does not recall anything in particular at time of event.  Taking all of his meds, no missed doses of Carvedilol. He is due in next month for follow up with GT.  Will cc: Dr. Ladona Ridgel as [place] Child[place] and have EP scheduling team reach out for appt with APP in next few weeks.   [place] DMV driving restrictions given for 6 mths due to VT w/ ATP therapy.  Patient verbalizes understanding.

## 2023-03-05 NOTE — Progress Notes (Addendum)
 Cardiology Office Note Date:  03/05/2023  Patient ID:  Joe [name], Joe [name] 05/12/1955, MRN [id number] PCP:  Henrine Screws, MD  Cardiologist:  Dr. Mayford Knife Electrophysiologist: Dr. Ladona Ridgel    Chief Complaint:   VT alert  History of Present Illness: Joe [name] is a 68 y.o. male with history of ICM w/ICD, chronic CHF (systolic), HTN, CAD w/PCI to LAD 2011 (complicated by VF arrest).   Hx of intracranial hemorrhage  He saw Dr. Ladona Ridgel 10/27/22, doing well, well healed from gen change.  Class II symptoms, no indication for device upgrade  Device clinic alert 03/02/23, VT in VF zone, 02/27/13, treated w/ATP, reported medication compliance, perhaps a sense of anxiety, asymptomatic otherwise, recovering from a PNA  TODAY  Generally had been doing OK Had a terrible cold >> then PNA syndrome, lasted several weeks, ears were all congested as well ended up with 3 rounds of antibiotics, finally perhaps getting past in in the last 2 weeks  For the last 5-6 weeks not feeling as well as usual Walking to the end of the driveway and back (a couple hundred yards) makes him feel "off" Feels "weird" perhaps No rest symptoms Not SOB, feels like perhaps BOTH arm feel weak maybe No CP, tightness, heaviness will sit for a few minutes and feels better  The time of his "event" he did feel near syncope for a second, 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, did fine, at the time of the even was having intercourse No CP, felt perfect before and after No other near syncope No syncope   Device information: SJM single chamber ICD implanted 01/09/11, gen change 06/30/2022  Arrhythmia/AAD hx None to date  Past Medical History:  Diagnosis Date   Anemia    Chronic systolic CHF (congestive heart failure), NYHA class 1 (HCC)    EF 37% by nuclear stress test   Coronary artery disease    s/p PCI mild LAD (BMS) in setting of AWMI complicated by Vfib arrest with 40-50% residual disesae in left circ    History of kidney stones    Hyperlipidemia    Hypertension    Ischemic cardiomyopathy    A. 01/09/2011 - s/p St. Jude Fortify ST [employer] [place] AICD   Myocardial infarction (HCC)    OSA (obstructive sleep apnea)    severe with AHI 26/hr intolerant to CPAP   Pneumonia    Presence of permanent cardiac pacemaker    VF (ventricular fibrillation) (HCC)    arrest in setting of AMI 6/11    Past Surgical History:  Procedure Laterality Date   BACK SURGERY     BACK SURGERY     ICD GENERATOR CHANGEOUT N/A 06/30/2022   Procedure: ICD GENERATOR CHANGEOUT;  Surgeon: Marinus Maw, MD;  Location: [facility] INVASIVE CV LAB;  Service: Cardiovascular;  Laterality: N/A;   IMPLANTABLE CARDIOVERTER DEFIBRILLATOR IMPLANT N/A 01/09/2011   Procedure: IMPLANTABLE CARDIOVERTER DEFIBRILLATOR IMPLANT;  Surgeon: Marinus Maw, MD;  Location: [facility] CATH LAB;  Service: Cardiovascular;  Laterality: N/A;   IR ANGIO INTRA EXTRACRAN SEL INTERNAL CAROTID BILAT MOD SED  01/26/2022   IR ANGIO VERTEBRAL SEL VERTEBRAL UNI R MOD SED  01/26/2022   IR ANGIO VERTEBRAL SEL VERTEBRAL UNI R MOD SED  07/22/2022   IR ANGIOGRAM FOLLOW UP STUDY  01/26/2022   IR NEURO EACH ADD'L AFTER BASIC UNI LEFT (MS)  01/26/2022   IR TRANSCATH/EMBOLIZ  01/26/2022   IR US GUIDE VASC ACCESS RIGHT  07/22/2022   KNEE ARTHROSCOPY  right   LAPAROSCOPIC REVISION VENTRICULAR-PERITONEAL (V-P) SHUNT N/A 04/22/2022   Procedure: LAPAROSCOPIC VENTRICULAR-PERITONEAL (V-P) SHUNT;  Surgeon: Quentin Ore, MD;  Location: [facility] OR;  Service: General;  Laterality: N/A;   PACEMAKER INSERTION  2012   St Jude   RADIOLOGY WITH ANESTHESIA N/A 01/26/2022   Procedure: IR WITH ANESTHESIA;  Surgeon: Lisbeth Renshaw, MD;  Location: [facility] OR;  Service: Radiology;  Laterality: N/A;   VENTRICULOPERITONEAL SHUNT N/A 04/22/2022   Procedure: Laparoscopic assisted VP shunt placement;  Surgeon: Lisbeth Renshaw, MD;  Location: [facility] OR;  Service: Neurosurgery;  Laterality: N/A;  RM 18    Current  Outpatient Medications  Medication Sig Dispense Refill   acetaminophen (TYLENOL) 325 MG tablet Take 2 tablets (650 mg total) by mouth every 6 (six) hours as needed for mild pain (or temp > 37.5 C (99.5 F)). 60 tablet 0   aspirin 81 MG chewable tablet Chew 81 mg by mouth daily.     carvedilol (COREG) 12.5 MG tablet Take 1 tablet (12.5 mg total) by mouth 2 (two) times daily with a meal. 180 tablet 3   Evolocumab (REPATHA SURECLICK) 140 MG/ML SOAJ Inject 140 mg into the skin every 14 (fourteen) days. 2 mL 11   fenofibrate 160 MG tablet TAKE 1 TABLET(160 MG) BY MOUTH DAILY 90 tablet 3   fish oil-omega-3 fatty acids 1000 MG capsule Take 3 g by mouth daily.     losartan (COZAAR) 50 MG tablet TAKE 1 TABLET(50 MG) BY MOUTH DAILY (Patient taking differently: Take 100 mg by mouth daily.) 90 tablet 3   Multiple Vitamins-Minerals (MENS MULTIVITAMIN) TABS Take 1 tablet by mouth daily.     nitroGLYCERIN (NITROSTAT) 0.4 MG SL tablet Place 1 tablet (0.4 mg total) under the tongue every 5 (five) minutes as needed for chest pain. Up to 3 doses 25 tablet 2   sodium chloride 1 g tablet Take 1 tablet (1 g total) by mouth 3 (three) times daily with meals. 6 tablet 0   No current facility-administered medications for this visit.    Allergies:   Isosorbide dinitrate, Rosuvastatin, and Atorvastatin   Social History:  The patient  reports that he has been smoking cigarettes. He started smoking about 47 years ago. He has a 8.5 pack-year smoking history. He has never used smokeless tobacco. He reports current alcohol use. He reports that he does not use drugs.   Family History:  The patient's family history includes Aneurysm in his mother.  ROS:  Please see the history of present illness.  All other systems are reviewed and otherwise negative.   PHYSICAL EXAM:  VS:  There were no vitals taken for this visit. BMI: There is no height or weight on file to calculate BMI. Well nourished, well developed, in no acute  distress  HEENT: normocephalic, atraumatic  Neck: no JVD, carotid bruits or masses Cardiac:  RRR; no significant murmurs, no rubs, or gallops Lungs:  CTA b/l, no wheezing, rhonchi or rales  Abd: soft, nontender MS: no deformity or atrophy Ext: no edema  Skin: warm and dry, no rash Neuro:  No gross deficits appreciated Psych: euthymic mood, full affect  ICD site is stable, no tethering or discomfort  EKG:  Done today and reviewed by myself SR 65bpm, LAD,  LBBB, no acute changes appreciated  ICD interrogation done today and reviewed by myself:  Battery and lead measurements are stable One treated event was in VF zone By morphology looks ST > MMVT in VF zone > ATP >  clean break   05/08/15: TTE Study Conclusions  - Left ventricle: The cavity size was mildly dilated. Wall   thickness was normal. Systolic function was moderately to   severely reduced. The estimated ejection fraction was in the   range of 30% to 35%. Anteroseptal akinesis, mid to apical   inferoseptal akinesis, apical anterior akinesis, apical inferior   akinesis, akinesis of the true apex. Doppler parameters are   consistent with abnormal left ventricular relaxation (grade 1   diastolic dysfunction). - Aortic valve: There was no stenosis. - Aorta: Mildly dilated aortic root. Aortic root dimension: 38 mm   (ED). - Mitral valve: There was trivial regurgitation. - Left atrium: The atrium was mildly dilated. - Right ventricle: The cavity size was normal. Pacer wire or   catheter noted in right ventricle. Systolic function was normal. - Tricuspid valve: Peak RV-RA gradient (S): 23 mm Hg. - Pulmonary arteries: PA peak pressure: 31 mm Hg (S). - Systemic veins: IVC measured 2.4 cm with normal respirophasic   variation, suggesting RA pressure 8 mmHg. Impressions: - Mildly dilated LV. EF 30-35%. Wall motion abnormalities as noted   above in the distribution of LAD infarction. Normal RV size and   systolic function. No  significant valvular abnormalities.     Recent Labs: 10/10/2022: BUN 14; Creatinine, Ser 0.82; Hemoglobin 15.9; Platelets 226; Potassium 4.4; Sodium 143  10/10/2022: Chol/HDL Ratio 4.7; Cholesterol, Total 235; HDL 50; LDL Chol Calc (NIH) 115; Triglycerides 404   CrCl cannot be calculated (Patient's most recent lab result is older than the maximum 21 days allowed.).   Wt Readings from Last 3 Encounters:  10/27/22 257 lb (116.6 kg)  07/22/22 225 lb (102.1 kg)  06/30/22 225 lb (102.1 kg)     Other studies reviewed: Additional studies/records reviewed today include: summarized above  ASSESSMENT AND PLAN:  1. ICD     Intact function     No programming changes made   2. ICM 3. Chronic CHF (systolic)     Well compensated     CorVue looks OK     On BB/ARB   4. CAD     He is having some exertional symptoms, denies CP     Seems to have all started about the time or at least since he had gotten so sick (?)   5. VT     MMVT, very fast Increase his coreg 25mg  BID (and reduce his losartan to 25mg  daily) Echo Stress PET ASAP No rest symptoms He well overdue for his gen cards team  Discussed if his symptoms seem to escalate in anyway to seek attention If we are unable to get his PET done in the next week or so, will need to consider alternative ischemic evaluation  6. HTN     He has been taking losartan BID (says that is what his home bottle says to do)     Has room for more coreg   Informed Consent   Shared Decision Making/Informed Consent The risks [chest pain, shortness of breath, cardiac arrhythmias, dizziness, blood pressure fluctuations, myocardial infarction, stroke/transient ischemic attack, nausea, vomiting, allergic reaction, radiation exposure, metallic taste sensation and life-threatening complications (estimated to be 1 in 10,000)], benefits (risk stratification, diagnosing coronary artery disease, treatment guidance) and alternatives of a cardiac PET stress test were  discussed in detail with Mr. Nannini and he agrees to proceed.        Disposition: Dr. Georgiann Mohs cards APP in 4 weeks or so, EP again in 2  mo sooner if needed    Current medicines are reviewed at length with the patient today.  The patient did not have any concerns regarding medicines.  Judith Blonder, PA-C 03/05/2023 12:14 PM     [facility] [address] [place] [address] [phone] (office)  [phone] (fax)

## 2023-03-05 NOTE — Telephone Encounter (Signed)
 No change in treatment.

## 2023-03-10 ENCOUNTER — Ambulatory Visit: Payer: Medicare HMO | Attending: Physician Assistant | Admitting: Physician Assistant

## 2023-03-10 ENCOUNTER — Telehealth (HOSPITAL_COMMUNITY): Payer: Self-pay | Admitting: Emergency Medicine

## 2023-03-10 ENCOUNTER — Encounter (HOSPITAL_COMMUNITY): Payer: Self-pay

## 2023-03-10 ENCOUNTER — Encounter: Payer: Self-pay | Admitting: Physician Assistant

## 2023-03-10 VITALS — BP 134/78 | HR 63 | Ht 74.0 in | Wt 267.2 lb

## 2023-03-10 DIAGNOSIS — I5022 Chronic systolic (congestive) heart failure: Secondary | ICD-10-CM | POA: Diagnosis not present

## 2023-03-10 DIAGNOSIS — I2510 Atherosclerotic heart disease of native coronary artery without angina pectoris: Secondary | ICD-10-CM

## 2023-03-10 DIAGNOSIS — I10 Essential (primary) hypertension: Secondary | ICD-10-CM | POA: Diagnosis not present

## 2023-03-10 DIAGNOSIS — I472 Ventricular tachycardia, unspecified: Secondary | ICD-10-CM | POA: Diagnosis not present

## 2023-03-10 DIAGNOSIS — Z95810 Presence of automatic (implantable) cardiac defibrillator: Secondary | ICD-10-CM | POA: Diagnosis not present

## 2023-03-10 DIAGNOSIS — I200 Unstable angina: Secondary | ICD-10-CM

## 2023-03-10 DIAGNOSIS — I255 Ischemic cardiomyopathy: Secondary | ICD-10-CM

## 2023-03-10 DIAGNOSIS — I2089 Other forms of angina pectoris: Secondary | ICD-10-CM

## 2023-03-10 LAB — CUP PACEART INCLINIC DEVICE CHECK
Battery Remaining Longevity: 118 mo
Brady Statistic RV Percent Paced: 0.08 %
Date Time Interrogation Session: 20250225130342
HighPow Impedance: 64.125
Implantable Lead Connection Status: 753985
Implantable Lead Implant Date: 20121227
Implantable Lead Location: 753860
Implantable Lead Model: 7122
Implantable Pulse Generator Implant Date: 20240617
Lead Channel Impedance Value: 400 Ohm
Lead Channel Pacing Threshold Amplitude: 0.75 V
Lead Channel Pacing Threshold Amplitude: 0.75 V
Lead Channel Pacing Threshold Pulse Width: 0.5 ms
Lead Channel Pacing Threshold Pulse Width: 0.5 ms
Lead Channel Sensing Intrinsic Amplitude: 11.5 mV
Lead Channel Setting Pacing Amplitude: 2.5 V
Lead Channel Setting Pacing Pulse Width: 0.5 ms
Lead Channel Setting Sensing Sensitivity: 0.5 mV
Pulse Gen Serial Number: 111072390
Zone Setting Status: 755011

## 2023-03-10 LAB — CBC

## 2023-03-10 MED ORDER — CARVEDILOL 25 MG PO TABS: 25.0000 mg | ORAL_TABLET | Freq: Two times a day (BID) | ORAL | 3 refills | Status: DC

## 2023-03-10 MED ORDER — LOSARTAN POTASSIUM 50 MG PO TABS: ORAL_TABLET | ORAL | 3 refills | Status: DC

## 2023-03-10 NOTE — Patient Instructions (Addendum)
 Medication Instructions:   START TAKING : COREG 25 MG  TWICE   A DAY   *If you need a refill on your cardiac medications before your next appointment, please call your pharmacy*   Lab Work:   PLEASE GO DOWN STAIRS  [facility]  FIRST FLOOR  [address] ( GET OFF ELEVATORS MAKE A LEFT AND ANOTHER LEFT LAB ON RIGHT DOWN HALLWAY : BMET  MAG AND CBC TODAY     If you have labs (blood work) drawn today and your tests are completely normal, you will receive your results only by: MyChart Message (if you have MyChart) OR A paper copy in the mail If you have any lab test that is abnormal or we need to change your treatment, we will call you to review the results.   Testing/Procedures: Your physician has requested that you have an echocardiogram. Echocardiography is a painless test that uses sound waves to create images of your heart. It provides your doctor with information about the size and shape of your heart and how well your heart's chambers and valves are working. This procedure takes approximately one hour. There are no restrictions for this procedure. Please do NOT wear cologne, perfume, aftershave, or lotions (deodorant is allowed). Please arrive 15 minutes prior to your appointment time.  Please note: We ask at that you not bring children with you during ultrasound (echo/ vascular) testing. Due to room size and safety concerns, children are not allowed in the ultrasound rooms during exams. Our front office staff cannot provide observation of children in our lobby area while testing is being conducted. An adult accompanying a patient to their appointment will only be allowed in the ultrasound room at the discretion of the ultrasound technician under special circumstances. We apologize for any inconvenience.  Non-Cardiac CT scanning, (CAT scanning), is a noninvasive, special x-ray that produces cross-sectional images of the body using x-rays and a computer. CT scans help physicians diagnose and treat  medical conditions. For some CT exams, a contrast material is used to enhance visibility in the area of the body being studied. CT scans provide greater clarity and reveal more details than regular x-ray exams.   Follow-Up: At [facility], you and your health needs are our priority.  As part of our continuing mission to provide you with exceptional heart care, we have created designated Provider Care Teams.  These Care Teams include your primary Cardiologist (physician) and Advanced Practice Providers (APPs -  Physician Assistants and Nurse Practitioners) who all work together to provide you with the care you need, when you need it.  We recommend signing up for the patient portal called "MyChart".  Sign up information is provided on this After Visit Summary.  MyChart is used to connect with patients for Virtual Visits (Telemedicine).  Patients are able to view lab/test results, encounter notes, upcoming appointments, etc.  Non-urgent messages can be sent to your provider as well.   To learn more about what you can do with MyChart, go to [email or web address].    Your next appointment:  MUST SEE DR TURNER 4-6 WEEKS NO EXCEPTIONS  ( MESSAGE NURSE)  2 -3  month(s)  ( CONTACT  CASSIE HALL/ ANGELINE HAMMER FOR EP SCHEDULING ISSUES )\  Provider:    Francis Dowse, PA-C    Other Instructions   1st Floor: - Lobby - Registration  - Pharmacy  - Lab - Cafe  2nd Floor: - PV Lab - Diagnostic Testing (echo, CT, nuclear med)  3rd  Floor: - Vacant  4th Floor: - TCTS (cardiothoracic surgery) - AFib Clinic - Structural Heart Clinic - Vascular Surgery  - Vascular Ultrasound  5th Floor: - HeartCare Cardiology (general and EP) - Clinical Pharmacy for coumadin, hypertension, lipid, weight-loss medications, and med management appointments    Valet parking services will be available as well.

## 2023-03-10 NOTE — Telephone Encounter (Signed)
 Reaching out to patient to offer assistance regarding upcoming cardiac imaging study; pt verbalizes understanding of appt date/time, parking situation and where to check in, pre-test NPO status and medications ordered, and verified current allergies; name and call back number provided for further questions should they arise Rockwell Alexandria RN Navigator Cardiac Imaging [facility] [phone] office [phone] cell

## 2023-03-11 ENCOUNTER — Ambulatory Visit (HOSPITAL_COMMUNITY)
Admission: RE | Admit: 2023-03-11 | Discharge: 2023-03-11 | Disposition: A | Discharge status: Home / Self Care | Payer: Medicare HMO | Source: Ambulatory Visit | Attending: Physician Assistant | Admitting: Physician Assistant

## 2023-03-11 DIAGNOSIS — I200 Unstable angina: Secondary | ICD-10-CM | POA: Diagnosis not present

## 2023-03-11 LAB — BASIC METABOLIC PANEL
BUN/Creatinine Ratio: 20 (ref 10–24)
BUN: 16 mg/dL (ref 8–27)
CO2: 23 mmol/L (ref 20–29)
Calcium: 10.1 mg/dL (ref 8.6–10.2)
Chloride: 106 mmol/L (ref 96–106)
Creatinine, Ser: 0.81 mg/dL (ref 0.76–1.27)
Glucose: 92 mg/dL (ref 70–99)
Potassium: 4.4 mmol/L (ref 3.5–5.2)
Sodium: 142 mmol/L (ref 134–144)
eGFR: 98 mL/min/{1.73_m2} (ref >59)

## 2023-03-11 LAB — CBC
Hematocrit: 47 % (ref 37.5–51.0)
Hemoglobin: 15.7 g/dL (ref 13.0–17.7)
MCH: 29.9 pg (ref 26.6–33.0)
MCHC: 33.4 g/dL (ref 31.5–35.7)
MCV: 90 fL (ref 79–97)
Platelets: 256 10*3/uL (ref 150–450)
RBC: 5.25 x10E6/uL (ref 4.14–5.80)
RDW: 13 % (ref 11.6–15.4)
WBC: 8.2 10*3/uL (ref 3.4–10.8)

## 2023-03-11 LAB — MAGNESIUM: Magnesium: 2 mg/dL (ref 1.6–2.3)

## 2023-03-11 MED ORDER — RUBIDIUM RB82 GENERATOR (RUBYFILL)
30.0000 | PACK | Freq: Once | INTRAVENOUS | Status: AC
  Administered 2023-03-11: 30.01 via INTRAVENOUS

## 2023-03-11 MED ORDER — REGADENOSON 0.4 MG/5ML IV SOLN
INTRAVENOUS | Status: AC
  Filled 2023-03-11: qty 5

## 2023-03-11 MED ORDER — REGADENOSON 0.4 MG/5ML IV SOLN
0.4000 mg | Freq: Once | INTRAVENOUS | Status: AC
  Administered 2023-03-11: 0.4 mg via INTRAVENOUS

## 2023-03-11 MED ORDER — RUBIDIUM RB82 GENERATOR (RUBYFILL)
30.0000 | PACK | Freq: Once | INTRAVENOUS | Status: AC
  Administered 2023-03-11: 29.99 via INTRAVENOUS

## 2023-03-12 LAB — NM PET CT CARDIAC PERFUSION MULTI W/ABSOLUTE BLOODFLOW
MBFR: 2.02
Nuc Rest EF: 20 %
Nuc Stress EF: 20 %
Peak HR: 90 {beats}/min
Rest HR: 69 {beats}/min
Rest MBF: 0.6 ml/g/min
Rest Nuclear Isotope Dose: 30 mCi
ST Depression (mm): 0 mm
Stress MBF: 1.21 ml/g/min
Stress Nuclear Isotope Dose: 30 mCi
TID: 1.03

## 2023-03-20 DIAGNOSIS — I25118 Atherosclerotic heart disease of native coronary artery with other forms of angina pectoris: Secondary | ICD-10-CM | POA: Diagnosis not present

## 2023-03-27 ENCOUNTER — Ambulatory Visit (HOSPITAL_COMMUNITY): Payer: Medicare HMO | Attending: Cardiovascular Disease

## 2023-03-27 DIAGNOSIS — F17210 Nicotine dependence, cigarettes, uncomplicated: Secondary | ICD-10-CM | POA: Insufficient documentation

## 2023-03-27 DIAGNOSIS — I2510 Atherosclerotic heart disease of native coronary artery without angina pectoris: Secondary | ICD-10-CM | POA: Insufficient documentation

## 2023-03-27 DIAGNOSIS — I252 Old myocardial infarction: Secondary | ICD-10-CM | POA: Insufficient documentation

## 2023-03-27 DIAGNOSIS — I110 Hypertensive heart disease with heart failure: Secondary | ICD-10-CM | POA: Diagnosis not present

## 2023-03-27 DIAGNOSIS — R079 Chest pain, unspecified: Secondary | ICD-10-CM | POA: Insufficient documentation

## 2023-03-27 DIAGNOSIS — G4730 Sleep apnea, unspecified: Secondary | ICD-10-CM | POA: Insufficient documentation

## 2023-03-27 DIAGNOSIS — Z95810 Presence of automatic (implantable) cardiac defibrillator: Secondary | ICD-10-CM | POA: Diagnosis not present

## 2023-03-27 DIAGNOSIS — E785 Hyperlipidemia, unspecified: Secondary | ICD-10-CM | POA: Insufficient documentation

## 2023-03-27 DIAGNOSIS — I472 Ventricular tachycardia, unspecified: Secondary | ICD-10-CM | POA: Insufficient documentation

## 2023-03-27 DIAGNOSIS — I255 Ischemic cardiomyopathy: Secondary | ICD-10-CM | POA: Insufficient documentation

## 2023-03-27 DIAGNOSIS — I509 Heart failure, unspecified: Secondary | ICD-10-CM | POA: Insufficient documentation

## 2023-03-27 LAB — ECHOCARDIOGRAM COMPLETE
Area-P 1/2: 3.31 cm2
S' Lateral: 5.4 cm

## 2023-03-27 MED ORDER — PERFLUTREN LIPID MICROSPHERE
1.0000 mL | INTRAVENOUS | Status: AC | PRN
Start: 2023-03-27 — End: 2023-03-27
  Administered 2023-03-27 (×2): 2 mL via INTRAVENOUS

## 2023-03-30 ENCOUNTER — Ambulatory Visit (INDEPENDENT_AMBULATORY_CARE_PROVIDER_SITE_OTHER): Payer: Medicare HMO

## 2023-03-30 DIAGNOSIS — I255 Ischemic cardiomyopathy: Secondary | ICD-10-CM | POA: Diagnosis not present

## 2023-03-30 DIAGNOSIS — I5022 Chronic systolic (congestive) heart failure: Secondary | ICD-10-CM

## 2023-03-31 ENCOUNTER — Encounter: Payer: Self-pay | Admitting: Internal Medicine

## 2023-03-31 LAB — CUP PACEART REMOTE DEVICE CHECK
Battery Remaining Longevity: 113 mo
Battery Remaining Percentage: 92 %
Battery Voltage: 3.04 V
Brady Statistic RV Percent Paced: 1 %
Date Time Interrogation Session: 20250316221215
HighPow Impedance: 60 Ohm
Implantable Lead Connection Status: 753985
Implantable Lead Implant Date: 20121227
Implantable Lead Location: 753860
Implantable Lead Model: 7122
Implantable Pulse Generator Implant Date: 20240617
Lead Channel Impedance Value: 350 Ohm
Lead Channel Pacing Threshold Amplitude: 0.75 V
Lead Channel Pacing Threshold Pulse Width: 0.5 ms
Lead Channel Sensing Intrinsic Amplitude: 11.5 mV
Lead Channel Setting Pacing Amplitude: 2.5 V
Lead Channel Setting Pacing Pulse Width: 0.5 ms
Lead Channel Setting Sensing Sensitivity: 0.5 mV
Pulse Gen Serial Number: 111072390
Zone Setting Status: 755011

## 2023-04-28 DIAGNOSIS — J209 Acute bronchitis, unspecified: Secondary | ICD-10-CM | POA: Diagnosis not present

## 2023-04-28 DIAGNOSIS — J309 Allergic rhinitis, unspecified: Secondary | ICD-10-CM | POA: Diagnosis not present

## 2023-05-08 NOTE — Progress Notes (Signed)
 Cardiology Office Note Date:  05/08/2023  Patient ID:  Joe [name], DOB 1955-05-16, MRN [id number] PCP:  Ruven Coy, MD  Cardiologist:  Dr. Micael Adas Electrophysiologist: Dr. Carolynne Citron    Chief Complaint:    planned f/u  History of Present Illness: Joe [name] is a 68 y.o. male with history of ICM w/ICD, chronic CHF (systolic), HTN, CAD w/PCI to LAD 2011 (complicated by VF arrest).   Hx of intracranial hemorrhage  He saw Dr. Carolynne Citron 10/27/22, doing well, well healed from gen change.  Class II symptoms, no indication for device upgrade  Device clinic alert 03/02/23, VT in VF zone, 02/27/13, treated w/ATP, reported medication compliance, perhaps a sense of anxiety, asymptomatic otherwise, recovering from a PNA  I saw him 03/10/23 Generally had been doing OK Had a terrible cold >> then PNA syndrome, lasted several weeks, ears were all congested as well ended up with 3 rounds of antibiotics, finally perhaps getting past in in the last 2 weeks For the last 5-6 weeks not feeling as well as usual Walking to the end of the driveway and back (a couple hundred yards) makes him feel "off" Feels "weird" perhaps No rest symptoms Not SOB, feels like perhaps BOTH arm feel weak maybe No CP, tightness, heaviness will sit for a few minutes and feels better The time of his "event" he did feel near syncope for a second, 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, did fine, at the time of the even was having intercourse No CP, felt perfect before and after No other near syncope No syncope By morphology looks ST > MMVT in VF zone > ATP > clean break  Coreg  increased (losartan  decreased) Planned for stress PET, echo  Advised early gen cards follow up  NO appointment with cardiology team was made at the time of this visit Echo  LVEF 25-30%, no WMA again advised gen cards follow up, no appt made yet  Stress PET with no ischemia, + infarct, EF 20%, no TID The radiologist over-read describes ongoing  inflammatory vs infections findings lungs Rec to follow up with his PMD please to revisit his post infection/viral management of this.   TODAY  He is doing better, unfortunately now with an ear infection! Busy/active Just finished planting his corn, squash, potatoes No dizzy spells, near syncope or syncope No SOB No CP   Device information: SJM single chamber ICD implanted 01/09/11, gen change 06/30/2022  Arrhythmia/AAD hx VT w/appropriate ATP successful Feb 2025  Past Medical History:  Diagnosis Date   Anemia    Chronic systolic CHF (congestive heart failure), NYHA class 1 (HCC)    EF 37% by nuclear stress test   Coronary artery disease    s/p PCI mild LAD (BMS) in setting of AWMI complicated by Vfib arrest with 40-50% residual disesae in left circ   History of kidney stones    Hyperlipidemia    Hypertension    Ischemic cardiomyopathy    A. 01/09/2011 - s/p St. Jude Fortify ST [employer] [place] AICD   Myocardial infarction (HCC)    OSA (obstructive sleep apnea)    severe with AHI 26/hr intolerant to CPAP   Pneumonia    Presence of permanent cardiac pacemaker    VF (ventricular fibrillation) (HCC)    arrest in setting of AMI 6/11    Past Surgical History:  Procedure Laterality Date   BACK SURGERY     BACK SURGERY     ICD GENERATOR CHANGEOUT N/A 06/30/2022   Procedure: ICD  GENERATOR CHANGEOUT;  Surgeon: Tammie Fall, MD;  Location: [facility] INVASIVE CV LAB;  Service: Cardiovascular;  Laterality: N/A;   IMPLANTABLE CARDIOVERTER DEFIBRILLATOR IMPLANT N/A 01/09/2011   Procedure: IMPLANTABLE CARDIOVERTER DEFIBRILLATOR IMPLANT;  Surgeon: Tammie Fall, MD;  Location: [facility] CATH LAB;  Service: Cardiovascular;  Laterality: N/A;   IR ANGIO INTRA EXTRACRAN SEL INTERNAL CAROTID BILAT MOD SED  01/26/2022   IR ANGIO VERTEBRAL SEL VERTEBRAL UNI R MOD SED  01/26/2022   IR ANGIO VERTEBRAL SEL VERTEBRAL UNI R MOD SED  07/22/2022   IR ANGIOGRAM FOLLOW UP STUDY  01/26/2022   IR NEURO EACH ADD'L AFTER BASIC  UNI LEFT (MS)  01/26/2022   IR TRANSCATH/EMBOLIZ  01/26/2022   IR US  GUIDE VASC ACCESS RIGHT  07/22/2022   KNEE ARTHROSCOPY     right   LAPAROSCOPIC REVISION VENTRICULAR-PERITONEAL (V-P) SHUNT N/A 04/22/2022   Procedure: LAPAROSCOPIC VENTRICULAR-PERITONEAL (V-P) SHUNT;  Surgeon: Junie Olds, MD;  Location: [facility] OR;  Service: General;  Laterality: N/A;   PACEMAKER INSERTION  2012   St Jude   RADIOLOGY WITH ANESTHESIA N/A 01/26/2022   Procedure: IR WITH ANESTHESIA;  Surgeon: Augusto Blonder, MD;  Location[facility] OR;  Service: Radiology;  Laterality: N/A;   VENTRICULOPERITONEAL SHUNT N/A 04/22/2022   Procedure: Laparoscopic assisted VP shunt placement;  Surgeon: Augusto Blonder, MD;  Location: [facility] OR;  Service: Neurosurgery;  Laterality: N/A;  RM 18    Current Outpatient Medications  Medication Sig Dispense Refill   acetaminophen  (TYLENOL ) 325 MG tablet Take 2 tablets (650 mg total) by mouth every 6 (six) hours as needed for mild pain (or temp > 37.5 C (99.5 F)). 60 tablet 0   aspirin  81 MG chewable tablet Chew 81 mg by mouth daily.     carvedilol  (COREG ) 25 MG tablet Take 1 tablet (25 mg total) by mouth 2 (two) times daily with a meal. 180 tablet 3   Evolocumab  (REPATHA  SURECLICK) 140 MG/ML SOAJ Inject 140 mg into the skin every 14 (fourteen) days. 2 mL 11   fenofibrate  160 MG tablet TAKE 1 TABLET(160 MG) BY MOUTH DAILY 90 tablet 3   fish oil-omega-3 fatty acids 1000 MG capsule Take 3 g by mouth daily.     losartan  (COZAAR ) 50 MG tablet TAKE 1 TABLET(50 MG) BY MOUTH DAILY 90 tablet 3   Multiple Vitamins-Minerals (MENS MULTIVITAMIN) TABS Take 1 tablet by mouth daily.     nitroGLYCERIN  (NITROSTAT ) 0.4 MG SL tablet Place 1 tablet (0.4 mg total) under the tongue every 5 (five) minutes as needed for chest pain. Up to 3 doses 25 tablet 2   sodium chloride  1 g tablet Take 1 tablet (1 g total) by mouth 3 (three) times daily with meals. 6 tablet 0   No current facility-administered medications for  this visit.    Allergies:   Isosorbide dinitrate, Rosuvastatin , and Atorvastatin   Social History:  The patient  reports that he has been smoking cigarettes. He started smoking about 47 years ago. He has a 8.5 pack-year smoking history. He has never used smokeless tobacco. He reports current alcohol use. He reports that he does not use drugs.   Family History:  The patient's family history includes Aneurysm in his mother.  ROS:  Please see the history of present illness.  All other systems are reviewed and otherwise negative.   PHYSICAL EXAM:  VS:  There were no vitals taken for this visit. BMI: There is no height or weight on file to calculate BMI. Well  nourished, well developed, in no acute distress  HEENT: normocephalic, atraumatic  Neck: no JVD, carotid bruits or masses Cardiac:  RRR; no significant murmurs, no rubs, or gallops Lungs: CTA b/l, no wheezing, rhonchi or rales  Abd: soft, nontender MS: no deformity or atrophy Ext: no edema  Skin: warm and dry, no rash Neuro:  No gross deficits appreciated Psych: euthymic mood, full affect  ICD site is stable, no tethering or discomfort  EKG:  not done today  ICD interrogation done today and reviewed by myself:  Battery and lead measurements are stable No arrhythmias VP 1%  03/12/23: stress PET   Findings are consistent with infarction. No ischemia. The study is intermediate risk due to infarction and reduced ejection fraction.   LV perfusion is abnormal. There is no evidence of ischemia. There is evidence of infarction. Defect 1: There is a large defect with severe reduction in uptake present in the apical apex location(s) that is fixed. Mild fixed perfusion defect in the mid LV in the septal and inferior walls. There is abnormal wall motion in the defect area. Consistent with infarction.   Rest left ventricular function is abnormal. Rest global function is severely reduced. Rest EF: 20%. Stress left ventricular function is  abnormal. Stress global function is severely reduced. Stress EF: 20%. End diastolic cavity size is severely enlarged. End systolic cavity size is severely enlarged. No evidence of transient ischemic dilation (TID) noted.   Myocardial blood flow was computed to be 0.60ml/g/min at rest and 1.21ml/g/min at stress. Global myocardial blood flow reserve was 2.02 and was normal.   Coronary calcium  assessment not performed due to prior revascularization.   03/27/23: TTE 1. Septal apical and distal anterior/inferior wall akinesis . Left  ventricular ejection fraction, by estimation, is 25 to 30%. The left  ventricle has severely decreased function. The left ventricle has no  regional wall motion abnormalities. The left  ventricular internal cavity size was severely dilated. Left ventricular  diastolic parameters are consistent with Grade II diastolic dysfunction  (pseudonormalization). Elevated left ventricular end-diastolic pressure.   2. Device leads in RA/RV. Right ventricular systolic function is normal.  The right ventricular size is normal.   3. Left atrial size was moderately dilated.   4. The mitral valve is normal in structure. Trivial mitral valve  regurgitation. No evidence of mitral stenosis.   5. The aortic valve is tricuspid. There is mild calcification of the  aortic valve. There is mild thickening of the aortic valve. Aortic valve  regurgitation is not visualized. Aortic valve sclerosis is present, with  no evidence of aortic valve stenosis.   6. The inferior vena cava is normal in size with greater than 50%  respiratory variability, suggesting right atrial pressure of 3 mmHg.     05/08/15: TTE Study Conclusions  - Left ventricle: The cavity size was mildly dilated. Wall   thickness was normal. Systolic function was moderately to   severely reduced. The estimated ejection fraction was in the   range of 30% to 35%. Anteroseptal akinesis, mid to apical   inferoseptal akinesis,  apical anterior akinesis, apical inferior   akinesis, akinesis of the true apex. Doppler parameters are   consistent with abnormal left ventricular relaxation (grade 1   diastolic dysfunction). - Aortic valve: There was no stenosis. - Aorta: Mildly dilated aortic root. Aortic root dimension: 38 mm   (ED). - Mitral valve: There was trivial regurgitation. - Left atrium: The atrium was mildly dilated. - Right ventricle: The  cavity size was normal. Pacer wire or   catheter noted in right ventricle. Systolic function was normal. - Tricuspid valve: Peak RV-RA gradient (S): 23 mm Hg. - Pulmonary arteries: PA peak pressure: 31 mm Hg (S). - Systemic veins: IVC measured 2.4 cm with normal respirophasic   variation, suggesting RA pressure 8 mmHg. Impressions: - Mildly dilated LV. EF 30-35%. Wall motion abnormalities as noted   above in the distribution of LAD infarction. Normal RV size and   systolic function. No significant valvular abnormalities.     Recent Labs: 03/10/2023: BUN 16; Creatinine, Ser 0.81; Hemoglobin 15.7; Magnesium 2.0; Platelets 256; Potassium 4.4; Sodium 142  10/10/2022: Chol/HDL Ratio 4.7; Cholesterol, Total 235; HDL 50; LDL Chol Calc (NIH) 115; Triglycerides 404   CrCl cannot be calculated (Patient's most recent lab result is older than the maximum 21 days allowed.).   Wt Readings from Last 3 Encounters:  03/10/23 267 lb 3.2 oz (121.2 kg)  10/27/22 257 lb (116.6 kg)  07/22/22 225 lb (102.1 kg)     Other studies reviewed: Additional studies/records reviewed today include: summarized above  ASSESSMENT AND PLAN:  1. ICD     Intact function      No programming changes made   2. ICM 3. Chronic CHF (systolic)     Well compensated     Cor Vue is down though trending back up     On BB/ARB  Change losartan  to Entresto Needs to see gen cards team for follow up/labs   4. CAD     No ongoing symptoms     On ASA, BB, repatha , and fenofibrate   5. VT   None  further  Discussed no driving 6 mo (from date of ATP)  6. HTN     Looks ok      Disposition: remotes as usual, 6 mo with EP team, sooner if needed    Current medicines are reviewed at length with the patient today.  The patient did not have any concerns regarding medicines.  Joe Formica, PA-C 05/08/2023 5:47 PM     [facility] [address] [place] [address] [phone] (office)  [phone] (fax)

## 2023-05-11 ENCOUNTER — Ambulatory Visit: Payer: Medicare HMO | Attending: Physician Assistant | Admitting: Physician Assistant

## 2023-05-11 ENCOUNTER — Encounter: Payer: Self-pay | Admitting: Physician Assistant

## 2023-05-11 VITALS — BP 126/80 | HR 72 | Ht 74.0 in | Wt 272.5 lb

## 2023-05-11 DIAGNOSIS — I10 Essential (primary) hypertension: Secondary | ICD-10-CM

## 2023-05-11 DIAGNOSIS — I5022 Chronic systolic (congestive) heart failure: Secondary | ICD-10-CM

## 2023-05-11 DIAGNOSIS — I255 Ischemic cardiomyopathy: Secondary | ICD-10-CM

## 2023-05-11 DIAGNOSIS — I2510 Atherosclerotic heart disease of native coronary artery without angina pectoris: Secondary | ICD-10-CM

## 2023-05-11 DIAGNOSIS — I472 Ventricular tachycardia, unspecified: Secondary | ICD-10-CM

## 2023-05-11 DIAGNOSIS — Z95810 Presence of automatic (implantable) cardiac defibrillator: Secondary | ICD-10-CM

## 2023-05-11 LAB — CUP PACEART INCLINIC DEVICE CHECK
Battery Remaining Longevity: 115 mo
Brady Statistic RV Percent Paced: 0.04 %
Date Time Interrogation Session: 20250428095849
HighPow Impedance: 61.875
Implantable Lead Connection Status: 753985
Implantable Lead Implant Date: 20121227
Implantable Lead Location: 753860
Implantable Lead Model: 7122
Implantable Pulse Generator Implant Date: 20240617
Lead Channel Impedance Value: 387.5 Ohm
Lead Channel Pacing Threshold Amplitude: 1 V
Lead Channel Pacing Threshold Amplitude: 1 V
Lead Channel Pacing Threshold Pulse Width: 0.5 ms
Lead Channel Pacing Threshold Pulse Width: 0.5 ms
Lead Channel Sensing Intrinsic Amplitude: 11.5 mV
Lead Channel Setting Pacing Amplitude: 2.5 V
Lead Channel Setting Pacing Pulse Width: 0.5 ms
Lead Channel Setting Sensing Sensitivity: 0.5 mV
Pulse Gen Serial Number: 111072390
Zone Setting Status: 755011

## 2023-05-11 MED ORDER — ENTRESTO 24-26 MG PO TABS: 1.0000 | ORAL_TABLET | Freq: Two times a day (BID) | ORAL | 2 refills | Status: DC

## 2023-05-11 NOTE — Patient Instructions (Addendum)
 Medication Instructions:   START TAKING:  ENTRESTO 24-26 TWICE A DAY   STOP TAKING AND REMOVE THIS MEDICATION FROM YOUR MEDICATION LIST:  LOSARTAN     *If you need a refill on your cardiac medications before your next appointment, please call your pharmacy*   Lab Work:  NONE ORDERED  TODAY    If you have labs (blood work) drawn today and your tests are completely normal, you will receive your results only by: MyChart Message (if you have MyChart) OR A paper copy in the mail If you have any lab test that is abnormal or we need to change your treatment, we will call you to review the results.   Testing/Procedures: NONE ORDERED  TODAY    Follow-Up: At [facility], you and your health needs are our priority.  As part of our continuing mission to provide you with exceptional heart care, our providers are all part of one team.  This team includes your primary Cardiologist (physician) and Advanced Practice Providers or APPs (Physician Assistants and Nurse Practitioners) who all work together to provide you with the care you need, when you need it.  Your next appointment:  NEXT AVAILABLE WITH DR TURNER/ APP ASAP   6 month(s)  Provider:    You may see Manya Sells, MD or one of the following Advanced Practice Providers on your designated Care Team:   Mertha Abrahams, [place]  We recommend signing up for the patient portal called "MyChart".  Sign up information is provided on this After Visit Summary.  MyChart is used to connect with patients for Virtual Visits (Telemedicine).  Patients are able to view lab/test results, encounter notes, upcoming appointments, etc.  Non-urgent messages can be sent to your provider as well.   To learn more about what you can do with MyChart, go to [email or web address].   Other Instructions

## 2023-05-14 ENCOUNTER — Telehealth: Payer: Self-pay | Admitting: Internal Medicine

## 2023-05-14 NOTE — Telephone Encounter (Signed)
 Pt c/o medication issue:  1. Name of Medication: sacubitril-valsartan  (ENTRESTO ) 24-26 MG   2. How are you currently taking this medication (dosage and times per day)?    3. Are you having a reaction (difficulty breathing--STAT)? no  4. What is your medication issue? Patient states the medication is too expensive. Please advise

## 2023-05-14 NOTE — Telephone Encounter (Signed)
 Spoke with patient and he states  Entresto  is too expensive and he would like to see if there is any assistance programs for entresto 

## 2023-05-15 ENCOUNTER — Telehealth: Payer: Self-pay | Admitting: Pharmacy Technician

## 2023-05-15 ENCOUNTER — Other Ambulatory Visit (HOSPITAL_COMMUNITY): Payer: Self-pay

## 2023-05-15 NOTE — Progress Notes (Signed)
 Remote ICD transmission.

## 2023-05-15 NOTE — Addendum Note (Signed)
 Addended by: Edra Govern D on: 05/15/2023 04:55 PM   Modules accepted: Orders

## 2023-05-15 NOTE — Telephone Encounter (Signed)
 Patient Advocate Encounter   The patient was approved for a [employer] grant that will help cover the cost of ENTRESTO  Total amount awarded, 4500.  Effective: 04/15/23 - 04/13/24   [id number] [id number] [id number] [employer] ID: [id number]   Pharmacy provided with approval and processing information. Patient informed via TELEPHONE   I called the patient and he just purchased the entresto . [facility] said they will refund him if he brings in the receipt. Patient said he will go and do that.

## 2023-05-15 NOTE — Telephone Encounter (Signed)
 Patient Advocate Encounter   The patient was approved for a [employer] grant that will help cover the cost of repatha  Total amount awarded, 2500.00.  Effective: 04/15/23 - 04/13/24   [id number] [id number] [employer] ID: [id number]   Pharmacy provided with approval and processing information. Patient informed via telephone    I called the patient and he just purchased the repatha . [facility] said they will refund him if he brings in the receipt. Patient said he will go and do that.   Also sent the patient in email the grant info for both

## 2023-06-10 ENCOUNTER — Ambulatory Visit: Attending: Cardiology | Admitting: Cardiology

## 2023-06-10 ENCOUNTER — Telehealth: Payer: Self-pay | Admitting: Pharmacy Technician

## 2023-06-10 VITALS — BP 131/80 | HR 82 | Ht 74.0 in | Wt 270.0 lb

## 2023-06-10 DIAGNOSIS — I255 Ischemic cardiomyopathy: Secondary | ICD-10-CM

## 2023-06-10 DIAGNOSIS — I2510 Atherosclerotic heart disease of native coronary artery without angina pectoris: Secondary | ICD-10-CM

## 2023-06-10 DIAGNOSIS — I5022 Chronic systolic (congestive) heart failure: Secondary | ICD-10-CM

## 2023-06-10 DIAGNOSIS — G4733 Obstructive sleep apnea (adult) (pediatric): Secondary | ICD-10-CM | POA: Diagnosis not present

## 2023-06-10 DIAGNOSIS — E785 Hyperlipidemia, unspecified: Secondary | ICD-10-CM | POA: Diagnosis not present

## 2023-06-10 MED ORDER — ENTRESTO 49-51 MG PO TABS
1.0000 | ORAL_TABLET | Freq: Two times a day (BID) | ORAL | 3 refills | Status: DC
Start: 2023-06-10 — End: 2023-09-18

## 2023-06-10 MED ORDER — EMPAGLIFLOZIN 10 MG PO TABS
10.0000 mg | ORAL_TABLET | Freq: Every day | ORAL | 3 refills | Status: DC
Start: 2023-06-10 — End: 2023-09-18

## 2023-06-10 MED ORDER — EMPAGLIFLOZIN 10 MG PO TABS: 10.0000 mg | ORAL_TABLET | Freq: Every day | ORAL | 0 refills | Status: AC

## 2023-06-10 NOTE — Addendum Note (Signed)
 Addended by: Delroy Fields on: 06/10/2023 09:11 AM   Modules accepted: Orders

## 2023-06-10 NOTE — Addendum Note (Signed)
 Addended by: Elyn Han on: 06/10/2023 09:45 AM   Modules accepted: Orders

## 2023-06-10 NOTE — Telephone Encounter (Signed)
 Patient called and said his jardiance was 200.00 something. I called and got [facility] to run the jardiance under same grant as entresto   Total amount awarded, 4500.  Effective: 04/15/23 - 04/13/24   [id number] [id number] [employer] ID: [id number]  Called pt and made him aware jardiance and entresto  now free

## 2023-06-10 NOTE — Progress Notes (Signed)
 Date:  06/10/2023   ID:  Zula Hitch, DOB 1955-08-03, MRN [id number]  Patient Location:  Home  Provider location:   [place]  PCP:  Ruven Coy, MD  Cardiologist:  Gaylyn Keas, MD  Electrophysiologist:  Manya Sells, MD   Chief Complaint:  OSA, CAD, CHF, lipids, DCM  History of Present Illness:    Joe [name] is a 68 y.o. male with a history of ASCAD, ischemic DCM with AICD, chronic systolic CHF c, dyslipidemia, obesity and OSA.  He is followed by Dr. Carolynne Citron for his ICD.    He also has a history of OSA but has not been using his PAP device because he did not tolerate the mask.    He is here today for followup and is doing well.  He denies any chest pain or pressure, SOB, DOE, PND, orthopnea, LE edema, dizziness, palpitations or syncope. He is compliant with his meds and is tolerating meds with no SE.    Prior CV studies:   The following studies were reviewed today:  2D echo 2025  Past Medical History:  Diagnosis Date   Anemia    Chronic systolic CHF (congestive heart failure), NYHA class 1 (HCC)    EF 37% by nuclear stress test   Coronary artery disease    s/p PCI mild LAD (BMS) in setting of AWMI complicated by Vfib arrest with 40-50% residual disesae in left circ   History of kidney stones    Hyperlipidemia    Hypertension    Ischemic cardiomyopathy    A. 01/09/2011 - s/p St[employer] Jude Fort[employer] ST [employer] [place] AICD   Myocardial infarction (HCC)    OSA (obstructive sleep apnea)    severe with AHI 26/hr intolerant to CPAP   Pneumonia    Presence of permanent cardiac pacemaker    VF (ventricular fibrillation) (HCC)    arrest in setting of AMI 6/11   Past Surgical History:  Procedure Laterality Date   BACK SURGERY     BACK SURGERY     ICD GENERATOR CHANGEOUT N/A 06/30/2022   Procedure: ICD GENERATOR CHANGEOUT;  Surgeon: Tammie Fall, MD;  Location: [facility] INVASIVE CV LAB;  Service: Cardiovascular;  Laterality: N/A;   IMPLANTABLE CARDIOVERTER DEFIBRILLATOR  IMPLANT N/A 01/09/2011   Procedure: IMPLANTABLE CARDIOVERTER DEFIBRILLATOR IMPLANT;  Surgeon: Tammie Fall, MD;  Location: [facility] CATH LAB;  Service: Cardiovascular;  Laterality: N/A;   IR ANGIO INTRA EXTRACRAN SEL INTERNAL CAROTID BILAT MOD SED  01/26/2022   IR ANGIO VERTEBRAL SEL VERTEBRAL UNI R MOD SED  01/26/2022   IR ANGIO VERTEBRAL SEL VERTEBRAL UNI R MOD SED  07/22/2022   IR ANGIOGRAM FOLLOW UP STUDY  01/26/2022   IR NEURO EACH ADD'L AFTER BASIC UNI LEFT (MS)  01/26/2022   IR TRANSCATH/EMBOLIZ  01/26/2022   IR US  GUIDE VASC ACCESS RIGHT  07/22/2022   KNEE ARTHROSCOPY     right   LAPAROSCOPIC REVISION VENTRICULAR-PERITONEAL (V-P) SHUNT N/A 04/22/2022   Procedure: LAPAROSCOPIC VENTRICULAR-PERITONEAL (V-P) SHUNT;  Surgeon: Junie Olds, MD;  Location: [facility] OR;  Service: General;  Laterality: N/A;   PACEMAKER INSERTION  2012   St Jude   RADIOLOGY WITH ANESTHESIA N/A 01/26/2022   Procedure: IR WITH ANESTHESIA;  Surgeon: Augusto Blonder, MD;  Location: [facility] OR;  Service: Radiology;  Laterality: N/A;   VENTRICULOPERITONEAL SHUNT N/A 04/22/2022   Procedure: Laparoscopic assisted VP shunt placement;  Surgeon: Augusto Blonder, MD;  Location: [facility] OR;  Service: Neurosurgery;  Laterality: N/A;  RM 18  Current Meds  Medication Sig   acetaminophen  (TYLENOL ) 325 MG tablet Take 2 tablets (650 mg total) by mouth every 6 (six) hours as needed for mild pain (or temp > 37.5 C (99.5 F)).   aspirin  81 MG chewable tablet Chew 81 mg by mouth daily.   carvedilol  (COREG ) 25 MG tablet Take 1 tablet (25 mg total) by mouth 2 (two) times daily with a meal.   Evolocumab  (REPATHA  SURECLICK) 140 MG/ML SOAJ Inject 140 mg into the skin every 14 (fourteen) days.   fenofibrate  160 MG tablet TAKE 1 TABLET(160 MG) BY MOUTH DAILY   fish oil-omega-3 fatty acids 1000 MG capsule Take 3 g by mouth daily.   Multiple Vitamins-Minerals (MENS MULTIVITAMIN) TABS Take 1 tablet by mouth daily.   nitroGLYCERIN  (NITROSTAT ) 0.4 MG SL  tablet Place 1 tablet (0.4 mg total) under the tongue every 5 (five) minutes as needed for chest pain. Up to 3 doses   sacubitril-valsartan  (ENTRESTO ) 24-26 MG Take 1 tablet by mouth 2 (two) times daily.     Allergies:   Isosorbide dinitrate, Rosuvastatin , and Atorvastatin   Social History   Tobacco Use   Smoking status: Every Day    Current packs/day: 0.00    Average packs/day: 0.3 packs/day for 34.0 years (8.5 ttl pk-yrs)    Types: Cigarettes    Start date: 06/14/1975    Last attempt to quit: 06/13/2009    Years since quitting: 14.0   Smokeless tobacco: Never   Tobacco comments:    Socially  Vaping Use   Vaping status: Never Used  Substance Use Topics   Alcohol use: Yes    Comment: occasional   Drug use: No     Family Hx: The patient's family history includes Aneurysm in his mother.  ROS:   Please see the history of present illness.    All other systems reviewed and are negative.   Labs/Other Tests and Data Reviewed:    Recent Labs: 03/10/2023: BUN 16; Creatinine, Ser 0.81; Hemoglobin 15.7; Magnesium 2.0; Platelets 256; Potassium 4.4; Sodium 142   Recent Lipid Panel Lab Results  Component Value Date/Time   CHOL 235 (H) 10/10/2022 08:25 AM   CHOL 243 (H) 03/15/2013 08:31 AM   TRIG 404 (H) 10/10/2022 08:25 AM   TRIG 245 (H) 03/15/2013 08:31 AM   HDL 50 10/10/2022 08:25 AM   HDL 53 03/15/2013 08:31 AM   CHOLHDL 4.7 10/10/2022 08:25 AM   CHOLHDL 2.7 07/11/2015 07:48 AM   LDLCALC 115 (H) 10/10/2022 08:25 AM   LDLCALC 141 (H) 03/15/2013 08:31 AM   LDLDIRECT 98.0 06/28/2014 07:51 AM    Wt Readings from Last 3 Encounters:  06/10/23 270 lb (122.5 kg)  05/11/23 272 lb 8 oz (123.6 kg)  03/10/23 267 lb 3.2 oz (121.2 kg)     Objective:    Vital Signs:  BP 131/80 (BP Location: Left Arm)   Pulse 82   Ht 6\' 2"  (1.88 m)   Wt 270 lb (122.5 kg)   SpO2 95%   BMI 34.67 kg/m   GEN: Well nourished, well developed in no acute distress HEENT: Normal NECK: No JVD; No  carotid bruits LYMPHATICS: No lymphadenopathy CARDIAC:RRR, no murmurs, rubs, gallops RESPIRATORY:  Clear to auscultation without rales, wheezing or rhonchi  ABDOMEN: Soft, non-tender, non-distended MUSCULOSKELETAL:  No edema; No deformity  SKIN: Warm and dry NEUROLOGIC:  Alert and oriented x 3 PSYCHIATRIC:  Normal affect   ASSESSMENT & PLAN:    1.  OSA - he is  intolerant to CPAP due to not tolerating the mask.     2.  ASCAD  -s/p PCI mild LAD (BMS) in setting of AWMI complicated by Vfib arrest with 40-50% residual disesae in left circ.   -He denies any recent anginal symptoms -He is intolerant to statins.   -Continue Carvedilol  25 mg twice daily and Repatha  as needed refills  3.  Hyperlipidemia  -LDL goal less than 70  -I have personally reviewed and interpreted outside labs performed by patient's PCP which showed LDL 115 and HDL 50 on 09/20/2022  -Continue Repatha  140 mg injection every 2 weeks and fenofibrate  160 mg daily with as needed refills he will continue on Zetia  10mg  daily and fenofibrate  160mg  daily.   -He is statin intolerant.  -Will send back to lipid clinic given LDL that is not at goal  4.  Ischemic DCM /chronic systolic CHF -2D echo 03/27/2023 with EF 25 to 30% with septal apical and distal anterior/inferior wall akinesis, G2 DD -s/p AICD after vfib arrest followed in device clinic and EP  - He appears euvolemic on exam today - Continue GDMT with carvedilol  25 mg twice daily - Increase Entresto  to 49-51 mg twice daily to try to maximize GDMT - Add Jardiance 10 mg daily - If renal function and blood pressure remained stable will add spironolactone 12.5 mg daily - I have personally reviewed and interpreted outside labs performed by patient's PCP which showed serum creatinine 0.81 and potassium 4.4 on 03/10/2023 - Repeat bmet in 1 week - Follow-up with PA or Pharm.D. CHF clinic in 2 weeks for up titration of heart failure medications     Medication Adjustments/Labs  and Tests Ordered: Current medicines are reviewed at length with the patient today.  Concerns regarding medicines are outlined above.  Tests Ordered: No orders of the defined types were placed in this encounter.  Medication Changes: No orders of the defined types were placed in this encounter.   Disposition:  Followup with me in 2 months  Signed, Gaylyn Keas, MD  06/10/2023 8:52 AM    [facility]

## 2023-06-10 NOTE — Patient Instructions (Signed)
 Medication Instructions:  Increase Entresto  49/51mg  twice daily Start Jardiance  10mg  daily *If you need a refill on your cardiac medications before your next appointment, please call your pharmacy*  Lab Work: BMET 1 week If you have labs (blood work) drawn today and your tests are completely normal, you will receive your results only by: MyChart Message (if you have MyChart) OR A paper copy in the mail If you have any lab test that is abnormal or we need to change your treatment, we will call you to review the results.  Testing/Procedures: Ambulatory referral to PharmD (2 weeks medication management)   Follow-Up: At [facility], you and your health needs are our priority.  As part of our continuing mission to provide you with exceptional heart care, our providers are all part of one team.  This team includes your primary Cardiologist (physician) and Advanced Practice Providers or APPs (Physician Assistants and Nurse Practitioners) who all work together to provide you with the care you need, when you need it.  Your next appointment:   2 month(s)  Provider:   Gaylyn Keas, MD

## 2023-06-16 ENCOUNTER — Telehealth: Payer: Self-pay | Admitting: Cardiology

## 2023-06-16 NOTE — Telephone Encounter (Signed)
 Please confirm lab order has been released to [facility].

## 2023-06-17 DIAGNOSIS — I255 Ischemic cardiomyopathy: Secondary | ICD-10-CM | POA: Diagnosis not present

## 2023-06-17 DIAGNOSIS — I5022 Chronic systolic (congestive) heart failure: Secondary | ICD-10-CM | POA: Diagnosis not present

## 2023-06-17 DIAGNOSIS — I2510 Atherosclerotic heart disease of native coronary artery without angina pectoris: Secondary | ICD-10-CM | POA: Diagnosis not present

## 2023-06-17 DIAGNOSIS — E785 Hyperlipidemia, unspecified: Secondary | ICD-10-CM | POA: Diagnosis not present

## 2023-06-17 DIAGNOSIS — G4733 Obstructive sleep apnea (adult) (pediatric): Secondary | ICD-10-CM | POA: Diagnosis not present

## 2023-06-18 ENCOUNTER — Ambulatory Visit: Payer: Self-pay | Admitting: Cardiology

## 2023-06-18 DIAGNOSIS — Z79899 Other long term (current) drug therapy: Secondary | ICD-10-CM

## 2023-06-18 DIAGNOSIS — I5022 Chronic systolic (congestive) heart failure: Secondary | ICD-10-CM

## 2023-06-18 LAB — BASIC METABOLIC PANEL WITH GFR
BUN/Creatinine Ratio: 24 (ref 10–24)
BUN: 24 mg/dL (ref 8–27)
CO2: 18 mmol/L — ABNORMAL LOW (ref 20–29)
Calcium: 9.4 mg/dL (ref 8.6–10.2)
Chloride: 108 mmol/L — ABNORMAL HIGH (ref 96–106)
Creatinine, Ser: 1.02 mg/dL (ref 0.76–1.27)
Glucose: 120 mg/dL — ABNORMAL HIGH (ref 70–99)
Potassium: 4.3 mmol/L (ref 3.5–5.2)
Sodium: 143 mmol/L (ref 134–144)
eGFR: 82 mL/min/{1.73_m2} (ref >59)

## 2023-06-22 ENCOUNTER — Telehealth: Payer: Self-pay | Admitting: Cardiology

## 2023-06-22 NOTE — Telephone Encounter (Signed)
 Pt c/o medication issue:  1. Name of Medication: Valsartan  and Entresto   2. How are you currently taking this medication (dosage and times per day)?   3. Are you having a reaction (difficulty breathing--STAT)?   4. What is your medication issue? He said the pharmacist at the pharmacy told him, he could not take these 2 medicine together

## 2023-06-24 ENCOUNTER — Telehealth: Payer: Self-pay

## 2023-06-24 ENCOUNTER — Ambulatory Visit: Attending: Cardiovascular Disease

## 2023-06-24 DIAGNOSIS — I5022 Chronic systolic (congestive) heart failure: Secondary | ICD-10-CM

## 2023-06-24 MED ORDER — FENOFIBRATE 160 MG PO TABS: 160.0000 mg | ORAL_TABLET | Freq: Every day | ORAL | 3 refills | Status: DC

## 2023-06-24 MED ORDER — CARVEDILOL 25 MG PO TABS: 25.0000 mg | ORAL_TABLET | Freq: Two times a day (BID) | ORAL | 3 refills | Status: DC

## 2023-06-24 NOTE — Telephone Encounter (Signed)
 Correct, patient should not be on valsartan  and Entresto  together.  Last valsartan  Rx from us  was in 2018. He should not still have medication left. Should not be taking losartan  either.

## 2023-06-24 NOTE — Telephone Encounter (Signed)
 Patient seen in office today for nurse visit. He states he needs grant information for Jardiance  and Entresto  faxed to [employer] in [place].  He also requested refill for carvedilol  and fenofibrate . Refills sent.

## 2023-06-24 NOTE — Telephone Encounter (Signed)
 Called and left voice message to call back.  Pavero, Veryl Gottron, RPH47 minutes ago (2:50 PM)     Correct, patient should not be on valsartan  and Entresto  together.  Last valsartan  Rx from us  was in 2018. He should not still have medication left. Should not be taking losartan  either.

## 2023-06-24 NOTE — Patient Instructions (Signed)
 Medication Instructions:  Your physician recommends that you continue on your current medications as directed. Please refer to the Current Medication list given to you today.  *If you need a refill on your cardiac medications before your next appointment, please call your pharmacy*  Follow-Up: At [facility], you and your health needs are our priority.  As part of our continuing mission to provide you with exceptional heart care, our providers are all part of one team.  This team includes your primary Cardiologist (physician) and Advanced Practice Providers or APPs (Physician Assistants and Nurse Practitioners) who all work together to provide you with the care you need, when you need it.  Your next appointment:   August 10, 2023 at 8:00 AM  Provider:   Gaylyn Keas, MD   We recommend signing up for the patient portal called MyChart.  Sign up information is provided on this After Visit Summary.  MyChart is used to connect with patients for Virtual Visits (Telemedicine).  Patients are able to view lab/test results, encounter notes, upcoming appointments, etc.  Non-urgent messages can be sent to your provider as well.   To learn more about what you can do with MyChart, go to [email or web address].

## 2023-06-24 NOTE — Progress Notes (Signed)
   Nurse Visit   Date of Encounter: 06/24/2023 ID: Joe [name], DOB 05-Oct-1955, MRN [id number]  PCP:  Frederik Charleston, MD   [facility] Providers Cardiologist:  Wilbert Bihari, MD Electrophysiologist:  Danelle Birmingham, MD      Visit Details   VS:  BP 130/80 (BP Location: Left Arm, Patient Position: Sitting, Cuff Size: Normal)   Pulse 82   Ht 6' 2 (1.88 m)   Wt 268 lb 3.2 oz (121.7 kg)   SpO2 95%   BMI 34.43 kg/m  , BMI Body mass index is 34.43 kg/m.  Wt Readings from Last 3 Encounters:  06/24/23 268 lb 3.2 oz (121.7 kg)  06/10/23 270 lb (122.5 kg)  05/11/23 272 lb 8 oz (123.6 kg)     Reason for visit: BP Check after increasing Entresto  dose Performed today: Vitals, Provider consulted:Dr. Mealor, and Education Changes (medications, testing, etc.) : None Length of Visit: 50 minutes  Patient is here today for nurse visit to check BP after increasing dose of Entresto  to 49-51 mg BID and starting Jardiance .   Patient states he never stopped taking Losartan  when starting Entresto  2 months ago, he also stopped taking carvedilol  when Entresto  dose was increased at last office visit on 06/10/23. He also states he has not yet started Jardiance , it was his understanding he was not supposed to take Jardiance  while taking Entresto .   Reviewed last office visit note and medication list. Instructed patient to stop taking losartan  and will need to resume carvedilol  and OK to start Jardiance  as prescribed by Dr. Bihari. Continue Entresto  49-51 mg BID as prescribed. New medication list printed and reviewed with patient.  BP at visit today 130/80, HR 82. Patient has scheduled appt with Pharm D for 08/05/23.  Medications Adjustments/Labs and Tests Ordered: No orders of the defined types were placed in this encounter.  No orders of the defined types were placed in this encounter.    Signed, Roxie LITTIE Louder, RN  06/24/2023 12:04 PM

## 2023-06-24 NOTE — Telephone Encounter (Signed)
 Patient seen for nurse visit today, addressed medications during visit.

## 2023-06-26 ENCOUNTER — Telehealth: Payer: Self-pay

## 2023-06-26 DIAGNOSIS — Z79899 Other long term (current) drug therapy: Secondary | ICD-10-CM

## 2023-06-26 DIAGNOSIS — I10 Essential (primary) hypertension: Secondary | ICD-10-CM

## 2023-06-26 NOTE — Telephone Encounter (Signed)
 Spoke to [employer] ([facility]) who verbalizes understanding of normal labs as of 06/17/23, but now needing to repeat BMET with adjustment of heart failure meds. Lab orders placed.

## 2023-06-26 NOTE — Telephone Encounter (Signed)
 Call to patient to request repeat BMET, no answer left message with no identifiers asking recipient to call our office. Also sent [facility] message. Orders for repeat labs placed.

## 2023-06-26 NOTE — Telephone Encounter (Signed)
-----   Message from Gaylyn Keas sent at 06/24/2023  8:25 PM EDT ----- Regarding: RE: Nurse Visit 06/24/23 BP Check Please get a BMET in 1 week ----- Message ----- From: Luwana Salvo, RN Sent: 06/24/2023  12:24 PM EDT To: Jacqueline Matsu, MD; Cherylyn Cos, RN Subject: Nurse Visit 06/24/23 BP Check                   Dr. Micael Adas,  Patient was seen today for nurse visit to check BP after increasing dose of Entresto  to 49-51 mg BID on 06/10/23. Jardiance  was also added at this time.  Note from nurse visit today available for review.  BP 130/80, HR 82 at visit (same as last office visit with you). He states he had continued taking Losartan  when started on Entresto  on 4/28. He also thought he was supposed to stop carvedilol  and not take Jardiance  with Entresto .  He was instructed to not take losartan , resume carevdilol and start Jardiance  along with new dose of Entresto . He has appt with Pharm D on 7/23 and F/U with you on 7/28.  Please let me or Ardelia Beau know if you have any further recommendations.  Thank you, Kristie

## 2023-06-26 NOTE — Telephone Encounter (Signed)
 See encounter from today.

## 2023-06-26 NOTE — Telephone Encounter (Signed)
 Call to patient to discuss previous stable labs as well as Dr. Micael Adas requesting repeat BMET in one week. No answer, left message with no identifiers asking recipient to call [place] at our office #.

## 2023-06-29 ENCOUNTER — Ambulatory Visit: Payer: Medicare HMO

## 2023-06-29 ENCOUNTER — Other Ambulatory Visit: Payer: Self-pay

## 2023-06-29 DIAGNOSIS — I2510 Atherosclerotic heart disease of native coronary artery without angina pectoris: Secondary | ICD-10-CM

## 2023-06-29 DIAGNOSIS — I255 Ischemic cardiomyopathy: Secondary | ICD-10-CM

## 2023-06-29 DIAGNOSIS — E785 Hyperlipidemia, unspecified: Secondary | ICD-10-CM

## 2023-06-29 DIAGNOSIS — Z79899 Other long term (current) drug therapy: Secondary | ICD-10-CM

## 2023-06-29 DIAGNOSIS — I5022 Chronic systolic (congestive) heart failure: Secondary | ICD-10-CM

## 2023-06-29 LAB — CUP PACEART REMOTE DEVICE CHECK
Battery Remaining Longevity: 110 mo
Battery Remaining Percentage: 90 %
Battery Voltage: 3.02 V
Brady Statistic RV Percent Paced: 1 %
Date Time Interrogation Session: 20250615220350
HighPow Impedance: 57 Ohm
Implantable Lead Connection Status: 753985
Implantable Lead Implant Date: 20121227
Implantable Lead Location: 753860
Implantable Lead Model: 7122
Implantable Pulse Generator Implant Date: 20240617
Lead Channel Impedance Value: 350 Ohm
Lead Channel Pacing Threshold Amplitude: 1 V
Lead Channel Pacing Threshold Pulse Width: 0.5 ms
Lead Channel Sensing Intrinsic Amplitude: 11.5 mV
Lead Channel Setting Pacing Amplitude: 2.5 V
Lead Channel Setting Pacing Pulse Width: 0.5 ms
Lead Channel Setting Sensing Sensitivity: 0.5 mV
Pulse Gen Serial Number: 111072390
Zone Setting Status: 755011

## 2023-06-29 NOTE — Telephone Encounter (Signed)
 Pt requesting a c/b he would like to know why he would need blood work again.

## 2023-06-29 NOTE — Telephone Encounter (Signed)
 Spoke with Pt. Advised Pt that Dr Micael Adas had requested a BMET in one week after it looks like there was some confusion on the medications he was taking. Pt stated understand and is going to get lab work on [employer].

## 2023-07-01 DIAGNOSIS — Z79899 Other long term (current) drug therapy: Secondary | ICD-10-CM | POA: Diagnosis not present

## 2023-07-01 DIAGNOSIS — I5022 Chronic systolic (congestive) heart failure: Secondary | ICD-10-CM | POA: Diagnosis not present

## 2023-07-02 ENCOUNTER — Ambulatory Visit: Payer: Self-pay | Admitting: Internal Medicine

## 2023-07-02 ENCOUNTER — Other Ambulatory Visit: Payer: Self-pay

## 2023-07-02 DIAGNOSIS — Z79899 Other long term (current) drug therapy: Secondary | ICD-10-CM

## 2023-07-02 DIAGNOSIS — I5022 Chronic systolic (congestive) heart failure: Secondary | ICD-10-CM

## 2023-07-02 LAB — BASIC METABOLIC PANEL WITH GFR
BUN/Creatinine Ratio: 15 (ref 10–24)
BUN: 15 mg/dL (ref 8–27)
CO2: 20 mmol/L (ref 20–29)
Calcium: 9.5 mg/dL (ref 8.6–10.2)
Chloride: 108 mmol/L — ABNORMAL HIGH (ref 96–106)
Creatinine, Ser: 0.98 mg/dL (ref 0.76–1.27)
Glucose: 117 mg/dL — ABNORMAL HIGH (ref 70–99)
Potassium: 4.5 mmol/L (ref 3.5–5.2)
Sodium: 146 mmol/L — ABNORMAL HIGH (ref 134–144)
eGFR: 86 mL/min/{1.73_m2} (ref >59)

## 2023-07-02 MED ORDER — SPIRONOLACTONE 25 MG PO TABS: 12.5000 mg | ORAL_TABLET | Freq: Every day | ORAL | 3 refills | Status: DC

## 2023-07-02 NOTE — Telephone Encounter (Signed)
 Call to patient to advise that Renal function is stable, okay to add on spironolactone 12.5 mg daily. Spoke to [employer] ([facility]) who agrees to repeat BMET in 1 week. Orders placed.

## 2023-07-02 NOTE — Telephone Encounter (Signed)
-----   Message from Gaylyn Keas sent at 07/02/2023  8:48 AM EDT ----- Renal function is stable okay to add on spironolactone 12.5 mg daily.  Repeat in 1 week ----- Message ----- From: Interface, [facility] Lab Results In Sent: 07/01/2023  11:41 PM EDT To: Jacqueline Matsu, MD

## 2023-07-09 ENCOUNTER — Ambulatory Visit: Attending: Cardiovascular Disease | Admitting: Pharmacist

## 2023-07-09 ENCOUNTER — Encounter: Payer: Self-pay | Admitting: Pharmacist

## 2023-07-09 ENCOUNTER — Telehealth: Payer: Self-pay | Admitting: Pharmacist

## 2023-07-09 VITALS — BP 117/66 | HR 68

## 2023-07-09 DIAGNOSIS — I5022 Chronic systolic (congestive) heart failure: Secondary | ICD-10-CM | POA: Diagnosis not present

## 2023-07-09 NOTE — Assessment & Plan Note (Signed)
 Assessment: BP is controlled in office BP 117/66 mmHg heart rate 68 (goal <130/80) Started taking spironolactone  12.5 mg from today  Tolerates other HF medications well  Denies dizziness, fatigue, SOB, chest pain or palpitation. Able to do all ADLs without any problem. Denies LEE, PND, or orthopnea. Diet can be improved by reducing salt (sodium) intake to support better blood pressure  Plan:  Continue taking spironolactone  12.5 mg daily, Entresto   49-51 mg twice daily,carvedilol  25 mg twice daily and Jardiance  10 mg daily  Patient to keep record of BP readings with heart rate and report to us  at the next visit Patient to bring BP monitor for validation at next OV  Patient to see PharmD in 4 weeks for follow up  Follow up lab(s): BMP on July 8 or 9

## 2023-07-09 NOTE — Patient Instructions (Addendum)
 Changes made by your pharmacist Robbi Blanch, PharmD at today's visit:    Instructions/Changes  (what do you need to do) Your Notes  (what you did and when you did it)  Continue taking Spironolactone  12.5 mg daily, Entresto   49-51 mg twice daily,carvedilol  25 mg twice daily and Jardiance  10 mg daily    Get BMP checked on July 8 or 9    Follow low salt diet     Bring all of your meds, your BP cuff and your record of home blood pressures to your next appointment.    HOW TO TAKE YOUR BLOOD PRESSURE AT HOME  Rest 5 minutes before taking your blood pressure.  Don't smoke or drink caffeinated beverages for at least 30 minutes before. Take your blood pressure before (not after) you eat. Sit comfortably with your back supported and both feet on the floor (don't cross your legs). Elevate your arm to heart level on a table or a desk. Use the proper sized cuff. It should fit smoothly and snugly around your bare upper arm. There should be enough room to slip a fingertip under the cuff. The bottom edge of the cuff should be 1 inch above the crease of the elbow. Ideally, take 3 measurements at one sitting and record the average.  Important lifestyle changes to control high blood pressure  Intervention  Effect on the BP  Lose extra pounds and watch your waistline Weight loss is one of the most effective lifestyle changes for controlling blood pressure. If you're overweight or obese, losing even a small amount of weight can help reduce blood pressure. Blood pressure might go down by about 1 millimeter of mercury (mm Hg) with each kilogram (about 2.2 pounds) of weight lost.  Exercise regularly As a general goal, aim for at least 30 minutes of moderate physical activity every day. Regular physical activity can lower high blood pressure by about 5 to 8 mm Hg.  Eat a healthy diet Eating a diet rich in whole grains, fruits, vegetables, and low-fat dairy products and low in saturated fat and cholesterol.  A healthy diet can lower high blood pressure by up to 11 mm Hg.  Reduce salt (sodium) in your diet Even a small reduction of sodium in the diet can improve heart health and reduce high blood pressure by about 5 to 6 mm Hg.  Limit alcohol One drink equals 12 ounces of beer, 5 ounces of wine, or 1.5 ounces of 80-proof liquor.  Limiting alcohol to less than one drink a day for women or two drinks a day for men can help lower blood pressure by about 4 mm Hg.   If you have any questions or concerns please use My Chart to send questions or call the office at [phone]

## 2023-07-09 NOTE — Progress Notes (Signed)
 Patient ID: Joe [name]                 DOB: 03-Apr-1955                      MRN: [id number]     HPI: Joe [name] is a 68 y.o. male referred by Dr. Shlomo  to pharmacy clinic for HF medication management. PMH is significant for chronic systolic CHF, dyslipidemia, obesity and OSA .  03/27/2023 with EF 25 to 30% .  Patient presented today instead of tomorrow's appointment. I was bale to see him. Reports he had started taking his spironolactone  from today and so far he is tolerating all his new HF medications well. He still add salt to some of his food and eats out once a week. He denies dizziness, fatigue, SOB, chest pain or palpitation. Able to do all ADLs without any problem. Denies LEE, PND, or orthopnea. He stays active around the house but does not have any exercise routine. Enrolled in the grant for brand name heart and lipid medications.   Current CHF meds: Spironolactone  12.5 mg daily, Entresto   49-51 mg twice daily,carvedilol  25 mg twice daily and Jardiance  10 mg daily  Previously tried:  Adherence Assessment  Do you ever forget to take your medication? Pill box  [] Yes [x] No  Do you ever skip doses due to side effects? [] Yes [x] No  Do you have trouble affording your medicines? [] Yes [x] No  Are you ever unable to pick up your medication due to transportation difficulties? [] Yes [x] No  Do you ever stop taking your medications because you don't believe they are helping? [] Yes [x] No  Do you check your weight daily?  [] Yes [x] No   Adherence strategy: pill box   Barriers to obtaining medications: none   BP goal: <130/80     Social History:  Alcohol: beers 1-2 during week and 6 during Saturday and Sunday  Smoking: none  Diet: low salt diet - lives by himself so does not cook whole meal  Eats out once a week with friends - avoids red meat and fried food. At home use air fryer.   Exercise: none, stays active around the house doing yard work and goes to stores and  walk a lot   Home BP readings: doesn't check at home has BP monitor   Wt Readings from Last 3 Encounters:  06/24/23 268 lb 3.2 oz (121.7 kg)  06/10/23 270 lb (122.5 kg)  05/11/23 272 lb 8 oz (123.6 kg)   BP Readings from Last 3 Encounters:  07/09/23 117/66  06/24/23 130/80  06/10/23 131/80   Pulse Readings from Last 3 Encounters:  07/09/23 68  06/24/23 82  06/10/23 82    Renal function: CrCl cannot be calculated (Unknown ideal weight.).  Past Medical History:  Diagnosis Date   Anemia    Chronic systolic CHF (congestive heart failure), NYHA class 1 (HCC)    EF 37% by nuclear stress test   Coronary artery disease    s/p PCI mild LAD (BMS) in setting of AWMI complicated by Vfib arrest with 40-50% residual disesae in left circ   History of kidney stones    Hyperlipidemia    Hypertension    Ischemic cardiomyopathy    A. 01/09/2011 - s/p [employer] Fortify [employer] [place] AICD   Myocardial infarction (HCC)    OSA (obstructive sleep apnea)    severe with AHI 26/hr intolerant to CPAP   Pneumonia  Presence of permanent cardiac pacemaker    VF (ventricular fibrillation) (HCC)    arrest in setting of AMI 6/11    Current Outpatient Medications on File Prior to Visit  Medication Sig Dispense Refill   acetaminophen  (TYLENOL ) 325 MG tablet Take 2 tablets (650 mg total) by mouth every 6 (six) hours as needed for mild pain (or temp > 37.5 C (99.5 F)). 60 tablet 0   aspirin  81 MG chewable tablet Chew 81 mg by mouth daily.     carvedilol  (COREG ) 25 MG tablet Take 1 tablet (25 mg total) by mouth 2 (two) times daily with a meal. 180 tablet 3   empagliflozin  (JARDIANCE ) 10 MG TABS tablet Take 1 tablet (10 mg total) by mouth daily before breakfast. 90 tablet 3   Evolocumab  (REPATHA  SURECLICK) 140 MG/ML SOAJ Inject 140 mg into the skin every 14 (fourteen) days. 2 mL 11   fenofibrate  160 MG tablet Take 1 tablet (160 mg total) by mouth daily. 90 tablet 3   fish oil-omega-3 fatty acids 1000 MG  capsule Take 3 g by mouth daily.     Multiple Vitamins-Minerals (MENS MULTIVITAMIN) TABS Take 1 tablet by mouth daily.     nitroGLYCERIN  (NITROSTAT ) 0.4 MG SL tablet Place 1 tablet (0.4 mg total) under the tongue every 5 (five) minutes as needed for chest pain. Up to 3 doses 25 tablet 2   sacubitril-valsartan  (ENTRESTO ) 49-51 MG Take 1 tablet by mouth 2 (two) times daily. 180 tablet 3   spironolactone  (ALDACTONE ) 25 MG tablet Take 0.5 tablets (12.5 mg total) by mouth daily. 45 tablet 3   empagliflozin  (JARDIANCE ) 10 MG TABS tablet Take 1 tablet (10 mg total) by mouth daily before breakfast. 28 tablet 0   No current facility-administered medications on file prior to visit.    Allergies  Allergen Reactions   Isosorbide Dinitrate Other (See Comments)    headache   Rosuvastatin  Other (See Comments)    Muscle aches on 5 mg qd and 20 mg qd    Atorvastatin Anxiety    Made his irritable and anxious     Assessment/Plan:  1. CHF -  Chronic systolic CHF (congestive heart failure), NYHA class 1 (HCC) Assessment: BP is controlled in office BP 117/66 mmHg heart rate 68 (goal <130/80) Started taking spironolactone  12.5 mg from today  Tolerates other HF medications well  Denies dizziness, fatigue, SOB, chest pain or palpitation. Able to do all ADLs without any problem. Denies LEE, PND, or orthopnea. Diet can be improved by reducing salt (sodium) intake to support better blood pressure  Plan:  Continue taking spironolactone  12.5 mg daily, Entresto   49-51 mg twice daily,carvedilol  25 mg twice daily and Jardiance  10 mg daily  Patient to keep record of BP readings with heart rate and report to us  at the next visit Patient to bring BP monitor for validation at next OV  Patient to see PharmD in 4 weeks for follow up  Follow up lab(s): BMP on July 8 or 9   Chronic systolic CHF (congestive heart failure), NYHA class 1 (HCC) Overview: EF 37% by nuclear stress test  Assessment &  Plan: Assessment: BP is controlled in office BP 117/66 mmHg heart rate 68 (goal <130/80) Started taking spironolactone  12.5 mg from today  Tolerates other HF medications well  Denies dizziness, fatigue, SOB, chest pain or palpitation. Able to do all ADLs without any problem. Denies LEE, PND, or orthopnea. Diet can be improved by reducing salt (sodium) intake to support better  blood pressure  Plan:  Continue taking spironolactone  12.5 mg daily, Entresto   49-51 mg twice daily,carvedilol  25 mg twice daily and Jardiance  10 mg daily  Patient to keep record of BP readings with heart rate and report to us  at the next visit Patient to bring BP monitor for validation at next OV  Patient to see PharmD in 4 weeks for follow up  Follow up lab(s): BMP on July 8 or 9       Thank you   Robbi Blanch, [place].D [facility] BIRCH. [facility] [address], [place], [place] [address] Phone: [phone]; Fax: [phone]

## 2023-07-09 NOTE — Progress Notes (Signed)
 Patient ID: Joe [name]                 DOB: [birth year]                      MRN: [id number]     HPI: Joe [name] is a 68 y.o. male referred by Dr. Shlomo  to pharmacy clinic for HF medication management. PMH is significant for chronic systolic CHF, dyslipidemia, obesity and OSA .  03/27/2023 with EF 25 to 30% .   Patient presented today instead of tomorrow's appointment. I was bale to see him. Reports he had started taking his spironolactone  from today and so far he is tolerating all his new HF medications well. He still add salt to some of his food and eats out once a week. He denies dizziness, fatigue, SOB, chest pain or palpitation. Able to do all ADLs without any problem. Denies LEE, PND, or orthopnea. He stays active around the house but does not have any exercise routine. Enrolled in the grant for brand name heart and lipid medications.    Current CHF meds: Spironolactone  12.5 mg daily, Entresto   49-51 mg twice daily,carvedilol  25 mg twice daily and Jardiance  10 mg daily  Previously tried:  Adherence Assessment   Do you ever forget to take your medication? Pill box  [] Yes [x] No  Do you ever skip doses due to side effects? [] Yes [x] No  Do you have trouble affording your medicines? [] Yes [x] No  Are you ever unable to pick up your medication due to transportation difficulties? [] Yes [x] No  Do you ever stop taking your medications because you don't believe they are helping? [] Yes [x] No  Do you check your weight daily?  [] Yes [x] No    Adherence strategy: pill box    Barriers to obtaining medications: none    BP goal: <130/80  Social History:  Alcohol: beers 1-2 during week and 6 during Saturday and Sunday  Smoking: none  Diet: low salt diet - lives by himself so does not cook whole meal  Eats out once a week with friends - avoids red meat and fried food. At home use air fryer.    Exercise: none, stays active around the house doing yard work and goes to stores and  walk a lot    Home BP readings: doesn't check at home has BP monitor  Wt Readings from Last 3 Encounters:  06/24/23 268 lb 3.2 oz (121.7 kg)  06/10/23 270 lb (122.5 kg)  05/11/23 272 lb 8 oz (123.6 kg)   BP Readings from Last 3 Encounters:  06/24/23 130/80  06/10/23 131/80  05/11/23 126/80   Pulse Readings from Last 3 Encounters:  06/24/23 82  06/10/23 82  05/11/23 72    Renal function: CrCl cannot be calculated (Unknown ideal weight.).  Past Medical History:  Diagnosis Date   Anemia    Chronic systolic CHF (congestive heart failure), NYHA class 1 (HCC)    EF 37% by nuclear stress test   Coronary artery disease    s/p PCI mild LAD (BMS) in setting of AWMI complicated by Vfib arrest with 40-50% residual disesae in left circ   History of kidney stones    Hyperlipidemia    Hypertension    Ischemic cardiomyopathy    A. 01/09/2011 - s/p St. Jude Fortify ST [employer] [place] AICD   Myocardial infarction (HCC)    OSA (obstructive sleep apnea)    severe with AHI 26/hr intolerant to CPAP  Pneumonia    Presence of permanent cardiac pacemaker    VF (ventricular fibrillation) (HCC)    arrest in setting of AMI 6/11    Current Outpatient Medications on File Prior to Visit  Medication Sig Dispense Refill   acetaminophen  (TYLENOL ) 325 MG tablet Take 2 tablets (650 mg total) by mouth every 6 (six) hours as needed for mild pain (or temp > 37.5 C (99.5 F)). 60 tablet 0   aspirin  81 MG chewable tablet Chew 81 mg by mouth daily.     carvedilol  (COREG ) 25 MG tablet Take 1 tablet (25 mg total) by mouth 2 (two) times daily with a meal. 180 tablet 3   empagliflozin  (JARDIANCE ) 10 MG TABS tablet Take 1 tablet (10 mg total) by mouth daily before breakfast. 90 tablet 3   empagliflozin  (JARDIANCE ) 10 MG TABS tablet Take 1 tablet (10 mg total) by mouth daily before breakfast. 28 tablet 0   Evolocumab  (REPATHA  SURECLICK) 140 MG/ML SOAJ Inject 140 mg into the skin every 14 (fourteen) days. 2 mL 11    fenofibrate  160 MG tablet Take 1 tablet (160 mg total) by mouth daily. 90 tablet 3   fish oil-omega-3 fatty acids 1000 MG capsule Take 3 g by mouth daily.     Multiple Vitamins-Minerals (MENS MULTIVITAMIN) TABS Take 1 tablet by mouth daily.     nitroGLYCERIN  (NITROSTAT ) 0.4 MG SL tablet Place 1 tablet (0.4 mg total) under the tongue every 5 (five) minutes as needed for chest pain. Up to 3 doses 25 tablet 2   sacubitril-valsartan  (ENTRESTO ) 49-51 MG Take 1 tablet by mouth 2 (two) times daily. 180 tablet 3   spironolactone  (ALDACTONE ) 25 MG tablet Take 0.5 tablets (12.5 mg total) by mouth daily. 45 tablet 3   No current facility-administered medications on file prior to visit.    Allergies  Allergen Reactions   Isosorbide Dinitrate Other (See Comments)    headache   Rosuvastatin  Other (See Comments)    Muscle aches on 5 mg qd and 20 mg qd    Atorvastatin Anxiety    Made his irritable and anxious     Assessment/Plan:  1. CHF -  Chronic systolic CHF (congestive heart failure), NYHA class 1 (HCC) Assessment: BP is controlled in office BP 117/66 mmHg heart rate 68 (goal <130/80) Started taking spironolactone  12.5 mg from today  Tolerates other HF medications well  Denies dizziness, fatigue, SOB, chest pain or palpitation. Able to do all ADLs without any problem. Denies LEE, PND, or orthopnea. Diet can be improved by reducing salt (sodium) intake to support better blood pressure  Plan:  Continue taking spironolactone  12.5 mg daily, Entresto   49-51 mg twice daily,carvedilol  25 mg twice daily and Jardiance  10 mg daily  Patient to keep record of BP readings with heart rate and report to us  at the next visit Patient to bring BP monitor for validation at next OV  Patient to see PharmD in 4 weeks for follow up  Follow up lab(s): BMP on July 8 or 9   Chronic systolic CHF (congestive heart failure), NYHA class 1 (HCC) Overview: EF 37% by nuclear stress test  Assessment &  Plan: Assessment: BP is controlled in office BP 117/66 mmHg heart rate 68 (goal <130/80) Started taking spironolactone  12.5 mg from today  Tolerates other HF medications well  Denies dizziness, fatigue, SOB, chest pain or palpitation. Able to do all ADLs without any problem. Denies LEE, PND, or orthopnea. Diet can be improved by reducing salt (sodium)  intake to support better blood pressure  Plan:  Continue taking spironolactone  12.5 mg daily, Entresto   49-51 mg twice daily,carvedilol  25 mg twice daily and Jardiance  10 mg daily  Patient to keep record of BP readings with heart rate and report to us  at the next visit Patient to bring BP monitor for validation at next OV  Patient to see PharmD in 4 weeks for follow up  Follow up lab(s): BMP on July 8 or 9        Thank you   Robbi Blanch, [place].D [place] Elspeth BIRCH. [facility] [address], [place], [place] [address] Phone: [phone]; Fax: [phone]

## 2023-07-10 ENCOUNTER — Ambulatory Visit (INDEPENDENT_AMBULATORY_CARE_PROVIDER_SITE_OTHER): Admitting: Pharmacist

## 2023-07-10 ENCOUNTER — Other Ambulatory Visit (HOSPITAL_COMMUNITY): Payer: Self-pay

## 2023-07-10 ENCOUNTER — Telehealth: Payer: Self-pay

## 2023-07-10 VITALS — BP 117/66 | HR 68

## 2023-07-10 DIAGNOSIS — I5022 Chronic systolic (congestive) heart failure: Secondary | ICD-10-CM | POA: Diagnosis not present

## 2023-07-10 NOTE — Telephone Encounter (Signed)
 Pt made aware of PA approval. Will discuss GLP1 agents at next OV on 07/21 and if patient interested we will initiate the therapy.

## 2023-07-10 NOTE — Telephone Encounter (Signed)
 Pharmacy Patient Advocate Encounter  Received notification from [employer] that Prior Authorization for ZEPBOUND has been APPROVED from 01/14/23 to 01/13/24. Ran test claim, Copay is $0. This test claim was processed through [facility]- copay amounts may vary at other pharmacies due to pharmacy/plan contracts, or as the patient moves through the different stages of their insurance plan.

## 2023-07-10 NOTE — Telephone Encounter (Signed)
 PA request has been Submitted. New Encounter has been or will be created for follow up. For additional info see Pharmacy Prior Auth telephone encounter from 07/10/23.

## 2023-07-10 NOTE — Telephone Encounter (Signed)
 Pharmacy Patient Advocate Encounter   Received notification from Physician's Office that prior authorization for ZEPBOUND is required/requested.   Insurance verification completed.   The patient is insured through [employer] .   Per test claim: PA required; PA submitted to above mentioned insurance via CoverMyMeds Key/confirmation #/EOC [id number] Status is pending

## 2023-07-22 DIAGNOSIS — I5022 Chronic systolic (congestive) heart failure: Secondary | ICD-10-CM | POA: Diagnosis not present

## 2023-07-22 DIAGNOSIS — Z79899 Other long term (current) drug therapy: Secondary | ICD-10-CM | POA: Diagnosis not present

## 2023-07-23 LAB — BASIC METABOLIC PANEL WITH GFR
BUN/Creatinine Ratio: 22 (ref 10–24)
BUN: 27 mg/dL (ref 8–27)
CO2: 20 mmol/L (ref 20–29)
Calcium: 9.7 mg/dL (ref 8.6–10.2)
Chloride: 107 mmol/L — ABNORMAL HIGH (ref 96–106)
Creatinine, Ser: 1.23 mg/dL (ref 0.76–1.27)
Glucose: 108 mg/dL — ABNORMAL HIGH (ref 70–99)
Potassium: 5.3 mmol/L — ABNORMAL HIGH (ref 3.5–5.2)
Sodium: 146 mmol/L — ABNORMAL HIGH (ref 134–144)
eGFR: 65 mL/min/1.73 (ref >59)

## 2023-07-23 NOTE — Telephone Encounter (Signed)
 Call to patient to discuss lab results, no answer. No current DPR on file, left message with no identifiers asking recipient to call [place] at our office #. Also sent [facility] message.

## 2023-07-31 NOTE — Telephone Encounter (Signed)
 Attempted to call the patient twice. Although he answered, he was unable to hear us -- possibly due to a phone issue. Additionally, the listed home phone number was actually his son's contact. Left a voicemail requesting that he return the call at [phone].

## 2023-07-31 NOTE — Telephone Encounter (Signed)
 Pt called back. Reports he does not take any OTC  potassium containing supplement. Will be seeing him on Monday July 21 for GLP1 consultation will get updated BMP to see if K trending in right direction.

## 2023-07-31 NOTE — Telephone Encounter (Signed)
-----   Message from Wilbert Bihari sent at 07/23/2023  9:21 AM EDT ----- Serum creatinine slightly bumped and potassium slightly elevated from prior after initiating spironolactone .  Please find out if patient is taking any vitamins that have potassium in them or any  potassium supplements over-the-counter ----- Message ----- From: Interface, [facility] Lab Results In Sent: 07/22/2023  11:35 PM EDT To: Wilbert JONELLE Bihari, MD

## 2023-08-03 ENCOUNTER — Encounter: Payer: Self-pay | Admitting: Pharmacist

## 2023-08-03 ENCOUNTER — Ambulatory Visit: Attending: Internal Medicine | Admitting: Pharmacist

## 2023-08-03 VITALS — BP 134/80 | HR 64

## 2023-08-03 DIAGNOSIS — I5022 Chronic systolic (congestive) heart failure: Secondary | ICD-10-CM | POA: Diagnosis not present

## 2023-08-03 DIAGNOSIS — E785 Hyperlipidemia, unspecified: Secondary | ICD-10-CM | POA: Diagnosis not present

## 2023-08-03 DIAGNOSIS — I10 Essential (primary) hypertension: Secondary | ICD-10-CM | POA: Diagnosis not present

## 2023-08-03 DIAGNOSIS — G4733 Obstructive sleep apnea (adult) (pediatric): Secondary | ICD-10-CM

## 2023-08-03 NOTE — Progress Notes (Signed)
 Patient ID: Joe [name]                 DOB: August 23, 1955                      MRN: [id number]     HPI: Joe [name] is a 68 y.o. male referred by Dr. Shlomo  to pharmacy clinic for HF medication management. PMH is significant for chronic systolic CHF, dyslipidemia, obesity and OSA .  03/27/2023 with EF 25 to 30% . At last visit MRA was added to the other HF medications. K level were slightly elevated pt was advised to lower potassium rich food. At last visit we did Zepbound coverage assessment. It is covered by his insurance.   The patient presented today for follow-up and expressed interest in updating his lipid panel, as his last labs were done in September 2024. At that time, his triglycerides were significantly elevated, and LDL was greater than 100 mg/dL. He also requested an updated HbA1c, last checked in 2023. The patient is not currently ready to start Zepbound and wishes to review more information about the medication before deciding whether to pursue GLP-1 therapy for weight loss. Last year, some of his medications were briefly held after the diagnosis of a brain aneurysm; it is unclear if the last lipid panel was drawn while he was off Repatha . He has been on fenofibrate  for many years and continues to tolerate his heart failure medications well. His home blood pressure readings are consistently in the 112-120/65-68 mmHg range. He denies dizziness, fatigue, shortness of breath, chest pain, or palpitations. He remains fully functional, able to perform all activities of daily living without difficulty, and denies lower extremity edema, paroxysmal nocturnal dyspnea, or orthopnea.   Current CHF meds: Spironolactone  12.5 mg daily, Entresto   49-51 mg twice daily,carvedilol  25 mg twice daily and Jardiance  10 mg daily  Previously tried:  Adherence Assessment  Do you ever forget to take your medication? Pill box  [] Yes [x] No  Do you ever skip doses due to side effects? [] Yes [x] No  Do  you have trouble affording your medicines? [] Yes [x] No  Are you ever unable to pick up your medication due to transportation difficulties? [] Yes [x] No  Do you ever stop taking your medications because you don't believe they are helping? [] Yes [x] No  Do you check your weight daily?  [] Yes [x] No   Adherence strategy: pill box   Barriers to obtaining medications: none   BP goal: <130/80     Social History:  Alcohol: beers 1-2 during week and 6 during Saturday and Sunday  Smoking: none  Diet: low salt diet - lives by himself so does not cook whole meal  Eats out once a week with friends - avoids red meat and fried food. At home use air fryer.   Exercise: none, stays active around the house doing yard work and goes to stores and walk a lot   Home BP readings: doesn't check at home has BP monitor   Wt Readings from Last 3 Encounters:  06/24/23 268 lb 3.2 oz (121.7 kg)  06/10/23 270 lb (122.5 kg)  05/11/23 272 lb 8 oz (123.6 kg)   BP Readings from Last 3 Encounters:  08/03/23 134/80  07/09/23 117/66  07/09/23 117/66   Pulse Readings from Last 3 Encounters:  08/03/23 64  07/09/23 68  07/09/23 68    Renal function: CrCl cannot be calculated (Unknown ideal weight.).  Past Medical History:  Diagnosis  Date   Anemia    Chronic systolic CHF (congestive heart failure), NYHA class 1 (HCC)    EF 37% by nuclear stress test   Coronary artery disease    s/p PCI mild LAD (BMS) in setting of AWMI complicated by Vfib arrest with 40-50% residual disesae in left circ   History of kidney stones    Hyperlipidemia    Hypertension    Ischemic cardiomyopathy    A. 01/09/2011 - s/p St. Jude Fortify ST VR [place] AICD   Myocardial infarction (HCC)    OSA (obstructive sleep apnea)    severe with AHI 26/hr intolerant to CPAP   Pneumonia    Presence of permanent cardiac pacemaker    VF (ventricular fibrillation) (HCC)    arrest in setting of AMI 6/11    Current Outpatient Medications on  File Prior to Visit  Medication Sig Dispense Refill   acetaminophen  (TYLENOL ) 325 MG tablet Take 2 tablets (650 mg total) by mouth every 6 (six) hours as needed for mild pain (or temp > 37.5 C (99.5 F)). 60 tablet 0   aspirin  81 MG chewable tablet Chew 81 mg by mouth daily.     carvedilol  (COREG ) 25 MG tablet Take 1 tablet (25 mg total) by mouth 2 (two) times daily with a meal. 180 tablet 3   empagliflozin  (JARDIANCE ) 10 MG TABS tablet Take 1 tablet (10 mg total) by mouth daily before breakfast. 90 tablet 3   empagliflozin  (JARDIANCE ) 10 MG TABS tablet Take 1 tablet (10 mg total) by mouth daily before breakfast. 28 tablet 0   Evolocumab  (REPATHA  SURECLICK) 140 MG/ML SOAJ Inject 140 mg into the skin every 14 (fourteen) days. 2 mL 11   fenofibrate  160 MG tablet Take 1 tablet (160 mg total) by mouth daily. 90 tablet 3   fish oil-omega-3 fatty acids 1000 MG capsule Take 3 g by mouth daily.     Multiple Vitamins-Minerals (MENS MULTIVITAMIN) TABS Take 1 tablet by mouth daily.     nitroGLYCERIN  (NITROSTAT ) 0.4 MG SL tablet Place 1 tablet (0.4 mg total) under the tongue every 5 (five) minutes as needed for chest pain. Up to 3 doses 25 tablet 2   sacubitril-valsartan  (ENTRESTO ) 49-51 MG Take 1 tablet by mouth 2 (two) times daily. 180 tablet 3   spironolactone  (ALDACTONE ) 25 MG tablet Take 0.5 tablets (12.5 mg total) by mouth daily. 45 tablet 3   No current facility-administered medications on file prior to visit.    Allergies  Allergen Reactions   Isosorbide Dinitrate Other (See Comments)    headache   Rosuvastatin  Other (See Comments)    Muscle aches on 5 mg qd and 20 mg qd    Atorvastatin Anxiety    Made his irritable and anxious     Assessment/Plan:    Dyslipidemia Overview: Current lipid medications: Repatha  140 mg every 14 days and fenofibrate  160 mg daily Previously tried: Crestor , Zocor  and Zetia  - myalgia  LDL goal < 70 mg/dl  Last lab 90/7975 TC 764, TG 404, LDL 115, HDL 50    Assessment & Plan: A/P: Last year, some of his medications were temporarily held following the diagnosis of a brain aneurysm; it is unclear if his last lipid panel was obtained while off Repatha . He has been on fenofibrate  160 mg daily for many years and tolerates it well. His risk factors include dyslipidemia, obesity, and obstructive sleep apnea (OSA).  He previously tried Crestor , Zocor , and Zetia , but these were discontinued due to myalgia.  An updated lipid panel is scheduled for July 22. If triglycerides and LDL remain elevated, we will consider adding Vascepa 2 g twice daily to target triglyceride reduction and bempedoic acid with ezetimibe  to help achieve LDL goals.  Orders: -     Hemoglobin A1c -     Lipid panel  OSA (obstructive sleep apnea) Overview: severe with AHI 26/hr now on CPAP at 11cmH2O  Assessment & Plan: A/P:  Zepbound PA approved. Pt want to read first about the medication and then he will decide he want to pursue weight loss GLP1 therapy or not.    Chronic systolic CHF (congestive heart failure), NYHA class 1 (HCC) Overview: EF 37% by nuclear stress test  Assessment & Plan: Assessment: BP is controlled in office BP 134/80 mmHg heart rate 64 (goal <130/80) Post MRA addition K level was elevated  Tolerates other HF medications well  Denies dizziness, fatigue, SOB, chest pain or palpitation. Able to do all ADLs without any problem. Denies LEE, PND, or orthopnea. Diet can be improved by reducing salt (sodium) intake to support better blood pressure  Plan:  Continue taking spironolactone  12.5 mg daily, Entresto   49-51 mg twice daily,carvedilol  25 mg twice daily and Jardiance  10 mg daily  Patient to keep record of BP readings with heart rate and report to us  at the next visit Patient to bring BP monitor for validation at next OV  Patient to see PharmD in 4 weeks for follow up  Follow up lab(s): BMP July 22 to assess K level and renal function  In future may up  the Entresto  dose to the next step when k level return to normal limit     Thank you   Robbi Blanch, Pharm.D [place] Joe [name]. [facility] [address], [place], [place] [address] Phone: [phone]; Fax: [phone]

## 2023-08-03 NOTE — Assessment & Plan Note (Signed)
 A/P: Last year, some of his medications were temporarily held following the diagnosis of a brain aneurysm; it is unclear if his last lipid panel was obtained while off Repatha . He has been on fenofibrate  160 mg daily for many years and tolerates it well. His risk factors include dyslipidemia, obesity, and obstructive sleep apnea (OSA).  He previously tried Crestor , Zocor , and Zetia , but these were discontinued due to myalgia. An updated lipid panel is scheduled for July 22. If triglycerides and LDL remain elevated, we will consider adding Vascepa 2 g twice daily to target triglyceride reduction and bempedoic acid with ezetimibe  to help achieve LDL goals.

## 2023-08-03 NOTE — Assessment & Plan Note (Signed)
 Assessment: BP is controlled in office BP 134/80 mmHg heart rate 64 (goal <130/80) Post MRA addition K level was elevated  Tolerates other HF medications well  Denies dizziness, fatigue, SOB, chest pain or palpitation. Able to do all ADLs without any problem. Denies LEE, PND, or orthopnea. Diet can be improved by reducing salt (sodium) intake to support better blood pressure  Plan:  Continue taking spironolactone  12.5 mg daily, Entresto   49-51 mg twice daily,carvedilol  25 mg twice daily and Jardiance  10 mg daily  Patient to keep record of BP readings with heart rate and report to us  at the next visit Patient to bring BP monitor for validation at next OV  Patient to see PharmD in 4 weeks for follow up  Follow up lab(s): BMP July 22 to assess K level and renal function  In future may up the Entresto  dose to the next step when k level return to normal limit

## 2023-08-03 NOTE — Assessment & Plan Note (Signed)
 A/P:  Zepbound PA approved. Pt want to read first about the medication and then he will decide he want to pursue weight loss GLP1 therapy or not.

## 2023-08-04 DIAGNOSIS — E785 Hyperlipidemia, unspecified: Secondary | ICD-10-CM | POA: Diagnosis not present

## 2023-08-04 DIAGNOSIS — R7303 Prediabetes: Secondary | ICD-10-CM | POA: Diagnosis not present

## 2023-08-04 DIAGNOSIS — I10 Essential (primary) hypertension: Secondary | ICD-10-CM | POA: Diagnosis not present

## 2023-08-04 LAB — LIPID PANEL

## 2023-08-05 ENCOUNTER — Ambulatory Visit: Admitting: Pharmacist

## 2023-08-05 ENCOUNTER — Ambulatory Visit: Payer: Self-pay | Admitting: Pharmacist

## 2023-08-05 DIAGNOSIS — E785 Hyperlipidemia, unspecified: Secondary | ICD-10-CM

## 2023-08-05 LAB — BASIC METABOLIC PANEL WITH GFR
BUN/Creatinine Ratio: 21 (ref 10–24)
BUN: 25 mg/dL (ref 8–27)
CO2: 20 mmol/L (ref 20–29)
Calcium: 9.7 mg/dL (ref 8.6–10.2)
Chloride: 104 mmol/L (ref 96–106)
Creatinine, Ser: 1.18 mg/dL (ref 0.76–1.27)
Glucose: 119 mg/dL — ABNORMAL HIGH (ref 70–99)
Potassium: 5.1 mmol/L (ref 3.5–5.2)
Sodium: 143 mmol/L (ref 134–144)
eGFR: 68 mL/min/1.73 (ref >59)

## 2023-08-05 LAB — LIPID PANEL
Cholesterol, Total: 245 mg/dL — AB (ref 100–199)
HDL: 43 mg/dL (ref >39)
LDL CALC COMMENT:: 5.7 ratio — AB (ref 0.0–5.0)
LDL Chol Calc (NIH): 122 mg/dL — AB (ref 0–99)
Triglycerides: 454 mg/dL — AB (ref 0–149)
VLDL Cholesterol Cal: 80 mg/dL — AB (ref 5–40)

## 2023-08-05 LAB — HEMOGLOBIN A1C
Est. average glucose Bld gHb Est-mCnc: 123 mg/dL
Hgb A1c MFr Bld: 5.9 % — ABNORMAL HIGH (ref 4.8–5.6)

## 2023-08-05 NOTE — Telephone Encounter (Signed)
 Lipid panel was reviewed with the patient, showing elevated triglycerides (TG) and LDL levels. The patient confirms regular adherence to fenofibrate . Detailed lifestyle interventions were discussed, emphasizing reducing processed food intake, which the patient is willing to implement. Due to statin intolerance, the next step to further lower LDL is to replace ezetimibe  (Zetia ) with Nexlizet 180/10 mg; baseline uric acid will be checked today prior to starting. The patient will continue Repatha  (evolocumab ) as well. To address elevated triglycerides, Vascepa 2 gm twice daily will be added to the current fenofibrate  160 mg daily regimen.

## 2023-08-05 NOTE — Telephone Encounter (Signed)
 Called to discuss lab results, no answer. Left message per DPR. Third attempt. Letter sent.

## 2023-08-05 NOTE — Telephone Encounter (Signed)
-----   Message from Wilbert Bihari sent at 07/23/2023  9:21 AM EDT ----- Serum creatinine slightly bumped and potassium slightly elevated from prior after initiating spironolactone .  Please find out if patient is taking any vitamins that have potassium in them or any  potassium supplements over-the-counter ----- Message ----- From: Interface, [facility] Lab Results In Sent: 07/22/2023  11:35 PM EDT To: Wilbert JONELLE Bihari, MD

## 2023-08-06 NOTE — Progress Notes (Signed)
 Remote ICD transmission.

## 2023-08-10 ENCOUNTER — Encounter: Payer: Self-pay | Admitting: Cardiology

## 2023-08-10 ENCOUNTER — Ambulatory Visit: Attending: Cardiology | Admitting: Cardiology

## 2023-08-10 VITALS — BP 116/76 | HR 64 | Ht 74.0 in | Wt 268.0 lb

## 2023-08-10 DIAGNOSIS — I5022 Chronic systolic (congestive) heart failure: Secondary | ICD-10-CM

## 2023-08-10 DIAGNOSIS — I2510 Atherosclerotic heart disease of native coronary artery without angina pectoris: Secondary | ICD-10-CM

## 2023-08-10 DIAGNOSIS — G4733 Obstructive sleep apnea (adult) (pediatric): Secondary | ICD-10-CM

## 2023-08-10 DIAGNOSIS — E785 Hyperlipidemia, unspecified: Secondary | ICD-10-CM | POA: Diagnosis not present

## 2023-08-10 DIAGNOSIS — I255 Ischemic cardiomyopathy: Secondary | ICD-10-CM

## 2023-08-10 NOTE — Patient Instructions (Signed)
 Medication Instructions:  Your physician recommends that you continue on your current medications as directed. Please refer to the Current Medication list given to you today.  *If you need a refill on your cardiac medications before your next appointment, please call your pharmacy*  Lab Work: None.  If you have labs (blood work) drawn today and your tests are completely normal, you will receive your results only by: MyChart Message (if you have MyChart) OR A paper copy in the mail If you have any lab test that is abnormal or we need to change your treatment, we will call you to review the results.  Testing/Procedures: None.  Follow-Up: At [facility], you and your health needs are our priority.  As part of our continuing mission to provide you with exceptional heart care, our providers are all part of one team.  This team includes your primary Cardiologist (physician) and Advanced Practice Providers or APPs (Physician Assistants and Nurse Practitioners) who all work together to provide you with the care you need, when you need it.  Your next appointment:   1 year(s)  Provider:   Wilbert Bihari, MD    We recommend signing up for the patient portal called MyChart.  Sign up information is provided on this After Visit Summary.  MyChart is used to connect with patients for Virtual Visits (Telemedicine).  Patients are able to view lab/test results, encounter notes, upcoming appointments, etc.  Non-urgent messages can be sent to your provider as well.   To learn more about what you can do with MyChart, go to [email or web address].

## 2023-08-10 NOTE — Progress Notes (Signed)
 Date:  08/10/2023   ID:  Joe [name], DOB [birth year], MRN [id number]  PCP:  Frederik Charleston, MD  Cardiologist:  Wilbert Bihari, MD  Electrophysiologist:  Danelle Birmingham, MD   Chief Complaint:  OSA, CAD, CHF, lipids, DCM  History of Present Illness:    Joe [name] is a 68 y.o. male with a history of ASCAD, ischemic DCM with AICD, chronic systolic CHF c, dyslipidemia, obesity and OSA.  He is followed by Dr. Birmingham for his ICD.    He also has a history of OSA but has not been using his PAP device because he did not tolerate the mask.    The patient presents today for followup.  He denies any Chest pain or pressure, SOB, DOE, LE edema, dizziness, palpitations or syncope.  Prior CV studies:   The following studies were reviewed today:  2D echo 2025  Past Medical History:  Diagnosis Date   Anemia    Chronic systolic CHF (congestive heart failure), NYHA class 1 (HCC)    EF 37% by nuclear stress test   Coronary artery disease    s/p PCI mild LAD (BMS) in setting of AWMI complicated by Vfib arrest with 40-50% residual disesae in left circ   History of kidney stones    Hyperlipidemia    Hypertension    Ischemic cardiomyopathy    A. 01/09/2011 - s/p St. Jude Fortify ST VR [place] AICD   Myocardial infarction (HCC)    OSA (obstructive sleep apnea)    severe with AHI 26/hr intolerant to CPAP   Pneumonia    Presence of permanent cardiac pacemaker    VF (ventricular fibrillation) (HCC)    arrest in setting of AMI 6/11   Past Surgical History:  Procedure Laterality Date   BACK SURGERY     BACK SURGERY     ICD GENERATOR CHANGEOUT N/A 06/30/2022   Procedure: ICD GENERATOR CHANGEOUT;  Surgeon: Birmingham Danelle ORN, MD;  Location: [facility] INVASIVE CV LAB;  Service: Cardiovascular;  Laterality: N/A;   IMPLANTABLE CARDIOVERTER DEFIBRILLATOR IMPLANT N/A 01/09/2011   Procedure: IMPLANTABLE CARDIOVERTER DEFIBRILLATOR IMPLANT;  Surgeon: Danelle ORN Birmingham, MD;  Location: [facility] CATH LAB;  Service:  Cardiovascular;  Laterality: N/A;   IR ANGIO INTRA EXTRACRAN SEL INTERNAL CAROTID BILAT MOD SED  01/26/2022   IR ANGIO VERTEBRAL SEL VERTEBRAL UNI R MOD SED  01/26/2022   IR ANGIO VERTEBRAL SEL VERTEBRAL UNI R MOD SED  07/22/2022   IR ANGIOGRAM FOLLOW UP STUDY  01/26/2022   IR NEURO EACH ADD'L AFTER BASIC UNI LEFT (MS)  01/26/2022   IR TRANSCATH/EMBOLIZ  01/26/2022   IR US  GUIDE VASC ACCESS RIGHT  07/22/2022   KNEE ARTHROSCOPY     right   LAPAROSCOPIC REVISION VENTRICULAR-PERITONEAL (V-P) SHUNT N/A 04/22/2022   Procedure: LAPAROSCOPIC VENTRICULAR-PERITONEAL (V-P) SHUNT;  Surgeon: Lyndel Deward PARAS, MD;  Location: [facility] OR;  Service: General;  Laterality: N/A;   PACEMAKER INSERTION  2012   St Jude   RADIOLOGY WITH ANESTHESIA N/A 01/26/2022   Procedure: IR WITH ANESTHESIA;  Surgeon: Lanis Pupa, MD;  Location: [facility] OR;  Service: Radiology;  Laterality: N/A;   VENTRICULOPERITONEAL SHUNT N/A 04/22/2022   Procedure: Laparoscopic assisted VP shunt placement;  Surgeon: Lanis Pupa, MD;  Location: [facility] OR;  Service: Neurosurgery;  Laterality: N/A;  RM 18     Current Meds  Medication Sig   acetaminophen  (TYLENOL ) 325 MG tablet Take 2 tablets (650 mg total) by mouth every 6 (six) hours as needed for mild pain (  or temp > 37.5 C (99.5 F)).   aspirin  81 MG chewable tablet Chew 81 mg by mouth daily.   carvedilol  (COREG ) 25 MG tablet Take 1 tablet (25 mg total) by mouth 2 (two) times daily with a meal.   empagliflozin  (JARDIANCE ) 10 MG TABS tablet Take 1 tablet (10 mg total) by mouth daily before breakfast.   Evolocumab  (REPATHA  SURECLICK) 140 MG/ML SOAJ Inject 140 mg into the skin every 14 (fourteen) days.   fenofibrate  160 MG tablet Take 1 tablet (160 mg total) by mouth daily.   fish oil-omega-3 fatty acids 1000 MG capsule Take 3 g by mouth daily.   Multiple Vitamins-Minerals (MENS MULTIVITAMIN) TABS Take 1 tablet by mouth daily.   nitroGLYCERIN  (NITROSTAT ) 0.4 MG SL tablet Place 1 tablet (0.4 mg total)  under the tongue every 5 (five) minutes as needed for chest pain. Up to 3 doses   sacubitril-valsartan  (ENTRESTO ) 49-51 MG Take 1 tablet by mouth 2 (two) times daily.   spironolactone  (ALDACTONE ) 25 MG tablet Take 0.5 tablets (12.5 mg total) by mouth daily.     Allergies:   Isosorbide dinitrate, Rosuvastatin , and Atorvastatin   Social History   Tobacco Use   Smoking status: Every Day    Current packs/day: 0.00    Average packs/day: 0.3 packs/day for 34.0 years (8.5 ttl pk-yrs)    Types: Cigarettes    Start date: 06/14/1975    Last attempt to quit: 06/13/2009    Years since quitting: 14.1   Smokeless tobacco: Never   Tobacco comments:    Socially  Vaping Use   Vaping status: Never Used  Substance Use Topics   Alcohol use: Yes    Comment: occasional   Drug use: No     Family Hx: The patient's family history includes Aneurysm in his mother.  ROS:   Please see the history of present illness.    All other systems reviewed and are negative.   Labs/Other Tests and Data Reviewed:    Recent Labs: 03/10/2023: Hemoglobin 15.7; Magnesium 2.0; Platelets 256 08/04/2023: BUN 25; Creatinine, Ser 1.18; Potassium 5.1; Sodium 143   Recent Lipid Panel Lab Results  Component Value Date/Time   CHOL 245 (H) 08/04/2023 08:03 AM   CHOL 243 (H) 03/15/2013 08:31 AM   TRIG 454 (H) 08/04/2023 08:03 AM   TRIG 245 (H) 03/15/2013 08:31 AM   HDL 43 08/04/2023 08:03 AM   HDL 53 03/15/2013 08:31 AM   CHOLHDL 5.7 (H) 08/04/2023 08:03 AM   CHOLHDL 2.7 07/11/2015 07:48 AM   LDLCALC 122 (H) 08/04/2023 08:03 AM   LDLCALC 141 (H) 03/15/2013 08:31 AM   LDLDIRECT 98.0 06/28/2014 07:51 AM    Wt Readings from Last 3 Encounters:  08/10/23 268 lb (121.6 kg)  06/24/23 268 lb 3.2 oz (121.7 kg)  06/10/23 270 lb (122.5 kg)     Objective:    Vital Signs:  BP 116/76   Pulse 64   Ht 6' 2 (1.88 m)   Wt 268 lb (121.6 kg)   SpO2 95%   BMI 34.41 kg/m   GEN: Well nourished, well developed in no acute  distress HEENT: Normal NECK: No JVD; No carotid bruits LYMPHATICS: No lymphadenopathy CARDIAC:RRR, no murmurs, rubs, gallops RESPIRATORY:  Clear to auscultation without rales, wheezing or rhonchi  ABDOMEN: Soft, non-tender, non-distended MUSCULOSKELETAL:  No edema; No deformity  SKIN: Warm and dry NEUROLOGIC:  Alert and oriented x 3 PSYCHIATRIC:  Normal affect  ASSESSMENT & PLAN:    OSA - he  is intolerant to CPAP due to not tolerating the mask.     ASCAD  -s/p PCI mild LAD (BMS) in setting of AWMI complicated by Vfib arrest with 40-50% residual disesae in left circ.   -He denies any recent anginal symptoms -He is intolerant to statins.   -Continue Carvedilol  25 mg twice daily and Repatha  as needed refills  Hyperlipidemia  -LDL goal less than 70  - Continue fish oil 3 g daily, fenofibrate  160 mg daily and Repatha  with as needed refills -I have personally reviewed and interpreted outside labs performed by patient's PCP which showed LDL 122, HDL 43>>he missed Repatha  the first of the month so that it why the LDL is elevated. TAGs was 454 and was fasting -He was recently seen by pharmD and recommended starting Nexlizet  180/10mg  daily and change fish oil to Vascepa  2gm BID after checking uric acid level which he has not yet had done.  Ischemic DCM /chronic systolic CHF/VT -2D echo 03/27/2023 with EF 25 to 30% with septal apical and distal anterior/inferior wall akinesis, G2 DD -s/p AICD after vfib arrest followed in device clinic and EP  - He appears euvolemic on exam today with no lower extremity edema or shortness of breath. - Continue GDMT with spironolactone  12.5 mg daily, Entresto  49-51 mg twice daily, carvedilol  25 mg twice daily and Jardiance  10 mg daily with as needed refills - I have personally reviewed and interpreted outside labs performed by patient's PCP which showed SCr 1.18 and K+5.1  Medication Adjustments/Labs and Tests Ordered: Current medicines are reviewed at length  with the patient today.  Concerns regarding medicines are outlined above.  Tests Ordered: No orders of the defined types were placed in this encounter.  Medication Changes: No orders of the defined types were placed in this encounter.   Disposition:  Followup with me in 1 year  Signed, Wilbert Bihari, MD  08/10/2023 8:22 AM    [facility]

## 2023-08-11 DIAGNOSIS — E785 Hyperlipidemia, unspecified: Secondary | ICD-10-CM | POA: Diagnosis not present

## 2023-08-12 ENCOUNTER — Telehealth: Payer: Self-pay | Admitting: Pharmacist

## 2023-08-12 ENCOUNTER — Telehealth: Payer: Self-pay | Admitting: Pharmacy Technician

## 2023-08-12 ENCOUNTER — Other Ambulatory Visit (HOSPITAL_COMMUNITY): Payer: Self-pay

## 2023-08-12 DIAGNOSIS — E785 Hyperlipidemia, unspecified: Secondary | ICD-10-CM

## 2023-08-12 LAB — URIC ACID: Uric Acid: 6.5 mg/dL (ref 3.8–8.4)

## 2023-08-12 MED ORDER — ICOSAPENT ETHYL 1 G PO CAPS: 2.0000 g | ORAL_CAPSULE | Freq: Two times a day (BID) | ORAL | 11 refills | Status: DC

## 2023-08-12 MED ORDER — ICOSAPENT ETHYL 1 G PO CAPS
2.0000 g | ORAL_CAPSULE | Freq: Two times a day (BID) | ORAL | 11 refills | Status: DC
  Filled 2023-08-12: qty 120, 30d supply, fill #0

## 2023-08-12 MED ORDER — NEXLIZET 180-10 MG PO TABS
1.0000 | ORAL_TABLET | Freq: Every day | ORAL | 11 refills | Status: DC
  Filled 2023-08-12: qty 30, 30d supply, fill #0

## 2023-08-12 MED ORDER — NEXLIZET 180-10 MG PO TABS: 1.0000 | ORAL_TABLET | Freq: Every day | ORAL | 11 refills | Status: DC

## 2023-08-12 NOTE — Telephone Encounter (Signed)
 Pharmacy Patient Advocate Encounter  Received notification from [employer] that Prior Authorization for nexlizet  has been APPROVED from 08/12/23 to 01/13/24. Ran test claim, Copay is $0.00. This test claim was processed through [facility]- copay amounts may vary at other pharmacies due to pharmacy/plan contracts, or as the patient moves through the different stages of their insurance plan.   PA #/Case ID/Reference #: [id number]

## 2023-08-12 NOTE — Addendum Note (Signed)
 Addended by: Kerim Statzer K on: 08/12/2023 05:07 PM   Modules accepted: Orders

## 2023-08-12 NOTE — Telephone Encounter (Signed)
   Ins wont pay for generic but will pay for grand vascepa  with free copay

## 2023-08-12 NOTE — Telephone Encounter (Signed)
 Patient informed about therapy change. Uric acid lab due in 1 month and pt to watch for gout symptoms and report us  if he develops any.

## 2023-08-12 NOTE — Telephone Encounter (Signed)
 Pt calls  Pharmacy Patient Advocate Encounter   Received notification from Pt Calls Messages that prior authorization for NEXLIZET  is required/requested.   Insurance verification completed.   The patient is insured through [employer] .   Per test claim: PA required; PA submitted to above mentioned insurance via LATENT Key/confirmation #/EOC [id number] Status is pending

## 2023-08-18 ENCOUNTER — Other Ambulatory Visit (HOSPITAL_COMMUNITY): Payer: Self-pay

## 2023-08-18 MED ORDER — ICOSAPENT ETHYL 1 G PO CAPS
2.0000 g | ORAL_CAPSULE | Freq: Two times a day (BID) | ORAL | 11 refills | Status: DC
  Filled 2023-08-18: qty 120, 30d supply, fill #0
  Filled 2023-09-18: qty 120, 30d supply, fill #1
  Filled 2023-10-19: qty 120, 30d supply, fill #2

## 2023-08-18 NOTE — Addendum Note (Signed)
 Addended by: Addilynne Olheiser K on: 08/18/2023 03:25 PM   Modules accepted: Orders

## 2023-08-18 NOTE — Telephone Encounter (Signed)
 Pt reported [facility] states this med will coat him $130 per month and they do not have it in [place].  At our pharmacy co-pay is $0 for brand name Vascepa . Pt is in agreement to get it filled from us . Prescription sent and pt informed

## 2023-09-10 NOTE — Telephone Encounter (Signed)
 Call to remind patient about follow up lipid lab. Will be going on Sept 2,2025

## 2023-09-11 NOTE — Telephone Encounter (Signed)
PA approved see other encounter

## 2023-09-16 DIAGNOSIS — E785 Hyperlipidemia, unspecified: Secondary | ICD-10-CM | POA: Diagnosis not present

## 2023-09-17 LAB — URIC ACID: Uric Acid: 8 mg/dL (ref 3.8–8.4)

## 2023-09-18 ENCOUNTER — Ambulatory Visit: Payer: Self-pay | Admitting: Pharmacist

## 2023-09-18 ENCOUNTER — Other Ambulatory Visit (HOSPITAL_COMMUNITY): Payer: Self-pay

## 2023-09-18 MED ORDER — SPIRONOLACTONE 25 MG PO TABS
12.5000 mg | ORAL_TABLET | Freq: Every day | ORAL | 3 refills | Status: AC
  Filled 2023-09-18: qty 45, 90d supply, fill #0
  Filled 2023-12-14: qty 45, 90d supply, fill #1

## 2023-09-18 MED ORDER — NEXLIZET 180-10 MG PO TABS
1.0000 | ORAL_TABLET | Freq: Every day | ORAL | 11 refills | Status: DC
  Filled 2023-09-18: qty 30, 30d supply, fill #0

## 2023-09-18 MED ORDER — REPATHA SURECLICK 140 MG/ML ~~LOC~~ SOAJ
140.0000 mg | SUBCUTANEOUS | 3 refills | Status: AC
  Filled 2023-09-18: qty 6, 84d supply, fill #0
  Filled 2023-12-15: qty 6, 84d supply, fill #1

## 2023-09-18 MED ORDER — EMPAGLIFLOZIN 10 MG PO TABS
10.0000 mg | ORAL_TABLET | Freq: Every day | ORAL | 3 refills | Status: AC
  Filled 2023-09-18: qty 90, 90d supply, fill #0
  Filled 2023-12-14: qty 90, 90d supply, fill #1

## 2023-09-18 MED ORDER — SACUBITRIL-VALSARTAN 49-51 MG PO TABS
1.0000 | ORAL_TABLET | Freq: Two times a day (BID) | ORAL | 3 refills | Status: AC
  Filled 2023-09-18: qty 180, 90d supply, fill #0
  Filled 2023-12-14: qty 180, 90d supply, fill #1

## 2023-09-18 NOTE — Telephone Encounter (Signed)
 Discussed the lab result over the phone we will continue with current lipid lowering therapy including Nexlizet . Uric acid level is elevated -ve for gout symptoms. Will repeat lab in 4-6  months for close monitoring. Patient has been educated for gout symptoms will inform if notice any of them. Pt would like to get refill on his Nexlizet  , Jardiance , spironolactone , Vascepa , Entresto , Nexlizet  and Repatha  from [facility] outpatient pharmacy. Will send prescriptions to preferred pharmacy.

## 2023-09-28 ENCOUNTER — Ambulatory Visit (INDEPENDENT_AMBULATORY_CARE_PROVIDER_SITE_OTHER): Payer: Medicare HMO

## 2023-09-28 DIAGNOSIS — I255 Ischemic cardiomyopathy: Secondary | ICD-10-CM | POA: Diagnosis not present

## 2023-09-29 LAB — CUP PACEART REMOTE DEVICE CHECK
Battery Remaining Longevity: 108 mo
Battery Remaining Percentage: 88 %
Battery Voltage: 3.02 V
Brady Statistic RV Percent Paced: 1 %
Date Time Interrogation Session: 20250915021901
HighPow Impedance: 60 Ohm
Implantable Lead Connection Status: 753985
Implantable Lead Implant Date: 20121227
Implantable Lead Location: 753860
Implantable Lead Model: 7122
Implantable Pulse Generator Implant Date: 20240617
Lead Channel Impedance Value: 350 Ohm
Lead Channel Pacing Threshold Amplitude: 1 V
Lead Channel Pacing Threshold Pulse Width: 0.5 ms
Lead Channel Sensing Intrinsic Amplitude: 11.5 mV
Lead Channel Setting Pacing Amplitude: 2.5 V
Lead Channel Setting Pacing Pulse Width: 0.5 ms
Lead Channel Setting Sensing Sensitivity: 0.5 mV
Pulse Gen Serial Number: 111072390
Zone Setting Status: 755011

## 2023-10-02 ENCOUNTER — Ambulatory Visit: Payer: Self-pay | Admitting: Internal Medicine

## 2023-10-05 NOTE — Progress Notes (Signed)
Remote ICD Transmission.

## 2023-10-07 ENCOUNTER — Telehealth: Payer: Self-pay | Admitting: Pharmacist

## 2023-10-07 DIAGNOSIS — E785 Hyperlipidemia, unspecified: Secondary | ICD-10-CM

## 2023-10-07 NOTE — Telephone Encounter (Signed)
 Follow up lipid lab due. Call to remind patient.

## 2023-10-09 DIAGNOSIS — E785 Hyperlipidemia, unspecified: Secondary | ICD-10-CM | POA: Diagnosis not present

## 2023-10-09 LAB — LIPID PANEL
Chol/HDL Ratio: 4.2 ratio (ref 0.0–5.0)
Cholesterol, Total: 129 mg/dL (ref 100–199)
HDL: 31 mg/dL — ABNORMAL LOW (ref >39)
LDL Chol Calc (NIH): 28 mg/dL (ref 0–99)
Triglycerides: 507 mg/dL — ABNORMAL HIGH (ref 0–149)
VLDL Cholesterol Cal: 70 mg/dL — ABNORMAL HIGH (ref 5–40)

## 2023-10-12 ENCOUNTER — Ambulatory Visit: Payer: Self-pay | Admitting: Pharmacist

## 2023-10-12 NOTE — Telephone Encounter (Signed)
 Spoke to patient, reports this was fasting lab. He takes Vascepa  and fenofibrate  regularly along with Repatha  and Nexlizet . Uses pill box to track med adherence. He eats fairly healthy and EtOh consumption is in moderation (6 packs beers last for 1 week) exercise regularly and lifestyle is fairly active. Reports no family member that had elevated TG level hx.   May get NMR to assess insulin resistance and discuss lab further at the next OV on 10/19/2023.

## 2023-10-15 DIAGNOSIS — E785 Hyperlipidemia, unspecified: Secondary | ICD-10-CM | POA: Diagnosis not present

## 2023-10-19 ENCOUNTER — Ambulatory Visit: Attending: Cardiology | Admitting: Pharmacist

## 2023-10-19 ENCOUNTER — Other Ambulatory Visit (HOSPITAL_COMMUNITY): Payer: Self-pay

## 2023-10-19 ENCOUNTER — Encounter: Payer: Self-pay | Admitting: Pharmacist

## 2023-10-19 DIAGNOSIS — E785 Hyperlipidemia, unspecified: Secondary | ICD-10-CM | POA: Diagnosis not present

## 2023-10-19 MED ORDER — ICOSAPENT ETHYL 1 G PO CAPS
2.0000 g | ORAL_CAPSULE | Freq: Two times a day (BID) | ORAL | 11 refills | Status: DC
  Filled 2023-10-19: qty 120, 30d supply, fill #0

## 2023-10-19 NOTE — Progress Notes (Signed)
 Patient ID: Joe [name]                 DOB: 03-Mar-1955                      MRN: [id number]     HPI: Joe [name] is a 68 y.o. male referred by Dr. Shlomo  to pharmacy clinic for HF medication management. PMH is significant for chronic systolic CHF, dyslipidemia, obesity and OSA .  03/27/2023 with EF 25 to 30% . At last visit MRA was added to the other HF medications. K level were slightly elevated pt was advised to lower potassium rich food. At last visit we did Zepbound coverage assessment. It is covered by his insurance.   The patient presented today for a lipid clinic follow-up and medication simplification. We reviewed the most recent lipid panel in detail, with a particular focus on elevated triglyceride (TG) levels. Lifestyle interventions to reduce TG were discussed extensively. Although the patient reports following a healthy diet and consuming alcohol in moderation, I believe there is room for dietary improvement, as TG levels continue to rise. The patient expressed some confusion regarding the indications and timing of his medications. A personalized medication chart outlining each drug's indication and optimal administration time was provided to assist with organizing his pillbox. Additionally, the patient expressed interest in participating in clinical trials targeting elevated TG levels. One potential trial is currently available through the medication management program. With the patient's permission, I will share his information with the study team to assess eligibility.  Current lipid meds: Repatha  140 mg every 14 days, Vascepa  2 gm twice daily, fenofibrate  160 mg daily, Nexlizet  180/10 mg daily  Last lipid lab: TC 129, TG 507, HDL 31, LDLc 28     Social History:  Alcohol: beers 1-2 beers per day  Smoking: none  Diet: low salt diet - lives by himself so does not cook whole meal  Eats out once a week with friends - avoids red meat and fried food. At home use air  fryer.   Exercise: none, stays active around the house doing yard work and goes to stores and walk a lot   Home BP readings: doesn't check at home has BP monitor   Wt Readings from Last 3 Encounters:  08/10/23 268 lb (121.6 kg)  06/24/23 268 lb 3.2 oz (121.7 kg)  06/10/23 270 lb (122.5 kg)   BP Readings from Last 3 Encounters:  08/10/23 116/76  08/03/23 134/80  07/09/23 117/66   Pulse Readings from Last 3 Encounters:  08/10/23 64  08/03/23 64  07/09/23 68    Renal function: CrCl cannot be calculated (Patient's most recent lab result is older than the maximum 21 days allowed.).  Past Medical History:  Diagnosis Date   Anemia    Chronic systolic CHF (congestive heart failure), NYHA class 1 (HCC)    EF 37% by nuclear stress test   Coronary artery disease    s/p PCI mild LAD (BMS) in setting of AWMI complicated by Vfib arrest with 40-50% residual disesae in left circ   History of kidney stones    Hyperlipidemia    Hypertension    Ischemic cardiomyopathy    A. 01/09/2011 - s/p St. Jude Fortify [employer] [place] AICD   Myocardial infarction (HCC)    OSA (obstructive sleep apnea)    severe with AHI 26/hr intolerant to CPAP   Pneumonia    Presence of permanent cardiac pacemaker  VF (ventricular fibrillation) (HCC)    arrest in setting of AMI 6/11    Current Outpatient Medications on File Prior to Visit  Medication Sig Dispense Refill   acetaminophen  (TYLENOL ) 325 MG tablet Take 2 tablets (650 mg total) by mouth every 6 (six) hours as needed for mild pain (or temp > 37.5 C (99.5 F)). 60 tablet 0   aspirin  81 MG chewable tablet Chew 81 mg by mouth daily.     Bempedoic Acid -Ezetimibe  (NEXLIZET ) 180-10 MG TABS Take 1 tablet by mouth daily. 30 tablet 11   carvedilol  (COREG ) 25 MG tablet Take 1 tablet (25 mg total) by mouth 2 (two) times daily with a meal. 180 tablet 3   empagliflozin  (JARDIANCE ) 10 MG TABS tablet Take 1 tablet (10 mg total) by mouth daily before breakfast. (Patient  not taking: Reported on 08/10/2023) 28 tablet 0   empagliflozin  (JARDIANCE ) 10 MG TABS tablet Take 1 tablet (10 mg total) by mouth daily before breakfast. 90 tablet 3   Evolocumab  (REPATHA  SURECLICK) 140 MG/ML SOAJ Inject 140 mg into the skin every 14 (fourteen) days. 6 mL 3   fenofibrate  160 MG tablet Take 1 tablet (160 mg total) by mouth daily. 90 tablet 3   Multiple Vitamins-Minerals (MENS MULTIVITAMIN) TABS Take 1 tablet by mouth daily.     nitroGLYCERIN  (NITROSTAT ) 0.4 MG SL tablet Place 1 tablet (0.4 mg total) under the tongue every 5 (five) minutes as needed for chest pain. Up to 3 doses 25 tablet 2   sacubitril -valsartan  (ENTRESTO ) 49-51 MG Take 1 tablet by mouth 2 (two) times daily. 180 tablet 3   spironolactone  (ALDACTONE ) 25 MG tablet Take 0.5 tablets (12.5 mg total) by mouth daily. 90 tablet 3   No current facility-administered medications on file prior to visit.    Allergies  Allergen Reactions   Isosorbide Dinitrate Other (See Comments)    headache   Rosuvastatin  Other (See Comments)    Muscle aches on 5 mg qd and 20 mg qd    Atorvastatin Anxiety    Made his irritable and anxious    Hyperlipidemia:  Assessment/Plan:  TG level keep going up despite patient taking fenofibrate  and Vascepa . Intolerance to various statins that would help lowering TG. Patient doesn't want to try any status. LDLc at goal on Repatha  ans Nexlizet   Lifestyle interventions to reduce TG were discussed extensively. Although the patient reports following a healthy diet and consuming alcohol in moderation, I believe there is room for dietary improvement, as TG levels continue to rise. Additionally, the patient expressed interest in participating in clinical trials targeting elevated TG levels. One potential trial is currently available through the medication management program. With the patient's permission, I will share his information with the study team to assess eligibility.   Thank you   Robbi Blanch, Pharm.D [place] [facility] [address], [place], [place] [address] Phone: [phone]; Fax: [phone]

## 2023-10-28 DIAGNOSIS — E781 Pure hyperglyceridemia: Secondary | ICD-10-CM | POA: Diagnosis not present

## 2023-11-06 ENCOUNTER — Other Ambulatory Visit (HOSPITAL_COMMUNITY): Payer: Self-pay

## 2023-11-06 ENCOUNTER — Telehealth: Payer: Self-pay | Admitting: Cardiology

## 2023-11-06 MED ORDER — NEXLIZET 180-10 MG PO TABS
1.0000 | ORAL_TABLET | Freq: Every day | ORAL | 2 refills | Status: AC
  Filled 2023-11-06: qty 90, 90d supply, fill #0
  Filled 2024-01-29: qty 30, 30d supply, fill #1

## 2023-11-06 MED ORDER — ICOSAPENT ETHYL 1 G PO CAPS
2.0000 g | ORAL_CAPSULE | Freq: Two times a day (BID) | ORAL | 2 refills | Status: AC
  Filled 2023-11-06 – 2023-11-20 (×2): qty 360, 90d supply, fill #0
  Filled 2024-01-29 – 2024-02-03 (×2): qty 360, 90d supply, fill #1

## 2023-11-06 NOTE — Telephone Encounter (Signed)
*  STAT* If patient is at the pharmacy, call can be transferred to refill team.   1. Which medications need to be refilled? (please list name of each medication and dose if known) Bempedoic Acid -Ezetimibe  (NEXLIZET ) 180-10 MG TABS  icosapent  Ethyl (VASCEPA ) 1 g capsule    2. Would you like to learn more about the convenience, safety, & potential cost savings by using the [facility]?    3. Are you open to using the [facility] (Type [facility].  ).   4. Which pharmacy/location (including street and city if local pharmacy) is medication to be sent to? [place] [facility]    5. Do they need a 30 day or 90 day supply? 90 day

## 2023-11-06 NOTE — Telephone Encounter (Signed)
 RX sent in

## 2023-11-20 ENCOUNTER — Other Ambulatory Visit (HOSPITAL_COMMUNITY): Payer: Self-pay

## 2023-12-01 DIAGNOSIS — E785 Hyperlipidemia, unspecified: Secondary | ICD-10-CM | POA: Diagnosis not present

## 2023-12-01 LAB — LIPID PANEL
Chol/HDL Ratio: 2.5 ratio (ref 0.0–5.0)
Cholesterol, Total: 93 mg/dL — ABNORMAL LOW (ref 100–199)
HDL: 37 mg/dL — ABNORMAL LOW (ref >39)
LDL Chol Calc (NIH): 11 mg/dL (ref 0–99)
Triglycerides: 318 mg/dL — ABNORMAL HIGH (ref 0–149)
VLDL Cholesterol Cal: 45 mg/dL — ABNORMAL HIGH (ref 5–40)

## 2023-12-02 ENCOUNTER — Ambulatory Visit: Payer: Self-pay | Admitting: Pharmacist

## 2023-12-02 NOTE — Telephone Encounter (Signed)
 Lipid lab discussed over the phone. TG went down but still above the goal. Patient made significant changes to his diet admits there is still room for improvement. He wants to work on it will check TG late January 2026.

## 2023-12-02 NOTE — Telephone Encounter (Signed)
 Call to discuss lab result - patient was busy so will call back later today

## 2023-12-14 ENCOUNTER — Other Ambulatory Visit (HOSPITAL_COMMUNITY): Payer: Self-pay

## 2023-12-14 ENCOUNTER — Other Ambulatory Visit: Payer: Self-pay | Admitting: Pharmacist

## 2023-12-14 MED ORDER — CARVEDILOL 25 MG PO TABS
25.0000 mg | ORAL_TABLET | Freq: Two times a day (BID) | ORAL | 3 refills | Status: AC
  Filled 2023-12-14: qty 180, 90d supply, fill #0

## 2023-12-15 ENCOUNTER — Other Ambulatory Visit (HOSPITAL_COMMUNITY): Payer: Self-pay

## 2023-12-15 MED ORDER — FENOFIBRATE 160 MG PO TABS
160.0000 mg | ORAL_TABLET | Freq: Every day | ORAL | 3 refills | Status: AC
  Filled 2023-12-15: qty 90, 90d supply, fill #0

## 2023-12-15 NOTE — Addendum Note (Signed)
 Addended by: Kaid Seeberger K on: 12/15/2023 07:55 AM   Modules accepted: Orders

## 2023-12-25 ENCOUNTER — Other Ambulatory Visit: Payer: Self-pay | Admitting: Neurosurgery

## 2023-12-25 DIAGNOSIS — I609 Nontraumatic subarachnoid hemorrhage, unspecified: Secondary | ICD-10-CM

## 2023-12-28 ENCOUNTER — Ambulatory Visit: Payer: Medicare HMO

## 2023-12-28 DIAGNOSIS — I255 Ischemic cardiomyopathy: Secondary | ICD-10-CM | POA: Diagnosis not present

## 2023-12-29 LAB — CUP PACEART REMOTE DEVICE CHECK
Battery Remaining Longevity: 106 mo
Battery Remaining Percentage: 86 %
Battery Voltage: 3.02 V
Brady Statistic RV Percent Paced: 1 %
Date Time Interrogation Session: 20251215020626
HighPow Impedance: 60 Ohm
Implantable Lead Connection Status: 753985
Implantable Lead Implant Date: 20121227
Implantable Lead Location: 753860
Implantable Lead Model: 7122
Implantable Pulse Generator Implant Date: 20240617
Lead Channel Impedance Value: 380 Ohm
Lead Channel Pacing Threshold Amplitude: 1 V
Lead Channel Pacing Threshold Pulse Width: 0.5 ms
Lead Channel Sensing Intrinsic Amplitude: 11.5 mV
Lead Channel Setting Pacing Amplitude: 2.5 V
Lead Channel Setting Pacing Pulse Width: 0.5 ms
Lead Channel Setting Sensing Sensitivity: 0.5 mV
Pulse Gen Serial Number: 111072390
Zone Setting Status: 755011

## 2023-12-31 ENCOUNTER — Telehealth: Payer: Self-pay | Admitting: Pharmacy Technician

## 2023-12-31 ENCOUNTER — Other Ambulatory Visit (HOSPITAL_COMMUNITY): Payer: Self-pay

## 2023-12-31 NOTE — Telephone Encounter (Signed)
 Plan cancelled: renewed automatically 10/14/23-01/12/25

## 2023-12-31 NOTE — Telephone Encounter (Signed)
 Pharmacy Patient Advocate Encounter   Received notification from CoverMyMeds that prior authorization for Repatha  is required/requested.   Insurance verification completed.   The patient is insured through [employer].   Per test claim: PA required; PA submitted to above mentioned insurance via Latent Key/confirmation #/EOC [id number] Status is pending

## 2024-01-01 ENCOUNTER — Ambulatory Visit: Payer: Self-pay | Admitting: Internal Medicine

## 2024-01-01 NOTE — Progress Notes (Signed)
 Remote ICD Transmission

## 2024-01-26 ENCOUNTER — Other Ambulatory Visit: Payer: Self-pay

## 2024-01-26 ENCOUNTER — Ambulatory Visit (HOSPITAL_COMMUNITY)
Admission: RE | Admit: 2024-01-26 | Discharge: 2024-01-26 | Disposition: A | Source: Ambulatory Visit | Attending: Neurosurgery

## 2024-01-26 ENCOUNTER — Other Ambulatory Visit: Payer: Self-pay | Admitting: Neurosurgery

## 2024-01-26 DIAGNOSIS — F17210 Nicotine dependence, cigarettes, uncomplicated: Secondary | ICD-10-CM | POA: Diagnosis not present

## 2024-01-26 DIAGNOSIS — I609 Nontraumatic subarachnoid hemorrhage, unspecified: Secondary | ICD-10-CM

## 2024-01-26 DIAGNOSIS — Z8679 Personal history of other diseases of the circulatory system: Secondary | ICD-10-CM | POA: Diagnosis not present

## 2024-01-26 DIAGNOSIS — I671 Cerebral aneurysm, nonruptured: Secondary | ICD-10-CM | POA: Diagnosis present

## 2024-01-26 HISTORY — PX: IR ANGIO VERTEBRAL SEL VERTEBRAL UNI R MOD SED: IMG5368

## 2024-01-26 HISTORY — PX: IR ANGIO INTRA EXTRACRAN SEL INTERNAL CAROTID BILAT MOD SED: IMG5363

## 2024-01-26 MED ORDER — CEFAZOLIN SODIUM-DEXTROSE 2-4 GM/100ML-% IV SOLN: 2.0000 g | INTRAVENOUS | Status: DC

## 2024-01-26 MED ORDER — IOHEXOL 300 MG/ML  SOLN: 100.0000 mL | Freq: Once | INTRAMUSCULAR | Status: DC | PRN

## 2024-01-26 MED ORDER — HEPARIN SODIUM (PORCINE) 1000 UNIT/ML IJ SOLN
INTRAMUSCULAR | Status: AC | PRN
  Administered 2024-01-26: 2000 [IU] via INTRAVENOUS

## 2024-01-26 MED ORDER — FENTANYL CITRATE (PF) 100 MCG/2ML IJ SOLN
INTRAMUSCULAR | Status: AC | PRN
  Administered 2024-01-26: 25 ug via INTRAVENOUS

## 2024-01-26 MED ORDER — MIDAZOLAM HCL (PF) 2 MG/2ML IJ SOLN
INTRAMUSCULAR | Status: AC | PRN
  Administered 2024-01-26: 1 mg via INTRAVENOUS

## 2024-01-26 MED ORDER — HEPARIN SODIUM (PORCINE) 1000 UNIT/ML IJ SOLN
INTRAMUSCULAR | Status: AC
  Filled 2024-01-26: qty 10

## 2024-01-26 MED ORDER — MIDAZOLAM HCL 2 MG/2ML IJ SOLN
INTRAMUSCULAR | Status: AC
  Filled 2024-01-26: qty 2

## 2024-01-26 MED ORDER — SODIUM CHLORIDE 0.9 % IV SOLN: INTRAVENOUS | Status: DC

## 2024-01-26 MED ORDER — FENTANYL CITRATE (PF) 100 MCG/2ML IJ SOLN
INTRAMUSCULAR | Status: AC
  Filled 2024-01-26: qty 2

## 2024-01-26 MED ORDER — LIDOCAINE HCL 1 % IJ SOLN
INTRAMUSCULAR | Status: AC
  Filled 2024-01-26: qty 20

## 2024-01-26 MED ORDER — HYDROCODONE-ACETAMINOPHEN 5-325 MG PO TABS: 1.0000 | ORAL_TABLET | ORAL | Status: DC | PRN

## 2024-01-26 NOTE — Op Note (Signed)
 " ENDOVASCULAR NEUROSURGERY OPERATIVE NOTE   PROCEDURE: Diagnostic Cerebral Angiogram   SURGEON:   Dr. Gerldine Maizes, MD  HISTORY:   The patient is a 69 y.o. 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  Patient has been in excellent neurologic recovery.  He presents today for routine long-term angiographic follow-up.  APPROACH:   The technical aspects of the procedure as well as its potential risks and benefits were reviewed with the patient. These risks included but were not limited bleeding, infection, allergic reaction, damage to organs/vital structures, stroke, non-diagnostic procedure, and the catastrophic outcomes of heart attack, coma, and death. With an understanding of these risks, informed consent was obtained and witnessed.    The patient was placed in the supine position on the angiography table and the skin of right wrist and groin prepped in the usual sterile fashion. The procedure was performed under local anesthesia (1%-solution of bicarbonate-bufferred Lidoacaine) and conscious sedation with Versed  and fentanyl  monitored by the in-suite nurse and myself, including non-invasive blood pressure and continuous pulse oxymetry.    Access to the right common femoral artery was obtained using a micropuncture needle.  A short 5 French sheath was placed using standard Seldinger technique.  HEPARIN :  2000 Units total.    CONTRAST AGENT:  See IR records   FLUOROSCOPY TIME:  See IR records    CATHETER(S) AND WIRE(S):    5-French JB-1 glidecatheter   0.035 glidewire    VESSELS CATHETERIZED:   Right internal carotid   Left internal carotid   Right vertebral   Right common femoral  VESSELS STUDIED:   Right internal carotid, head Left internal carotid, head Right vertebral Right femoral  PROCEDURAL NARRATIVE:   A 5-Fr JB-1 terumo glide catheter was advanced over  a 0.035 glidewire into the aortic arch. The above vessels were then sequentially catheterized and cervical/cerebral angiograms taken. After review of images, the catheter was removed without incident.    INTERPRETATION:   Right internal carotid, head:   Injection reveals the presence of a widely patent ICA, M1, and A1 segments and their branches. No aneurysms, arteriovenous malformations, or high flow fistulas are visualized.  The parenchymal and venous phases are unremarkable. The venous sinuses are widely patent.    Left internal carotid, head:   Injection reveals the presence of a widely patent ICA, A1, and M1 segments and their branches. No aneurysms, arteriovenous malformations, or high flow fistulas are visualized.  The parenchymal and venous phases are unremarkable. The venous sinuses are widely patent.    Right vertebral:   Injection reveals the presence of a widely patent vertebral artery. This leads to a widely patent basilar artery that terminates in bilateral P1. Coil mass is seen at the basilar apex.  There is a small neck residual, eccentric to the right.  In comparison to the previous angiogram of 2024 this appears to be largely stable in size and morphology.  Capillary phase does not demonstrate any perfusion deficits.  Venous sinuses are patent.  Right femoral:    Normal vessel. No significant atherosclerotic disease. Arterial sheath in adequate position.   DISPOSITION:  Upon completion of the study, the sheath was removed and hemostasis obtained using a 5-Fr Exoseal closure device. Good proximal and distal extremity pulses were documented upon achievement of hemostasis. The procedure was well tolerated and no early complications were observed.  The patient was transferred to the recovery  area to be positioned flat in bed for 3 hours.    IMPRESSION:  1.  Stable appearance of small neck residual of a previously coiled basilar apex aneurysm. 2.  No new aneurysms, arteriovenous  malformations, or fistulas are identified.    Gerldine Maizes, MD [facility]   "

## 2024-01-26 NOTE — Discharge Instructions (Signed)
 Femoral Site Care This sheet gives you information about how to care for yourself after your procedure. Your health care provider may also give you more specific instructions. If you have problems or questions, contact your health care provider. What can I expect after the procedure?  After the procedure, it is common to have: Bruising that usually fades within 1-2 weeks. Tenderness at the site. Follow these instructions at home: Wound care Follow instructions from your health care provider about how to take care of your insertion site. Make sure you: Wash your hands with soap and water before you change your bandage (dressing). If soap and water are not available, use hand sanitizer. Remove your dressing as told by your health care provider. In 24 hours Do not take baths, swim, or use a hot tub until your health care provider approves. You may shower 24-48 hours after the procedure or as told by your health care provider. Gently wash the site with plain soap and water. Pat the area dry with a clean towel. Do not rub the site. This may cause bleeding. Do not apply powder or lotion to the site. Keep the site clean and dry. Check your femoral site every day for signs of infection. Check for: Redness, swelling, or pain. Fluid or blood. Warmth. Pus or a bad smell. Activity For the first 2-3 days after your procedure, or as long as directed: Avoid climbing stairs as much as possible. Do not squat. Do not lift anything that is heavier than 10 lb (4.5 kg), or the limit that you are told, until your health care provider says that it is safe. For 5 days Rest as directed. Avoid sitting for a long time without moving. Get up to take short walks every 1-2 hours. Do not drive for 24 hours if you were given a medicine to help you relax (sedative). General instructions Take over-the-counter and prescription medicines only as told by your health care provider. Keep all follow-up visits as told by  your health care provider. This is important. Contact a health care provider if you have: A fever or chills. You have redness, swelling, or pain around your insertion site. Get help right away if: The catheter insertion area swells very fast. You pass out. You suddenly start to sweat or your skin gets clammy. The catheter insertion area is bleeding, and the bleeding does not stop when you hold steady pressure on the area. The area near or just beyond the catheter insertion site becomes pale, cool, tingly, or numb. These symptoms may represent a serious problem that is an emergency. Do not wait to see if the symptoms will go away. Get medical help right away. Call your local emergency services (911 in the [place] Do not drive yourself to the hospital. Summary After the procedure, it is common to have bruising that usually fades within 1-2 weeks. Check your femoral site every day for signs of infection. Do not lift anything that is heavier than 10 lb (4.5 kg), or the limit that you are told, until your health care provider says that it is safe. This information is not intended to replace advice given to you by your health care provider. Make sure you discuss any questions you have with your health care provider. Document Revised: 01/12/2017 Document Reviewed: 01/12/2017 Elsevier Patient Education  2020 [employer].

## 2024-01-26 NOTE — Sedation Documentation (Signed)
 Patient transported back to short post arteriogram. Kayla RN at the bedside to receive patient handoff. Right groin assessed. Clean, dry and intact. No drainage noted from dressing. Soft to palpation, no hematoma noted. +2 distal pulses intact via palpation.

## 2024-01-26 NOTE — H&P (Signed)
 " Chief Complaint   Aneurysm  History of Present Illness  Joe [name] is a 69 y.o. 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  The patient has made an excellent neurologic recovery.  His short-term angiogram revealed a very small neck recanalization which we elected to monitor.  He comes in today for long-term angiographic follow-up.  Past Medical History   Past Medical History:  Diagnosis Date   Anemia    Chronic systolic CHF (congestive heart failure), NYHA class 1 (HCC)    EF 37% by nuclear stress test   Coronary artery disease    s/p PCI mild LAD (BMS) in setting of AWMI complicated by Vfib arrest with 40-50% residual disesae in left circ   History of kidney stones    Hyperlipidemia    Hypertension    Ischemic cardiomyopathy    A. 01/09/2011 - s/p St. Jude Fortify ST [employer] [place] AICD   Myocardial infarction (HCC)    OSA (obstructive sleep apnea)    severe with AHI 26/hr intolerant to CPAP   Pneumonia    Presence of permanent cardiac pacemaker    VF (ventricular fibrillation) (HCC)    arrest in setting of AMI 6/11    Past Surgical History   Past Surgical History:  Procedure Laterality Date   BACK SURGERY     BACK SURGERY     ICD GENERATOR CHANGEOUT N/A 06/30/2022   Procedure: ICD GENERATOR CHANGEOUT;  Surgeon: Waddell Danelle ORN, MD;  Location: [facility] INVASIVE CV LAB;  Service: Cardiovascular;  Laterality: N/A;   IMPLANTABLE CARDIOVERTER DEFIBRILLATOR IMPLANT N/A 01/09/2011   Procedure: IMPLANTABLE CARDIOVERTER DEFIBRILLATOR IMPLANT;  Surgeon: Danelle ORN Waddell, MD;  Location: [facility] CATH LAB;  Service: Cardiovascular;  Laterality: N/A;   IR ANGIO INTRA EXTRACRAN SEL INTERNAL CAROTID BILAT MOD SED  01/26/2022   IR ANGIO VERTEBRAL SEL VERTEBRAL UNI R MOD SED  01/26/2022   IR ANGIO VERTEBRAL SEL VERTEBRAL UNI R MOD SED  07/22/2022   IR ANGIOGRAM FOLLOW UP STUDY  01/26/2022   IR NEURO  EACH ADD'L AFTER BASIC UNI LEFT (MS)  01/26/2022   IR TRANSCATH/EMBOLIZ  01/26/2022   IR US  GUIDE VASC ACCESS RIGHT  07/22/2022   KNEE ARTHROSCOPY     right   LAPAROSCOPIC REVISION VENTRICULAR-PERITONEAL (V-P) SHUNT N/A 04/22/2022   Procedure: LAPAROSCOPIC VENTRICULAR-PERITONEAL (V-P) SHUNT;  Surgeon: Lyndel Deward PARAS, MD;  Location: [facility] OR;  Service: General;  Laterality: N/A;   PACEMAKER INSERTION  2012   St Jude   RADIOLOGY WITH ANESTHESIA N/A 01/26/2022   Procedure: IR WITH ANESTHESIA;  Surgeon: Lanis Pupa, MD;  Location: [facility] OR;  Service: Radiology;  Laterality: N/A;   VENTRICULOPERITONEAL SHUNT N/A 04/22/2022   Procedure: Laparoscopic assisted VP shunt placement;  Surgeon: Lanis Pupa, MD;  Location: [facility] OR;  Service: Neurosurgery;  Laterality: N/A;  RM 47    Social History  Social History[1]  Medications   Prior to Admission medications  Medication Sig Start Date End Date Taking? Authorizing Provider  aspirin  81 MG chewable tablet Chew 81 mg by mouth daily.   Yes [provider]  Bempedoic Acid -Ezetimibe  (NEXLIZET ) 180-10 MG TABS Take 1 tablet by mouth daily. 11/06/23  Yes Turner, Wilbert JONELLE, MD  carvedilol  (COREG ) 25 MG tablet Take 1 tablet (25 mg total) by mouth 2 (two) times daily with a meal. 12/14/23  Yes Turner, Traci R, MD  empagliflozin  (JARDIANCE ) 10 MG TABS  tablet Take 1 tablet (10 mg total) by mouth daily before breakfast. 06/10/23  Yes Turner, Wilbert SAUNDERS, MD  empagliflozin  (JARDIANCE ) 10 MG TABS tablet Take 1 tablet (10 mg total) by mouth daily before breakfast. 09/18/23  Yes Turner, Wilbert SAUNDERS, MD  Evolocumab  (REPATHA  SURECLICK) 140 MG/ML SOAJ Inject 140 mg into the skin every 14 (fourteen) days. 09/18/23  Yes Turner, Wilbert SAUNDERS, MD  fenofibrate  160 MG tablet Take 1 tablet (160 mg total) by mouth daily. 12/15/23  Yes Turner, Wilbert SAUNDERS, MD  icosapent  Ethyl (VASCEPA ) 1 g capsule Take 2 capsules (2 g total) by mouth 2 (two) times daily. 11/06/23  Yes Turner, Wilbert SAUNDERS, MD   nitroGLYCERIN  (NITROSTAT ) 0.4 MG SL tablet Place 1 tablet (0.4 mg total) under the tongue every 5 (five) minutes as needed for chest pain. Up to 3 doses 11/12/21  Yes Leverne Charlies Helling, PA-C  sacubitril -valsartan  (ENTRESTO ) 49-51 MG Take 1 tablet by mouth 2 (two) times daily. 09/18/23  Yes Turner, Wilbert SAUNDERS, MD  spironolactone  (ALDACTONE ) 25 MG tablet Take 0.5 tablets (12.5 mg total) by mouth daily. 09/18/23 03/16/24 Yes Turner, Wilbert SAUNDERS, MD  acetaminophen  (TYLENOL ) 325 MG tablet Take 2 tablets (650 mg total) by mouth every 6 (six) hours as needed for mild pain (or temp > 37.5 C (99.5 F)). 02/07/22   Gillie Duncans, MD  Multiple Vitamins-Minerals (MENS MULTIVITAMIN) TABS Take 1 tablet by mouth daily.    [provider]    Allergies  Allergies[2]  Review of Systems  ROS  Neurologic Exam  Awake, alert, oriented Memory and concentration grossly intact Speech fluent, appropriate CN grossly intact Motor exam: Upper Extremities Deltoid Bicep Tricep Grip  Right 5/5 5/5 5/5 5/5  Left 5/5 5/5 5/5 5/5   Lower Extremities IP Quad PF DF EHL  Right 5/5 5/5 5/5 5/5 5/5  Left 5/5 5/5 5/5 5/5 5/5   Sensation grossly intact to LT    Impression  - 69 y.o. male approximately 2 years status post subarachnoid hemorrhage and coil embolization of a ruptured basilar apex aneurysm.  Previous short-term angiogram revealed small neck recanalization.  Plan  - We will proceed with long-term angiographic follow-up  I have reviewed the indications for the procedure as well as the details of the procedure and the expected postoperative course and recovery at length with the patient in the office. We have also reviewed in detail the risks, benefits, and alternatives to the procedure. All questions were answered and Joe [name] provided informed consent to proceed.  Gerldine Maizes, MD [facility]       [1]  Social History Tobacco Use   Smoking status: Every Day     Current packs/day: 0.00    Average packs/day: 0.3 packs/day for 34.0 years (8.5 ttl pk-yrs)    Types: Cigarettes    Start date: 06/14/1975    Last attempt to quit: 06/13/2009    Years since quitting: 14.6   Smokeless tobacco: Never   Tobacco comments:    Socially  Vaping Use   Vaping status: Never Used  Substance Use Topics   Alcohol use: Yes    Comment: occasional   Drug use: No  [2]  Allergies Allergen Reactions   Isosorbide Dinitrate Other (See Comments)    headache   Rosuvastatin  Other (See Comments)    Muscle aches on 5 mg qd and 20 mg qd    Atorvastatin Anxiety    Made his irritable and anxious   "

## 2024-01-26 NOTE — Progress Notes (Signed)
 PT/INR, CBC, BMP rejected by lab. No redraw per Dr. Lanis.

## 2024-01-26 NOTE — Sedation Documentation (Signed)
 5 fr Exoseal to right groin, holding manual pressure an additional 5 min

## 2024-01-28 ENCOUNTER — Encounter (HOSPITAL_COMMUNITY): Payer: Self-pay

## 2024-01-29 ENCOUNTER — Other Ambulatory Visit (HOSPITAL_COMMUNITY): Payer: Self-pay

## 2024-01-29 ENCOUNTER — Other Ambulatory Visit: Payer: Self-pay

## 2024-02-01 ENCOUNTER — Other Ambulatory Visit (HOSPITAL_COMMUNITY): Payer: Self-pay

## 2024-02-03 ENCOUNTER — Other Ambulatory Visit (HOSPITAL_COMMUNITY): Payer: Self-pay

## 2024-02-03 ENCOUNTER — Other Ambulatory Visit: Payer: Self-pay

## 2024-02-15 ENCOUNTER — Other Ambulatory Visit (HOSPITAL_COMMUNITY): Payer: Self-pay
